# Patient Record
Sex: Female | Born: 1955 | Race: Black or African American | Hispanic: No | Marital: Single | Smoking: Former smoker
Health system: Southern US, Community
[De-identification: ages and names within clinical notes are randomized; demographics above are authoritative.]

## PROBLEM LIST (undated history)

## (undated) DIAGNOSIS — R059 Cough, unspecified: Secondary | ICD-10-CM

## (undated) DIAGNOSIS — R05 Cough: Secondary | ICD-10-CM

## (undated) DIAGNOSIS — S83209A Unspecified tear of unspecified meniscus, current injury, unspecified knee, initial encounter: Secondary | ICD-10-CM

## (undated) DIAGNOSIS — K219 Gastro-esophageal reflux disease without esophagitis: Secondary | ICD-10-CM

## (undated) DIAGNOSIS — I10 Essential (primary) hypertension: Secondary | ICD-10-CM

## (undated) DIAGNOSIS — T8859XA Other complications of anesthesia, initial encounter: Secondary | ICD-10-CM

## (undated) DIAGNOSIS — E041 Nontoxic single thyroid nodule: Secondary | ICD-10-CM

## (undated) DIAGNOSIS — M1990 Unspecified osteoarthritis, unspecified site: Secondary | ICD-10-CM

## (undated) DIAGNOSIS — E785 Hyperlipidemia, unspecified: Secondary | ICD-10-CM

## (undated) DIAGNOSIS — F419 Anxiety disorder, unspecified: Secondary | ICD-10-CM

## (undated) DIAGNOSIS — F329 Major depressive disorder, single episode, unspecified: Secondary | ICD-10-CM

## (undated) DIAGNOSIS — F32A Depression, unspecified: Secondary | ICD-10-CM

## (undated) HISTORY — DX: Unspecified tear of unspecified meniscus, current injury, unspecified knee, initial encounter: S83.209A

## (undated) HISTORY — DX: Unspecified osteoarthritis, unspecified site: M19.90

## (undated) HISTORY — DX: Hyperlipidemia, unspecified: E78.5

## (undated) HISTORY — PX: TONSILLECTOMY: SUR1361

## (undated) HISTORY — DX: Essential (primary) hypertension: I10

## (undated) HISTORY — DX: Depression, unspecified: F32.A

## (undated) HISTORY — DX: Cough: R05

## (undated) HISTORY — DX: Cough, unspecified: R05.9

## (undated) HISTORY — DX: Nontoxic single thyroid nodule: E04.1

## (undated) HISTORY — DX: Major depressive disorder, single episode, unspecified: F32.9

## (undated) HISTORY — DX: Anxiety disorder, unspecified: F41.9

---

## 2012-12-17 HISTORY — PX: BREAST EXCISIONAL BIOPSY: SUR124

## 2015-09-17 HISTORY — PX: BREAST BIOPSY: SHX20

## 2016-04-27 DIAGNOSIS — H16229 Keratoconjunctivitis sicca, not specified as Sjogren's, unspecified eye: Secondary | ICD-10-CM | POA: Insufficient documentation

## 2016-04-27 DIAGNOSIS — H5213 Myopia, bilateral: Secondary | ICD-10-CM | POA: Insufficient documentation

## 2016-04-27 DIAGNOSIS — H43813 Vitreous degeneration, bilateral: Secondary | ICD-10-CM | POA: Insufficient documentation

## 2016-04-27 DIAGNOSIS — H2511 Age-related nuclear cataract, right eye: Secondary | ICD-10-CM | POA: Insufficient documentation

## 2016-10-18 ENCOUNTER — Other Ambulatory Visit: Payer: Self-pay | Admitting: *Deleted

## 2016-10-18 ENCOUNTER — Ambulatory Visit
Admission: RE | Admit: 2016-10-18 | Discharge: 2016-10-18 | Disposition: A | Discharge status: Home / Self Care | Payer: Self-pay | Source: Ambulatory Visit | Attending: *Deleted | Admitting: *Deleted

## 2016-10-18 ENCOUNTER — Other Ambulatory Visit: Payer: Self-pay | Admitting: Internal Medicine

## 2016-10-18 DIAGNOSIS — Z1231 Encounter for screening mammogram for malignant neoplasm of breast: Secondary | ICD-10-CM

## 2016-11-20 DIAGNOSIS — R2689 Other abnormalities of gait and mobility: Secondary | ICD-10-CM | POA: Insufficient documentation

## 2016-11-20 DIAGNOSIS — G500 Trigeminal neuralgia: Secondary | ICD-10-CM | POA: Insufficient documentation

## 2016-11-22 ENCOUNTER — Ambulatory Visit
Admission: RE | Admit: 2016-11-22 | Discharge: 2016-11-22 | Disposition: A | Discharge status: Home / Self Care | Payer: BLUE CROSS/BLUE SHIELD | Source: Ambulatory Visit | Attending: Internal Medicine | Admitting: Internal Medicine

## 2016-11-22 DIAGNOSIS — Z1231 Encounter for screening mammogram for malignant neoplasm of breast: Secondary | ICD-10-CM | POA: Insufficient documentation

## 2016-12-12 ENCOUNTER — Other Ambulatory Visit: Payer: Self-pay | Admitting: Internal Medicine

## 2016-12-12 DIAGNOSIS — R1310 Dysphagia, unspecified: Secondary | ICD-10-CM

## 2016-12-20 ENCOUNTER — Ambulatory Visit: Payer: BLUE CROSS/BLUE SHIELD

## 2016-12-24 ENCOUNTER — Ambulatory Visit
Admission: RE | Admit: 2016-12-24 | Discharge: 2016-12-24 | Disposition: A | Discharge status: Home / Self Care | Payer: BLUE CROSS/BLUE SHIELD | Source: Ambulatory Visit | Attending: Internal Medicine | Admitting: Internal Medicine

## 2016-12-24 DIAGNOSIS — R1310 Dysphagia, unspecified: Secondary | ICD-10-CM | POA: Insufficient documentation

## 2016-12-24 DIAGNOSIS — K449 Diaphragmatic hernia without obstruction or gangrene: Secondary | ICD-10-CM | POA: Insufficient documentation

## 2017-01-22 ENCOUNTER — Other Ambulatory Visit: Payer: Self-pay | Admitting: Obstetrics and Gynecology

## 2017-01-22 DIAGNOSIS — N83201 Unspecified ovarian cyst, right side: Secondary | ICD-10-CM

## 2017-01-22 DIAGNOSIS — D259 Leiomyoma of uterus, unspecified: Secondary | ICD-10-CM

## 2017-01-22 DIAGNOSIS — R9389 Abnormal findings on diagnostic imaging of other specified body structures: Secondary | ICD-10-CM

## 2017-01-25 ENCOUNTER — Ambulatory Visit
Admission: RE | Admit: 2017-01-25 | Discharge: 2017-01-25 | Disposition: A | Discharge status: Home / Self Care | Payer: BLUE CROSS/BLUE SHIELD | Source: Ambulatory Visit | Attending: Obstetrics and Gynecology | Admitting: Obstetrics and Gynecology

## 2017-01-25 DIAGNOSIS — N83201 Unspecified ovarian cyst, right side: Secondary | ICD-10-CM | POA: Diagnosis not present

## 2017-01-25 DIAGNOSIS — R938 Abnormal findings on diagnostic imaging of other specified body structures: Secondary | ICD-10-CM | POA: Diagnosis present

## 2017-01-25 DIAGNOSIS — N83202 Unspecified ovarian cyst, left side: Secondary | ICD-10-CM | POA: Insufficient documentation

## 2017-01-25 DIAGNOSIS — D259 Leiomyoma of uterus, unspecified: Secondary | ICD-10-CM | POA: Diagnosis present

## 2017-01-25 DIAGNOSIS — R9389 Abnormal findings on diagnostic imaging of other specified body structures: Secondary | ICD-10-CM

## 2017-02-20 ENCOUNTER — Telehealth: Payer: Self-pay | Admitting: Obstetrics and Gynecology

## 2017-02-20 ENCOUNTER — Ambulatory Visit (INDEPENDENT_AMBULATORY_CARE_PROVIDER_SITE_OTHER): Payer: BLUE CROSS/BLUE SHIELD | Admitting: Obstetrics and Gynecology

## 2017-02-20 ENCOUNTER — Encounter: Payer: Self-pay | Admitting: Obstetrics and Gynecology

## 2017-02-20 VITALS — BP 128/74 | Ht 60.0 in | Wt 151.0 lb

## 2017-02-20 DIAGNOSIS — D259 Leiomyoma of uterus, unspecified: Secondary | ICD-10-CM | POA: Insufficient documentation

## 2017-02-20 DIAGNOSIS — D219 Benign neoplasm of connective and other soft tissue, unspecified: Secondary | ICD-10-CM | POA: Diagnosis not present

## 2017-02-20 DIAGNOSIS — N83201 Unspecified ovarian cyst, right side: Secondary | ICD-10-CM | POA: Diagnosis not present

## 2017-02-20 DIAGNOSIS — N83202 Unspecified ovarian cyst, left side: Secondary | ICD-10-CM

## 2017-02-20 NOTE — Telephone Encounter (Signed)
Patient is scheduled at [facility] on Tuesday, 05/14/17 @ 2:30pm, arrive at [facility] @ 2:00pm, begin drinking 32 oz of water at 1:30pm and arrive w/ a full bladder. Lmtrc.

## 2017-02-20 NOTE — Progress Notes (Signed)
Gynecology Ultrasound Follow Up  Chief Complaint:  Chief Complaint  Patient presents with  . Follow-up    U/S Follow Up for Ovarian Cyst   History of Present Illness: Patient is a 61 y.o. female who presents today for ultrasound evaluation of her ovarian cyst and uterine fibroids.  Ultrasound demonstrates the following findgins Adnexa: cyst seen  Right ovary: 8.4 x 8.7 x 8.4 cm simple appearing cyst  Left ovary: 4.3 x 2.9 x 3.8 cm simple appearing cyst Uterus: several fibroids, largest is 8.0 x 7.1 x 6.3 cm.  Several other 2-3cm fibroids mentioned in report.  Historical (from records reviewed): 07/17/13: Ultrasound report  largest fibroid 8 x 5.6 x 7.1 cm  Simple cyst noted (laterality not specified): 7.3 cm 11/10/13: ultrasound report  Fibroids: 4.6 x 5.4 cm, 1.8 x 2.0 xcm, 5.4 x 6.8 cm  No cysts mentioned 11/26/2016: CT report  Fibroid: 7.7 x 6.7 x 7 cm  Cyst (possibly from right ovary): 7.5 x 7.5 x 7.7 cm  No new symptoms  Review of Systems: Review of Systems  Constitutional: Negative.   HENT: Negative.   Eyes: Negative.   Respiratory: Negative.   Cardiovascular: Negative.   Gastrointestinal: Negative.   Genitourinary: Negative.   Musculoskeletal: Negative.   Skin: Negative.   Neurological: Negative.   Psychiatric/Behavioral: Negative.    Past Medical History:  Diagnosis Date  . Anxiety   . Hyperlipidemia   . Hypertension    Past Surgical History:  Procedure Laterality Date  . BREAST BIOPSY Left 09/2015   [place] core bx  NEG   . BREAST BIOPSY Right    both core and excisional done, multiples  . BREAST BIOPSY Left 2014  . CESAREAN SECTION    . TONSILLECTOMY     No LMP recorded. Patient is postmenopausal. Contraception: n/a  Family History  Problem Relation Age of Onset  . Diabetes Maternal Aunt   . Hypertension Maternal Aunt   . Breast cancer Neg Hx    Social History   Social History  . Marital status: Single    Spouse name: N/A  . Number of  children: N/A  . Years of education: N/A   Occupational History  . Not on file.   Social History Main Topics  . Smoking status: Never Smoker  . Smokeless tobacco: Never Used  . Alcohol use No  . Drug use: No  . Sexual activity: Not on file   Other Topics Concern  . Not on file   Social History Narrative  . No narrative on file   Allergies  Allergen Reactions  . Penicillins     Medications:   Medication Sig Start Date End Date Taking? Authorizing Provider  meclizine (ANTIVERT) 25 MG tablet TK 1 T PO BID TO TID PRF VERTIGO 04/19/16  Yes Historical Provider, MD  meloxicam (MOBIC) 15 MG tablet Meloxicam 15MG  Oral Tablet QTY: 30 tablet Days: 30 Refills: 3  Written: 09/21/16 Patient Instructions: once a day 09/21/16  Yes Historical Provider, MD  valACYclovir (VALTREX) 500 MG tablet Take by mouth. 01/19/16  Yes Historical Provider, MD  chlorthalidone (HYGROTON) 25 MG tablet TK 1 T PO  ONCE A DAY 01/14/17   Historical Provider, MD  enalapril (VASOTEC) 10 MG tablet Take by mouth.    Historical Provider, MD  gabapentin (NEURONTIN) 100 MG capsule TK 1 C PO BID 02/07/17   Historical Provider, MD  polyethylene glycol powder (GLYCOLAX/MIRALAX) powder USE UTD FOR COLONOSCOPY 01/21/17   Historical Provider, MD  pravastatin (PRAVACHOL) 40 MG tablet Take by mouth.    Historical Provider, MD  RESTASIS MULTIDOSE 0.05 % ophthalmic emulsion PLACE ONE GTT INTO BOTH EYES BID 12/13/16   Historical Provider, MD  sertraline (ZOLOFT) 100 MG tablet  01/15/17   Historical Provider, MD    Physical Exam BP 128/74   Ht 5' (1.524 m)   Wt 151 lb (68.5 kg)   BMI 29.49 kg/m   General: NAD HEENT: normocephalic, anicteric Pulmonary: No increased work of breathing Extremities: no edema, erythema, or tenderness Neurologic: Grossly intact, normal gait Psychiatric: mood appropriate, affect full  Assessment: 62 y.o. G1P0101 with large, fibroid uterus with multiple fibroids, ovarian cyst which appears  simple. Plan:  Discussed that overall, compared to studies from 2014, her uterine fibroids are essentially unchanged. We discussed that monitoring them at this point makes the most sense, especially given that she very much does not want to have surgery. Her cyst is slightly different. She has a history of ovarian cysts with sizes similar to recent studies. I am not able to tell whether the cyst she has currently are persistent or are new cyst. After discussing the risks and benefits of monitoring versus removing the cysts, mutual decision is made to repeat the ultrasound in 3 months with the understanding that I would recommend removal if they are persistent. She voiced understanding and agreement with this.  25 minutes spent in face to face discussion with > 50% spent in counseling and management of her uterine fibroids and ovarian cysts.   Prentice Docker, MD 02/20/2017 1:21 PM    CC: Lamonte Sakai, MD [place]

## 2017-02-20 NOTE — Telephone Encounter (Signed)
Patient returned the call and is aware of appointment details and instructions.

## 2017-02-27 ENCOUNTER — Telehealth: Payer: Self-pay | Admitting: Obstetrics and Gynecology

## 2017-02-27 NOTE — Telephone Encounter (Signed)
I'm confused.  If we're doing a six-month follow up from November, then having another u/s in May makes sense. When I talked with her in the clinic recently we discussed doing the ultrasound either 6 months from November or  6 months from February.  I would prefer that the ultrasound be 6 months from November because that is when we had the first imaging that showed the cyst.  I think it would be fine either way, though. I just want to be sure I am following her closely.   Please give her a call back and see if she prefers to move it to August.  Like I said, my preference is May.   Thanks,  Prentice Docker, MD

## 2017-02-27 NOTE — Telephone Encounter (Signed)
My records show two u/s ordered for May (abdominal and pelvic).  Both should be ordered.  One is a pelvis-complete (transabdominal) and transvaginal.  These are the same ultrasounds ordered for 2/9 and the results were what I needed. So, they should be the right ones.

## 2017-02-27 NOTE — Telephone Encounter (Signed)
May is correct since 6 mos from November. Her question is coming from having an abdominal u/s in November and we have her scheduled for a pelvic u/s in May.

## 2017-02-27 NOTE — Telephone Encounter (Signed)
Patient called to question whether the order for 05/14/17 pelvic u/s is correct. She said she had abdominal imaging last November and a pelvic u/s in February. She thought you mentioned a 6 mos f/u but that would be from the November imaging. Please contact the patient at [phone]. Please let me know if the order is changed so I can reschedule the patient.

## 2017-02-28 NOTE — Telephone Encounter (Signed)
Lmtrc

## 2017-02-28 NOTE — Telephone Encounter (Signed)
Patient returned the call and is aware of the tests ordered. Also, patient said she will be travelling and needs to reschedule the appointment for later that same week.

## 2017-03-01 NOTE — Telephone Encounter (Signed)
Lmtrc

## 2017-03-01 NOTE — Telephone Encounter (Signed)
Patient has been rescheduled for Friday, 05/17/17 @ 2:30pm with a 2:15pm arrival at the [facility]. Lmtrc.

## 2017-03-01 NOTE — Telephone Encounter (Signed)
Patient returned call and is aware of rescheduled appointment on Friday, 05/17/17 @ 2:30pm with a 2:15pm arrival at the [place], and to begin drinking 32 oz water 1 hr before the test and arrive with a full bladder.

## 2017-04-30 ENCOUNTER — Other Ambulatory Visit: Payer: Self-pay | Admitting: Neurology

## 2017-04-30 DIAGNOSIS — R2689 Other abnormalities of gait and mobility: Secondary | ICD-10-CM

## 2017-04-30 DIAGNOSIS — G500 Trigeminal neuralgia: Secondary | ICD-10-CM

## 2017-05-14 ENCOUNTER — Ambulatory Visit: Payer: BLUE CROSS/BLUE SHIELD

## 2017-05-17 ENCOUNTER — Ambulatory Visit: Payer: BLUE CROSS/BLUE SHIELD

## 2017-05-23 ENCOUNTER — Ambulatory Visit: Payer: BLUE CROSS/BLUE SHIELD

## 2017-05-24 ENCOUNTER — Ambulatory Visit: Payer: BLUE CROSS/BLUE SHIELD | Admitting: Obstetrics and Gynecology

## 2017-05-27 ENCOUNTER — Ambulatory Visit
Admission: RE | Admit: 2017-05-27 | Discharge: 2017-05-27 | Disposition: A | Discharge status: Home / Self Care | Payer: BLUE CROSS/BLUE SHIELD | Source: Ambulatory Visit | Attending: Obstetrics and Gynecology | Admitting: Obstetrics and Gynecology

## 2017-05-27 DIAGNOSIS — D219 Benign neoplasm of connective and other soft tissue, unspecified: Secondary | ICD-10-CM | POA: Diagnosis present

## 2017-05-27 DIAGNOSIS — N838 Other noninflammatory disorders of ovary, fallopian tube and broad ligament: Secondary | ICD-10-CM | POA: Insufficient documentation

## 2017-05-27 DIAGNOSIS — N83201 Unspecified ovarian cyst, right side: Secondary | ICD-10-CM | POA: Diagnosis present

## 2017-05-27 DIAGNOSIS — N83202 Unspecified ovarian cyst, left side: Secondary | ICD-10-CM | POA: Diagnosis not present

## 2017-05-28 ENCOUNTER — Ambulatory Visit: Payer: BLUE CROSS/BLUE SHIELD

## 2017-05-30 ENCOUNTER — Encounter: Payer: Self-pay | Admitting: Obstetrics and Gynecology

## 2017-05-30 ENCOUNTER — Ambulatory Visit (INDEPENDENT_AMBULATORY_CARE_PROVIDER_SITE_OTHER): Payer: BLUE CROSS/BLUE SHIELD | Admitting: Obstetrics and Gynecology

## 2017-05-30 VITALS — BP 128/84 | Ht 60.0 in | Wt 160.0 lb

## 2017-05-30 DIAGNOSIS — N83201 Unspecified ovarian cyst, right side: Secondary | ICD-10-CM | POA: Diagnosis not present

## 2017-05-30 DIAGNOSIS — D219 Benign neoplasm of connective and other soft tissue, unspecified: Secondary | ICD-10-CM | POA: Diagnosis not present

## 2017-05-30 NOTE — Progress Notes (Signed)
Gynecology Ultrasound Follow Up   Chief Complaint  Patient presents with  . Follow-up  Uterine fibroids and ovarian cyst   History of Present Illness: Patient is a 61 y.o. female who presents today for ultrasound evaluation of follow up ultrasound of uterine fibroid and ovarian cyst.  ===Pelvic Ultrasound REPORT from 05/27/17=== Measurements: 12.5 x 7.9 x 9.2 cm. Multiple uterine fibroids, including a dominant 7.4 x 8.2 x 5.7 cm intramural fibroid along the right posterior uterine body, previously 8.0 cm, grossly unchanged. Additional smaller uterine fibroids measuring up to 4.7 cm in the left anterior uterine body.  Endometrium  Not discretely visualized/distorted by the dominant fibroid.  Right ovary  Not definitely visualized. 7.7 x 7.6 x 7.5 cm simple cystic lesion of the right of midline superior to the uterus, previously 8.7 cm, possibly reflecting a right ovarian cyst. However, no definite rim of ovarian tissue is demonstrated.  Left ovary  Measurements: 5.5 x 4.4 x 4.6 cm. 4.4 x 4.2 x 3.9 cm simple cyst/follicle, previously 4.3 cm, grossly unchanged.  Other findings  No abnormal free fluid.  IMPRESSION: Multiple uterine fibroids, including a dominant 8.2 cm intramural fibroid in the right posterior uterine body, grossly unchanged.  7.7 cm simple cystic lesion in the right adnexa, previously 8.7 cm. No definite surrounding right ovarian tissue.  4.4 cm simple left ovarian cyst/follicle, grossly unchanged. ==end report== Compared to previous imaging, there is not significant change. See my clinic note dated 02/20/17 for details from prior imagine.   She denies any symptoms of abdominal pain, vaginal bleeding, weight loss, bloating, constipation.   Review of Systems: Review of Systems  Constitutional: Negative.   HENT: Negative.   Eyes: Negative.   Respiratory: Negative.   Cardiovascular: Negative.   Gastrointestinal: Negative.     Genitourinary: Negative.   Musculoskeletal: Negative.   Skin: Negative.   Neurological: Negative.   Psychiatric/Behavioral: Negative.     Past Medical History:  Diagnosis Date  . Anxiety   . Hyperlipidemia   . Hypertension     Past Surgical History:  Procedure Laterality Date  . BREAST BIOPSY Left 09/2015   [place] core bx  NEG   . BREAST BIOPSY Right    both core and excisional done, multiples  . BREAST BIOPSY Left 2014  . CESAREAN SECTION    . TONSILLECTOMY      Gynecologic History: postmenopausal  Family History  Problem Relation Age of Onset  . Diabetes Maternal Aunt   . Hypertension Maternal Aunt   . Breast cancer Neg Hx     Social History   Social History  . Marital status: Single    Spouse name: N/A  . Number of children: N/A  . Years of education: N/A   Occupational History  . Not on file.   Social History Main Topics  . Smoking status: Never Smoker  . Smokeless tobacco: Never Used  . Alcohol use No  . Drug use: No  . Sexual activity: Not on file   Other Topics Concern  . Not on file   Social History Narrative  . No narrative on file    Allergies  Allergen Reactions  . Penicillin G     Other reaction(s): Unknown    Medications:   Medication Sig Start Date End Date Taking? Authorizing Provider  enalapril (VASOTEC) 10 MG tablet Take by mouth.   Yes [provider]  gabapentin (NEURONTIN) 100 MG capsule TK 1 C PO BID 02/07/17  Yes [provider]  meclizine (ANTIVERT) 25 MG tablet TK 1 T PO BID TO TID PRF VERTIGO 04/19/16  Yes [provider]  meloxicam (MOBIC) 15 MG tablet Meloxicam 15MG  Oral Tablet QTY: 30 tablet Days: 30 Refills: 3  Written: 09/21/16 Patient Instructions: once a day 09/21/16  Yes [provider]  polyethylene glycol powder (GLYCOLAX/MIRALAX) powder USE UTD FOR COLONOSCOPY 01/21/17  Yes [provider]  pravastatin (PRAVACHOL) 40 MG tablet Take by mouth.   Yes [provider]   pregabalin (LYRICA) 150 MG capsule Take 150 mg by mouth 2 (two) times daily.   Yes [provider]  RESTASIS MULTIDOSE 0.05 % ophthalmic emulsion PLACE ONE GTT INTO BOTH EYES BID 12/13/16  Yes [provider]  sertraline (ZOLOFT) 100 MG tablet  01/15/17  Yes [provider]  valACYclovir (VALTREX) 500 MG tablet Take by mouth. 01/19/16  Yes [provider]  chlorthalidone (HYGROTON) 25 MG tablet TK 1 T PO  ONCE A DAY 01/14/17   [provider]    Physical Exam Vitals: Blood pressure 128/84, height 5' (1.524 m), weight 160 lb (72.6 kg).  General: NAD HEENT: normocephalic, anicteric Pulmonary: No increased work of breathing Extremities: no edema, erythema, or tenderness Neurologic: Grossly intact, normal gait Psychiatric: mood appropriate, affect full   Assessment: 61 y.o. G1P0101 with a persistent fibroid and right ovarian cyst.  Plan: Problem List Items Addressed This Visit    Leiomyoma   Cyst of right ovary - Primary     I spent a great deal of time in face-to-face discussion with this patient regarding her ovarian cyst.  It has been stable over what appears to be several years.  Her fibroid is also stable in size.  She has no symptoms and she has no desire to go to the operating room. I discussed that while it was reassuring that neither her cyst nor her fibroid had changed, I could not definitively tell her what was going on without removing the cyst.  She had a normal CA 125 on 01/18/17. 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  Her desire is to continue to monitor.   For now, will continue to monitor at 39-month intervals, possibly extending for longer given how long the cyst has been present.  She understands my recommendations.     20 minutes spent in face to face discussion with > 50% spent in counseling and management of her fibroid and ovarian cyst.   Prentice Docker, MD 05/31/2017 7:12 PM

## 2017-06-04 ENCOUNTER — Ambulatory Visit
Admission: RE | Admit: 2017-06-04 | Discharge: 2017-06-04 | Disposition: A | Discharge status: Home / Self Care | Payer: BLUE CROSS/BLUE SHIELD | Source: Ambulatory Visit | Attending: Neurology | Admitting: Neurology

## 2017-06-04 DIAGNOSIS — I6782 Cerebral ischemia: Secondary | ICD-10-CM | POA: Insufficient documentation

## 2017-06-04 DIAGNOSIS — G500 Trigeminal neuralgia: Secondary | ICD-10-CM | POA: Diagnosis present

## 2017-06-04 DIAGNOSIS — R2689 Other abnormalities of gait and mobility: Secondary | ICD-10-CM | POA: Insufficient documentation

## 2017-06-04 MED ORDER — GADOBENATE DIMEGLUMINE 529 MG/ML IV SOLN
10.0000 mL | Freq: Once | INTRAVENOUS | Status: AC | PRN
  Administered 2017-06-04: 7 mL via INTRAVENOUS

## 2017-07-24 DIAGNOSIS — R251 Tremor, unspecified: Secondary | ICD-10-CM | POA: Insufficient documentation

## 2017-07-24 DIAGNOSIS — G5712 Meralgia paresthetica, left lower limb: Secondary | ICD-10-CM | POA: Insufficient documentation

## 2017-07-24 DIAGNOSIS — G629 Polyneuropathy, unspecified: Secondary | ICD-10-CM | POA: Insufficient documentation

## 2017-09-04 ENCOUNTER — Other Ambulatory Visit: Payer: Self-pay | Admitting: Internal Medicine

## 2017-09-04 DIAGNOSIS — M25561 Pain in right knee: Secondary | ICD-10-CM

## 2017-09-10 ENCOUNTER — Ambulatory Visit
Admission: RE | Admit: 2017-09-10 | Discharge: 2017-09-10 | Disposition: A | Discharge status: Home / Self Care | Payer: BLUE CROSS/BLUE SHIELD | Source: Ambulatory Visit | Attending: Internal Medicine | Admitting: Internal Medicine

## 2017-09-10 ENCOUNTER — Encounter: Payer: Self-pay | Admitting: Internal Medicine

## 2017-09-10 DIAGNOSIS — M2410 Other articular cartilage disorders, unspecified site: Secondary | ICD-10-CM | POA: Insufficient documentation

## 2017-09-10 DIAGNOSIS — M7651 Patellar tendinitis, right knee: Secondary | ICD-10-CM | POA: Insufficient documentation

## 2017-09-10 DIAGNOSIS — M25561 Pain in right knee: Secondary | ICD-10-CM | POA: Diagnosis present

## 2017-09-10 DIAGNOSIS — M23221 Derangement of posterior horn of medial meniscus due to old tear or injury, right knee: Secondary | ICD-10-CM | POA: Insufficient documentation

## 2017-09-10 DIAGNOSIS — M25461 Effusion, right knee: Secondary | ICD-10-CM | POA: Insufficient documentation

## 2017-11-20 DIAGNOSIS — R5382 Chronic fatigue, unspecified: Secondary | ICD-10-CM | POA: Insufficient documentation

## 2017-11-20 DIAGNOSIS — Z0189 Encounter for other specified special examinations: Secondary | ICD-10-CM | POA: Insufficient documentation

## 2017-12-09 ENCOUNTER — Other Ambulatory Visit: Payer: Self-pay

## 2017-12-09 ENCOUNTER — Encounter: Payer: Self-pay | Admitting: Emergency Medicine

## 2017-12-09 ENCOUNTER — Emergency Department
Admission: EM | Admit: 2017-12-09 | Discharge: 2017-12-09 | Disposition: A | Discharge status: Home / Self Care | Payer: BLUE CROSS/BLUE SHIELD | Attending: Emergency Medicine | Admitting: Emergency Medicine

## 2017-12-09 DIAGNOSIS — M545 Low back pain, unspecified: Secondary | ICD-10-CM

## 2017-12-09 DIAGNOSIS — I10 Essential (primary) hypertension: Secondary | ICD-10-CM | POA: Insufficient documentation

## 2017-12-09 DIAGNOSIS — Z79899 Other long term (current) drug therapy: Secondary | ICD-10-CM | POA: Insufficient documentation

## 2017-12-09 DIAGNOSIS — N390 Urinary tract infection, site not specified: Secondary | ICD-10-CM | POA: Diagnosis not present

## 2017-12-09 DIAGNOSIS — Z791 Long term (current) use of non-steroidal anti-inflammatories (NSAID): Secondary | ICD-10-CM | POA: Insufficient documentation

## 2017-12-09 LAB — URINALYSIS, COMPLETE (UACMP) WITH MICROSCOPIC
BILIRUBIN URINE: NEGATIVE
Glucose, UA: NEGATIVE mg/dL
HGB URINE DIPSTICK: NEGATIVE
Ketones, ur: NEGATIVE mg/dL
NITRITE: NEGATIVE
PROTEIN: NEGATIVE mg/dL
Specific Gravity, Urine: 1.011 (ref 1.005–1.030)
pH: 5 (ref 5.0–8.0)

## 2017-12-09 MED ORDER — SULFAMETHOXAZOLE-TRIMETHOPRIM 800-160 MG PO TABS: 1.0000 | ORAL_TABLET | Freq: Two times a day (BID) | ORAL | 0 refills | Status: DC

## 2017-12-09 MED ORDER — HYDROCODONE-ACETAMINOPHEN 5-325 MG PO TABS: 1.0000 | ORAL_TABLET | Freq: Four times a day (QID) | ORAL | 0 refills | Status: DC | PRN

## 2017-12-09 MED ORDER — TRAMADOL HCL 50 MG PO TABS: 50.0000 mg | ORAL_TABLET | Freq: Four times a day (QID) | ORAL | 0 refills | Status: DC | PRN

## 2017-12-09 NOTE — ED Provider Notes (Signed)
[facility] Emergency Department Provider Note  ___________________________________________   First MD Initiated Contact with Patient 12/09/17 [phone]     (approximate)  I have reviewed the triage vital signs and the nursing notes.   HISTORY  Chief Complaint Back Pain   HPI Amanda Hardin is a 61 y.o. female is here complaining of low back pain for one week. Patient states that she thought that this was from having physical therapy on her knee. She states that she took ibuprofen prior to to arrival at approximately 8 AM this morning. She denies any urinary symptoms or paresthesias. She continues to ambulate without assistance.she rates her pain as an 8 out of 10.   Past Medical History:  Diagnosis Date  . Anxiety   . Hyperlipidemia   . Hypertension     Patient Active Problem List   Diagnosis Date Noted  . Leiomyoma 02/20/2017  . Cyst of right ovary 02/20/2017    Past Surgical History:  Procedure Laterality Date  . BREAST BIOPSY Left 09/2015   [place] core bx  NEG   . BREAST BIOPSY Right    both core and excisional done, multiples  . BREAST BIOPSY Left 2014  . CESAREAN SECTION    . TONSILLECTOMY      Prior to Admission medications   Medication Sig Start Date End Date Taking? Authorizing Provider  chlorthalidone (HYGROTON) 25 MG tablet TK 1 T PO  ONCE A DAY 01/14/17   [provider]  enalapril (VASOTEC) 10 MG tablet Take by mouth.    [provider]  gabapentin (NEURONTIN) 100 MG capsule TK 1 C PO BID 02/07/17   [provider]  meclizine (ANTIVERT) 25 MG tablet TK 1 T PO BID TO TID PRF VERTIGO 04/19/16   [provider]  meloxicam (MOBIC) 15 MG tablet Meloxicam 15MG  Oral Tablet QTY: 30 tablet Days: 30 Refills: 3  Written: 09/21/16 Patient Instructions: once a day 09/21/16   [provider]  polyethylene glycol powder (GLYCOLAX/MIRALAX) powder USE UTD FOR COLONOSCOPY 01/21/17   [provider]    pravastatin (PRAVACHOL) 40 MG tablet Take by mouth.    [provider]  pregabalin (LYRICA) 150 MG capsule Take 150 mg by mouth 2 (two) times daily.    [provider]  RESTASIS MULTIDOSE 0.05 % ophthalmic emulsion PLACE ONE GTT INTO BOTH EYES BID 12/13/16   [provider]  sertraline (ZOLOFT) 100 MG tablet  01/15/17   [provider]  sulfamethoxazole-trimethoprim (BACTRIM DS,SEPTRA DS) 800-160 MG tablet Take 1 tablet by mouth 2 (two) times daily. 12/09/17   Johnn Hai, PA-C  traMADol (ULTRAM) 50 MG tablet Take 1 tablet (50 mg total) by mouth every 6 (six) hours as needed. 12/09/17 12/09/18  Johnn Hai, PA-C  valACYclovir (VALTREX) 500 MG tablet Take by mouth. 01/19/16   [provider]    Allergies Norco [hydrocodone-acetaminophen] and Penicillin g  Family History  Problem Relation Age of Onset  . Diabetes Maternal Aunt   . Hypertension Maternal Aunt   . Breast cancer Neg Hx     Social History Social History   Tobacco Use  . Smoking status: Never Smoker  . Smokeless tobacco: Never Used  Substance Use Topics  . Alcohol use: No  . Drug use: No    Review of Systems Constitutional: No fever/chills Cardiovascular: Denies chest pain. Respiratory: Denies shortness of breath. Gastrointestinal: No abdominal pain.  No nausea, no vomiting.  No diarrhea.  No constipation. Genitourinary:  Negative for dysuria. Musculoskeletal: positive for low back pain. Skin: Negative for rash. Neurological: Negative for headaches, focal weakness or numbness. ____________________________________________   PHYSICAL EXAM:  VITAL SIGNS: ED Triage Vitals [12/09/17 0909]  Enc Vitals Group     BP (!) 146/74     Pulse Rate 74     Resp 20     Temp 97.7 F (36.5 C)     Temp Source Oral     SpO2 99 %     Weight 160 lb (72.6 kg)     Height      Head Circumference      Peak Flow      Pain Score 8     Pain Loc      Pain Edu?      Excl. in  [place]?    Constitutional: Alert and oriented. Well appearing and in no acute distress. Eyes: Conjunctivae are normal.  Head: Atraumatic. Neck: No stridor.   Cardiovascular: Normal rate, regular rhythm. Grossly normal heart sounds.  Good peripheral circulation. Respiratory: Normal respiratory effort.  No retractions. Lungs CTAB. Gastrointestinal: Soft and nontender. No distention.  No CVA tenderness. Musculoskeletal: on examination of the back there is some point tenderness at approximately L5-S1 area and paravertebral muscles bilaterally. There is some minimal SI joint tenderness bilaterally. Patient is able to get from seated position to standing without assistance. Neurologic:  Normal speech and language. No gross focal neurologic deficits are appreciated. No gait instability. Skin:  Skin is warm, dry and intact. No rash noted. Psychiatric: Mood and affect are normal. Speech and behavior are normal.  ____________________________________________   LABS (all labs ordered are listed, but only abnormal results are displayed)  Labs Reviewed  URINALYSIS, COMPLETE (UACMP) WITH MICROSCOPIC - Abnormal; Notable for the following components:      Result Value   Color, Urine YELLOW (*)    APPearance HAZY (*)    Leukocytes, UA TRACE (*)    Bacteria, UA MANY (*)    Squamous Epithelial / LPF 6-30 (*)    All other components within normal limits    PROCEDURES  Procedure(s) performed: None  Procedures  Critical Care performed: No  ____________________________________________   INITIAL IMPRESSION / ASSESSMENT AND PLAN / ED COURSE Patient was told she also has a urinary tract infection. She was given a prescription for Bactrim DS twice a day for 10 days and tramadol 1 every 6 hours as needed for back pain. She will follow-up with her PCP if any continued problems. She is encouraged to drink lots of fluids.  ____________________________________________   FINAL CLINICAL IMPRESSION(S) / ED  DIAGNOSES  Final diagnoses:  Acute urinary tract infection  Acute bilateral low back pain without sciatica     ED Discharge Orders        Ordered    HYDROcodone-acetaminophen (NORCO/VICODIN) 5-325 MG tablet  Every 6 hours PRN,   Status:  Discontinued     12/09/17 1042    sulfamethoxazole-trimethoprim (BACTRIM DS,SEPTRA DS) 800-160 MG tablet  2 times daily,   Status:  Discontinued     12/09/17 1042    traMADol (ULTRAM) 50 MG tablet  Every 6 hours PRN,   Status:  Discontinued     12/09/17 1053    sulfamethoxazole-trimethoprim (BACTRIM DS,SEPTRA DS) 800-160 MG tablet  2 times daily     12/09/17 1055    traMADol (ULTRAM) 50 MG tablet  Every 6 hours PRN     12/09/17 1055       Note:  This document was prepared using Dragon voice recognition software and may include unintentional dictation errors.    Johnn Hai, PA-C 12/09/17 [place], Randall An, MD 12/09/17 [phone]

## 2017-12-09 NOTE — Discharge Instructions (Addendum)
Begin taking antibiotics as directed twice a day for the next 10 days. Continue taking ibuprofen as needed for your back. You may also take tramadol one every 6 hours if needed for severe pain. Use ice or heat to your back as needed for discomfort. Follow-up with [facility] acute-care if any continued problems over the weekend.

## 2017-12-09 NOTE — ED Triage Notes (Signed)
Pt with low back pain for one week

## 2017-12-09 NOTE — ED Notes (Signed)
See triage note  States she is having lower back pain which is moving into both hips  Pain started during PT last Thursday  Ambulates well to treatment

## 2018-01-06 ENCOUNTER — Ambulatory Visit: Payer: Self-pay | Admitting: Obstetrics and Gynecology

## 2018-01-08 ENCOUNTER — Ambulatory Visit (INDEPENDENT_AMBULATORY_CARE_PROVIDER_SITE_OTHER): Payer: BLUE CROSS/BLUE SHIELD | Admitting: Obstetrics and Gynecology

## 2018-01-08 ENCOUNTER — Encounter: Payer: Self-pay | Admitting: Obstetrics and Gynecology

## 2018-01-08 VITALS — BP 124/74 | Ht 61.0 in | Wt 170.0 lb

## 2018-01-08 DIAGNOSIS — N83201 Unspecified ovarian cyst, right side: Secondary | ICD-10-CM

## 2018-01-08 DIAGNOSIS — Z1331 Encounter for screening for depression: Secondary | ICD-10-CM | POA: Diagnosis not present

## 2018-01-08 DIAGNOSIS — Z124 Encounter for screening for malignant neoplasm of cervix: Secondary | ICD-10-CM

## 2018-01-08 DIAGNOSIS — Z87448 Personal history of other diseases of urinary system: Secondary | ICD-10-CM

## 2018-01-08 DIAGNOSIS — Z01419 Encounter for gynecological examination (general) (routine) without abnormal findings: Secondary | ICD-10-CM | POA: Diagnosis not present

## 2018-01-08 DIAGNOSIS — R1032 Left lower quadrant pain: Secondary | ICD-10-CM | POA: Diagnosis not present

## 2018-01-08 DIAGNOSIS — D219 Benign neoplasm of connective and other soft tissue, unspecified: Secondary | ICD-10-CM

## 2018-01-08 DIAGNOSIS — Z1339 Encounter for screening examination for other mental health and behavioral disorders: Secondary | ICD-10-CM | POA: Diagnosis not present

## 2018-01-08 NOTE — Progress Notes (Signed)
Routine Annual Gynecology Examination   PCP: Patient, No Pcp Per  Chief Complaint  Patient presents with  . Annual Exam   History of Present Illness: Patient is a 62 y.o. G1P0101 presents for annual exam. The patient has no complaints today.   Menopausal bleeding: denies  Menopausal symptoms: denies  Breast symptoms: denies  Last pap smear: Unsure how long ago. Thinks maybe one year. Result Normal  Last mammogram: 1 years ago.  Result Normal   History of ovarian cysts and uterine fibroids.  Last ultrasound in June 2018 with noted stability of cyst (see prior notes for details).   She has had left lower quadrant abdominal pain for the past month. She was diagnosed with a UTI around this time, but the pain was after.  The pain was constant for about 2-3 weeks.  The pain was aching and annoying. The pain did not radiate.  She rates the pain 7/10 (lower today). Pressure makes it worse. Nothing makes it better.  She had no associated symptoms.    She also notes some difficultly with urinating.  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  She states that it has been a while since she has had a dilation procedure.  She does have some trouble with voiding in that she has trouble starting and will have post-void "trickling" and dribbling.  She would like a referral to a urologist.    Past Medical History:  Diagnosis Date  . Anxiety   . Hyperlipidemia   . Hypertension     Past Surgical History:  Procedure Laterality Date  . BREAST BIOPSY Left 09/2015   [place] core bx  NEG   . BREAST BIOPSY Right    both core and excisional done, multiples  . BREAST BIOPSY Left 2014  . CESAREAN SECTION    . TONSILLECTOMY      Medications   Medication Sig Start Date End Date Taking? Authorizing Provider  chlorthalidone (HYGROTON) 25 MG tablet TK 1 T PO  ONCE A DAY 01/14/17  Yes [provider]    enalapril (VASOTEC) 10 MG tablet Take by mouth.   Yes [provider]  gabapentin (NEURONTIN) 100 MG capsule TK 1 C PO BID 02/07/17  Yes [provider]  meclizine (ANTIVERT) 25 MG tablet TK 1 T PO BID TO TID PRF VERTIGO 04/19/16  Yes [provider]  meloxicam (MOBIC) 15 MG tablet Meloxicam 15MG  Oral Tablet QTY: 30 tablet Days: 30 Refills: 3  Written: 09/21/16 Patient Instructions: once a day 09/21/16  Yes [provider]  polyethylene glycol powder (GLYCOLAX/MIRALAX) powder USE UTD FOR COLONOSCOPY 01/21/17  Yes [provider]  pravastatin (PRAVACHOL) 40 MG tablet Take by mouth.   Yes [provider]  pregabalin (LYRICA) 150 MG capsule Take 150 mg by mouth 2 (two) times daily.   Yes [provider]  RESTASIS MULTIDOSE 0.05 % ophthalmic emulsion PLACE ONE GTT INTO BOTH EYES BID 12/13/16  Yes [provider]  sertraline (ZOLOFT) 100 MG tablet  01/15/17  Yes [provider]  sulfamethoxazole-trimethoprim (BACTRIM DS,SEPTRA DS) 800-160 MG tablet Take 1 tablet by mouth 2 (two) times daily. 12/09/17  Yes Johnn Hai, PA-C  traMADol (ULTRAM) 50 MG tablet Take 1 tablet (50 mg total) by mouth every 6 (six) hours as needed. 12/09/17 12/09/18 Yes Summers, Rhonda L, PA-C  valACYclovir (VALTREX) 500 MG tablet Take by  mouth. 01/19/16  Yes [provider]    Allergies  Allergen Reactions  . Norco [Hydrocodone-Acetaminophen] Nausea And Vomiting  . Penicillin G     Other reaction(s): Unknown    Obstetric History: G1P0101  Social History   Socioeconomic History  . Marital status: Single    Spouse name: Not on file  . Number of children: Not on file  . Years of education: Not on file  . Highest education level: Not on file  Social Needs  . Financial resource strain: Not on file  . Food insecurity - worry: Not on file  . Food insecurity - inability: Not on file  . Transportation needs - medical: Not on file  .  Transportation needs - non-medical: Not on file  Occupational History  . Not on file  Tobacco Use  . Smoking status: Never Smoker  . Smokeless tobacco: Never Used  Substance and Sexual Activity  . Alcohol use: No  . Drug use: No  . Sexual activity: Not Currently    Birth control/protection: Post-menopausal  Other Topics Concern  . Not on file  Social History Narrative  . Not on file    Family History  Problem Relation Age of Onset  . Diabetes Maternal Aunt   . Hypertension Maternal Aunt   . Breast cancer Neg Hx     Review of Systems  Constitutional: Negative.   HENT: Negative.   Eyes: Negative.   Respiratory: Negative.   Cardiovascular: Negative.   Gastrointestinal: Positive for abdominal pain (LLQ, see HPI). Negative for blood in stool, constipation, diarrhea, heartburn, melena, nausea and vomiting.  Genitourinary:       See HPI  Musculoskeletal: Negative.   Skin: Negative.   Neurological: Negative.   Psychiatric/Behavioral: Negative.      Physical Exam Vitals: BP 124/74   Ht 5\' 1"  (1.549 m)   Wt 170 lb (77.1 kg)   BMI 32.12 kg/m   Physical Exam  Constitutional: She is oriented to person, place, and time. She appears well-developed and well-nourished. No distress.  Genitourinary: Pelvic exam was performed with patient supine. There is no rash, tenderness, lesion or injury on the right labia. There is no rash, tenderness, lesion or injury on the left labia. No erythema, tenderness or bleeding in the vagina. No signs of injury around the vagina. No vaginal discharge found. Right adnexum does not display mass, does not display tenderness and does not display fullness. Left adnexum does not display mass, does not display tenderness and does not display fullness. Cervix does not exhibit motion tenderness, lesion, discharge or polyp.   Uterus is enlarged. Uterus is not tender or exhibiting a mass.  Genitourinary Comments: Bimanual reveals enlarged uterus/adnexal complex.   This is consistent from prior.  Cells collected for pap smear.   HENT:  Head: Normocephalic and atraumatic.  Eyes: EOM are normal. No scleral icterus.  Neck: Normal range of motion. Neck supple. No thyromegaly present.  Cardiovascular: Normal rate and regular rhythm. Exam reveals no gallop and no friction rub.  No murmur heard. Pulmonary/Chest: Effort normal and breath sounds normal. No respiratory distress. She has no wheezes. She has no rales. Right breast exhibits no inverted nipple, no mass, no nipple discharge, no skin change and no tenderness. Left breast exhibits no inverted nipple, no mass, no nipple discharge, no skin change and no tenderness.  Abdominal: Soft. Bowel sounds are normal. She exhibits mass (enlarged uterus.  to level of umbilicus). She exhibits no distension. There is no tenderness. There  is no rebound and no guarding.  Musculoskeletal: Normal range of motion. She exhibits no edema or tenderness.  Lymphadenopathy:    She has no cervical adenopathy.       Right: No inguinal adenopathy present.       Left: No inguinal adenopathy present.  Neurological: She is alert and oriented to person, place, and time. No cranial nerve deficit.  Skin: Skin is warm and dry. No rash noted. No erythema.  Psychiatric: She has a normal mood and affect. Her behavior is normal. Judgment normal.    Female chaperone present for pelvic and breast  portions of the physical exam  Results: AUDIT Questionnaire (screen for alcoholism): 0 PHQ-9: 5   Assessment and Plan:  62 y.o. G38P0101 female here for routine annual gynecologic examination  Plan: Problem List Items Addressed This Visit      Genitourinary   Cyst of right ovary   Relevant Orders   US PELVIS (TRANSABDOMINAL ONLY)   US PELVIS TRANSVANGINAL NON-OB (TV ONLY)     Other   Leiomyoma   Relevant Orders   US PELVIS (TRANSABDOMINAL ONLY)   US PELVIS TRANSVANGINAL NON-OB (TV ONLY)    Other Visit Diagnoses    Women's annual  routine gynecological examination    -  Primary   Relevant Orders   IGP, Aptima HPV, rfx 16/18,45   Screening for depression       Screening for alcoholism       Pap smear for cervical cancer screening       Relevant Orders   IGP, Aptima HPV, rfx 16/18,45   H/O urethral stricture       Relevant Orders   Ambulatory referral to Urology   Left lower quadrant abdominal pain of unknown etiology          Screening: -- Blood pressure screen managed by PCP -- Colonoscopy - per PCP -- Mammogram - due. Patient to call [facility] to arrange. She understands that it is her responsibility to arrange this. -- Weight screening: obese: discussed management options, including lifestyle, dietary, and exercise. -- Depression screening negative (PHQ-9) -- Nutrition: normal -- cholesterol screening: per PCP -- osteoporosis screening: not due -- tobacco screening: not using -- alcohol screening: AUDIT questionnaire indicates low-risk usage. -- family history of breast cancer screening: done. not at high risk. -- no evidence of domestic violence or intimate partner violence. -- STD screening: gonorrhea/chlamydia NAAT not collected per patient request. -- pap smear collected per ASCCP guidelines -- HPV vaccination series: not eligilbe   History of ovarian cyst and fibroids with newer onset LLQ abdominal pain: repeat u/s for stabilty History of urethral stricture versus stenosis: referral to Urology   Prentice Docker, MD 01/08/2018 11:37 AM

## 2018-01-14 ENCOUNTER — Ambulatory Visit
Admission: RE | Admit: 2018-01-14 | Discharge: 2018-01-14 | Disposition: A | Discharge status: Home / Self Care | Payer: BLUE CROSS/BLUE SHIELD | Source: Ambulatory Visit | Attending: Obstetrics and Gynecology | Admitting: Obstetrics and Gynecology

## 2018-01-14 DIAGNOSIS — N83201 Unspecified ovarian cyst, right side: Secondary | ICD-10-CM | POA: Diagnosis present

## 2018-01-14 DIAGNOSIS — D219 Benign neoplasm of connective and other soft tissue, unspecified: Secondary | ICD-10-CM

## 2018-01-14 DIAGNOSIS — D259 Leiomyoma of uterus, unspecified: Secondary | ICD-10-CM | POA: Insufficient documentation

## 2018-01-15 LAB — IGP, APTIMA HPV, RFX 16/18,45
HPV Aptima: POSITIVE — AB
HPV GENOTYPE 18,45: NEGATIVE
HPV Genotype 16: NEGATIVE
PAP SMEAR COMMENT: 0

## 2018-02-03 ENCOUNTER — Encounter: Payer: Self-pay | Admitting: Urology

## 2018-02-03 ENCOUNTER — Ambulatory Visit: Payer: BLUE CROSS/BLUE SHIELD | Admitting: Urology

## 2018-02-03 VITALS — BP 164/74 | HR 79 | Ht 61.0 in | Wt 170.0 lb

## 2018-02-03 DIAGNOSIS — Z87448 Personal history of other diseases of urinary system: Secondary | ICD-10-CM

## 2018-02-03 DIAGNOSIS — R39198 Other difficulties with micturition: Secondary | ICD-10-CM

## 2018-02-03 LAB — URINALYSIS, COMPLETE
Bilirubin, UA: NEGATIVE
GLUCOSE, UA: NEGATIVE
NITRITE UA: NEGATIVE
Protein, UA: NEGATIVE
RBC, UA: NEGATIVE
Specific Gravity, UA: 1.02 (ref 1.005–1.030)
UUROB: 1 mg/dL (ref 0.2–1.0)
pH, UA: 7 (ref 5.0–7.5)

## 2018-02-03 LAB — MICROSCOPIC EXAMINATION: RBC MICROSCOPIC, UA: NONE SEEN /HPF (ref 0–?)

## 2018-02-03 LAB — BLADDER SCAN AMB NON-IMAGING: SCAN RESULT: 30

## 2018-02-03 NOTE — Progress Notes (Unsigned)
02/03/2018 3:39 PM   Amanda Hardin 1956-10-15 [id number]  Referring provider: Will Bonnet, MD [address] [place],  [address]  Chief Complaint  Patient presents with  . New Patient (Initial Visit)    Uretheral stricture    HPI: Is consulted to assess the patient's flow symptoms.  She can have urgency and then cannot go.  Sometimes she needs to go to work because in the morning she could not initiate urination.  Sometimes she pushes on her suprapubic area.  She does not necessarily feel empty.  She describes being dilated 3 or 4 times in [place] and thinks it helps.  She does not report ever taking any medication for bladder dysfunction other than some antibiotics at the time of dilation  She can void every 90 minutes to 2 hours and other times every 4 hours.  She gets up 3 times a night.  She is continent but does have some post void dribbling  She denies a history of kidney stones previous GU surgery and urinary tract infections.  She has no neurologic issues  Modifying factors: There are no other modifying factors  Associated signs and symptoms: There are no other associated signs and symptoms Aggravating and relieving factors: There are no other aggravating or relieving factors Severity: Moderate Duration: Persistent   PMH: Past Medical History:  Diagnosis Date  . Anxiety   . Hyperlipidemia   . Hypertension     Surgical History: Past Surgical History:  Procedure Laterality Date  . BREAST BIOPSY Left 09/2015   [place] core bx  NEG   . BREAST BIOPSY Right    both core and excisional done, multiples  . BREAST BIOPSY Left 2014  . CESAREAN SECTION    . TONSILLECTOMY      Home Medications:  Allergies as of 02/03/2018      Reactions   Norco [hydrocodone-acetaminophen] Nausea And Vomiting   Penicillin G    Other reaction(s): Unknown      Medication List        Accurate as of 02/03/18  3:39 PM. Always use your most recent med list.            chlorthalidone 25 MG tablet Commonly known as:  HYGROTON TK 1 T PO  ONCE A DAY   enalapril 10 MG tablet Commonly known as:  VASOTEC Take by mouth.   meclizine 25 MG tablet Commonly known as:  ANTIVERT TK 1 T PO BID TO TID PRF VERTIGO   meloxicam 15 MG tablet Commonly known as:  MOBIC Meloxicam 15MG  Oral Tablet QTY: 30 tablet Days: 30 Refills: 3  Written: 09/21/16 Patient Instructions: once a day   polyethylene glycol powder powder Commonly known as:  GLYCOLAX/MIRALAX USE UTD FOR COLONOSCOPY   pravastatin 40 MG tablet Commonly known as:  PRAVACHOL Take by mouth.   pregabalin 150 MG capsule Commonly known as:  LYRICA Take 150 mg by mouth 2 (two) times daily.   RESTASIS MULTIDOSE 0.05 % ophthalmic emulsion Generic drug:  cycloSPORINE PLACE ONE GTT INTO BOTH EYES BID   sertraline 100 MG tablet Commonly known as:  ZOLOFT   valACYclovir 500 MG tablet Commonly known as:  VALTREX Take by mouth.       Allergies:  Allergies  Allergen Reactions  . Norco [Hydrocodone-Acetaminophen] Nausea And Vomiting  . Penicillin G     Other reaction(s): Unknown    Family History: Family History  Problem Relation Age of Onset  . Diabetes Maternal Aunt   .  Hypertension Maternal Aunt   . Prostate cancer Father   . Breast cancer Neg Hx   . Bladder Cancer Neg Hx   . Kidney cancer Neg Hx     Social History:  reports that  has never smoked. she has never used smokeless tobacco. She reports that she does not drink alcohol or use drugs.  ROS: UROLOGY Frequent Urination?: No Hard to postpone urination?: No Burning/pain with urination?: No Get up at night to urinate?: Yes Leakage of urine?: Yes Urine stream starts and stops?: No Trouble starting stream?: Yes Do you have to strain to urinate?: Yes Blood in urine?: No Urinary tract infection?: Yes Sexually transmitted disease?: No Injury to kidneys or bladder?: No Painful intercourse?: No Weak stream?: No Currently  pregnant?: No Vaginal bleeding?: No Last menstrual period?: n  Gastrointestinal Nausea?: No Vomiting?: No Indigestion/heartburn?: Yes Diarrhea?: No Constipation?: Yes  Constitutional Fever: No Night sweats?: Yes Weight loss?: No Fatigue?: Yes  Skin Skin rash/lesions?: No Itching?: No  Eyes Blurred vision?: No Double vision?: No  Ears/Nose/Throat Sore throat?: No Sinus problems?: No  Hematologic/Lymphatic Swollen glands?: No Easy bruising?: No  Cardiovascular Leg swelling?: Yes Chest pain?: No  Respiratory Cough?: Yes Shortness of breath?: Yes  Endocrine Excessive thirst?: No  Musculoskeletal Back pain?: No Joint pain?: No  Neurological Headaches?: No Dizziness?: Yes  Psychologic Depression?: Yes Anxiety?: Yes  Physical Exam: BP (!) 164/74 (BP Location: Left Arm, Patient Position: Sitting, Cuff Size: Normal)   Pulse 79   Ht 5\' 1"  (1.549 m)   Wt 170 lb (77.1 kg)   BMI 32.12 kg/m   Constitutional:  Alert and oriented, No acute distress. HEENT: [place] AT, moist mucus membranes.  Trachea midline, no masses. Cardiovascular: No clubbing, cyanosis, or edema. Respiratory: Normal respiratory effort, no increased work of breathing. GI: Abdomen is soft, nontender, nondistended, no abdominal masses GU: No CVA tenderness.  No abdominal tenderness Skin: No rashes, bruises or suspicious lesions. Lymph: No cervical or inguinal adenopathy. Neurologic: Grossly intact, no focal deficits, moving all 4 extremities. Psychiatric: Normal mood and affect.  Laboratory Data: No results found for: WBC, HGB, HCT, MCV, PLT  No results found for: CREATININE  No results found for: PSA  No results found for: TESTOSTERONE  No results found for: HGBA1C  Urinalysis    Component Value Date/Time   COLORURINE YELLOW (A) 12/09/2017 0934   APPEARANCEUR HAZY (A) 12/09/2017 0934   LABSPEC 1.011 12/09/2017 0934   PHURINE 5.0 12/09/2017 [place] 12/09/2017  [place] 12/09/2017 [place] 12/09/2017 [place] 12/09/2017 0934   PROTEINUR NEGATIVE 12/09/2017 0934   NITRITE NEGATIVE 12/09/2017 0934   LEUKOCYTESUR TRACE (A) 12/09/2017 0934    Pertinent Imaging: None  Assessment & Plan: The patient has flow symptoms noted.  I did send the urine for culture and will call if it is positive.  My philosophy on dilations was discussed.  I recommended a pelvic examination cystoscopy.  It would be reasonable to do the dilation once and to measure the effect afterwards.  I do not think she is ever tried physical therapy or an alpha-blocker.  At this stage I will not be ordering urodynamics  1. History of urethral stricture 2.  Urinary frequency   No Follow-up on file.  Reece Packer, MD  [facility] [address], [place], [place] [address] [phone]

## 2018-02-06 LAB — CULTURE, URINE COMPREHENSIVE

## 2018-02-17 ENCOUNTER — Telehealth: Payer: Self-pay | Admitting: Obstetrics and Gynecology

## 2018-02-17 DIAGNOSIS — N83201 Unspecified ovarian cyst, right side: Secondary | ICD-10-CM

## 2018-02-17 DIAGNOSIS — D219 Benign neoplasm of connective and other soft tissue, unspecified: Secondary | ICD-10-CM

## 2018-02-17 NOTE — Telephone Encounter (Signed)
Left generic VM 

## 2018-02-17 NOTE — Telephone Encounter (Signed)
Patient is returning missed call from [facility] please advise

## 2018-02-18 NOTE — Telephone Encounter (Signed)
Pt is is following up on missed call from [facility]. Please advise

## 2018-02-19 NOTE — Telephone Encounter (Signed)
Referral submitted. [facility] will contact the patient directly to schedule an appointment.

## 2018-02-19 NOTE — Telephone Encounter (Signed)
Spoke with patient and discussed enlarging fibroid and right ovarian cyst. Also, discussed +HPV on pap smear with normal cytology. She states that she has never had an abnormal pap smear before. She is having a CT scan in a few days due to some abdominal pain on her right side.   I recommend that she have a hysterectomy and removal of her fallopian tubes and ovaries given the increases in size of her cyst and fibroid. Because of her growing fibroid and cyst, will also have gyn oncology backup or assistance. She can also seek a second opinion from them regarding need for this procedure, as she seems hesitant to proceed. She has been hesitant in the past about having a hysterectomy, as she was told by a doctor in [place] that she did not need a hysterectomy at that time (~2014).  Since things have changed I think an assessment of risk for cancer from gyn onc is important and we did discuss my concern that there is at least a small chance for malignancy. She voiced understanding and agreement with my plan.   Prentice Docker, MD, [facility], [facility] 02/19/2018 2:47 PM

## 2018-03-03 ENCOUNTER — Encounter: Payer: Self-pay | Admitting: Urology

## 2018-03-03 ENCOUNTER — Ambulatory Visit: Payer: BLUE CROSS/BLUE SHIELD | Admitting: Urology

## 2018-03-03 VITALS — BP 127/69 | HR 85 | Ht 60.0 in | Wt 164.0 lb

## 2018-03-03 DIAGNOSIS — Z87448 Personal history of other diseases of urinary system: Secondary | ICD-10-CM

## 2018-03-03 NOTE — Progress Notes (Signed)
03/03/2018 3:16 PM   Amanda Hardin [birth year] [id number]  Referring provider: Perrin Maltese, MD [place], [place] [address]  No chief complaint on file.   HPI: I was consulted to assess the patient's flow symptoms.  She can have urgency and then cannot go.  Sometimes she needs to go to work because in the morning she could not initiate urination.  Sometimes she pushes on her suprapubic area.  She does not necessarily feel empty.  She describes being dilated 3 or 4 times in [place] and thinks it helps.  She does not report ever taking any medication for bladder dysfunction other than some antibiotics at the time of dilation  She can void every 90 minutes to 2 hours and other times every 4 hours.  She gets up 3 times a night.  She is continent but does have some post void dribbling  The patient has flow symptoms noted.  I did send the urine for culture and will call if it is positive.  My philosophy on dilations was discussed.  I recommended a pelvic examination cystoscopy.  It would be reasonable to do the dilation once and to measure the effect afterwards.  I do not think she is ever tried physical therapy or an alpha-blocker.  At this stage I will not be ordering urodynamics  Today Urine culture negative Frequency stable.  Slow flow stable  After written and verbal consent patient underwent cystoscopy and dilation Cystoscopy: Bladder mucosa and trigone were normal.  There is no cystitis.  There is no stitch or foreign body or carcinoma.  One could argue there may have been very mild urethral meatus narrowing on pelvic examination.  Cystoscopically the urethra was normal  Carefully with well-lubricated sounds I dilated the patient from 16-28 [place].  She actually tolerated very well.  There was no bleeding or injury.   PMH: Past Medical History:  Diagnosis Date  . Anxiety   . Hyperlipidemia   . Hypertension     Surgical History: Past Surgical History:    Procedure Laterality Date  . BREAST BIOPSY Left 09/2015   [place] core bx  NEG   . BREAST BIOPSY Right    both core and excisional done, multiples  . BREAST BIOPSY Left 2014  . CESAREAN SECTION    . TONSILLECTOMY      Home Medications:  Allergies as of 03/03/2018      Reactions   Norco [hydrocodone-acetaminophen] Nausea And Vomiting   Penicillin G    Other reaction(s): Unknown      Medication List        Accurate as of 03/03/18  3:16 PM. Always use your most recent med list.          chlorthalidone 25 MG tablet Commonly known as:  HYGROTON TK 1 T PO  ONCE A DAY   enalapril 10 MG tablet Commonly known as:  VASOTEC Take by mouth.   meclizine 25 MG tablet Commonly known as:  ANTIVERT TK 1 T PO BID TO TID PRF VERTIGO   meloxicam 15 MG tablet Commonly known as:  MOBIC Meloxicam 15MG  Oral Tablet QTY: 30 tablet Days: 30 Refills: 3  Written: 09/21/16 Patient Instructions: once a day   polyethylene glycol powder powder Commonly known as:  GLYCOLAX/MIRALAX USE UTD FOR COLONOSCOPY   pravastatin 40 MG tablet Commonly known as:  PRAVACHOL Take by mouth.   pregabalin 150 MG capsule Commonly known as:  LYRICA Take 150 mg by mouth 2 (two) times  daily.   RESTASIS MULTIDOSE 0.05 % ophthalmic emulsion Generic drug:  cycloSPORINE PLACE ONE GTT INTO BOTH EYES BID   sertraline 100 MG tablet Commonly known as:  ZOLOFT   valACYclovir 500 MG tablet Commonly known as:  VALTREX Take by mouth.       Allergies:  Allergies  Allergen Reactions  . Norco [Hydrocodone-Acetaminophen] Nausea And Vomiting  . Penicillin G     Other reaction(s): Unknown    Family History: Family History  Problem Relation Age of Onset  . Diabetes Maternal Aunt   . Hypertension Maternal Aunt   . Prostate cancer Father   . Breast cancer Neg Hx   . Bladder Cancer Neg Hx   . Kidney cancer Neg Hx     Social History:  reports that  has never smoked. she has never used smokeless tobacco. She  reports that she does not drink alcohol or use drugs.  ROS:                                        Physical Exam: There were no vitals taken for this visit.    Laboratory Data:  Urinalysis    Component Value Date/Time   COLORURINE YELLOW (A) 12/09/2017 0934   APPEARANCEUR Cloudy (A) 02/03/2018 1545   LABSPEC 1.011 12/09/2017 0934   PHURINE 5.0 12/09/2017 0934   GLUCOSEU Negative 02/03/2018 1545   HGBUR NEGATIVE 12/09/2017 0934   BILIRUBINUR Negative 02/03/2018 1545   KETONESUR NEGATIVE 12/09/2017 0934   PROTEINUR Negative 02/03/2018 1545   PROTEINUR NEGATIVE 12/09/2017 0934   NITRITE Negative 02/03/2018 1545   NITRITE NEGATIVE 12/09/2017 0934   LEUKOCYTESUR 1+ (A) 02/03/2018 1545    Pertinent Imaging:   Assessment & Plan: The patient was dilated and will be clinically reassessed in the next few weeks to see if this helps her symptoms.  Other therapies noted above may be offered.   There are no diagnoses linked to this encounter.  No Follow-up on file.  Reece Packer, MD  [facility] [address] [place], [place] [address] [phone]

## 2018-03-19 ENCOUNTER — Inpatient Hospital Stay: Payer: BLUE CROSS/BLUE SHIELD | Attending: Obstetrics and Gynecology | Admitting: Obstetrics and Gynecology

## 2018-03-19 ENCOUNTER — Inpatient Hospital Stay: Payer: BLUE CROSS/BLUE SHIELD

## 2018-03-19 ENCOUNTER — Encounter: Payer: Self-pay | Admitting: Obstetrics and Gynecology

## 2018-03-19 VITALS — BP 123/75 | HR 66 | Temp 97.7°F | Resp 18 | Ht 60.0 in | Wt 164.2 lb

## 2018-03-19 DIAGNOSIS — N83202 Unspecified ovarian cyst, left side: Secondary | ICD-10-CM | POA: Diagnosis not present

## 2018-03-19 DIAGNOSIS — D251 Intramural leiomyoma of uterus: Secondary | ICD-10-CM

## 2018-03-19 DIAGNOSIS — Z87891 Personal history of nicotine dependence: Secondary | ICD-10-CM | POA: Insufficient documentation

## 2018-03-19 DIAGNOSIS — Z780 Asymptomatic menopausal state: Secondary | ICD-10-CM | POA: Insufficient documentation

## 2018-03-19 DIAGNOSIS — Z6832 Body mass index (BMI) 32.0-32.9, adult: Secondary | ICD-10-CM | POA: Diagnosis not present

## 2018-03-19 DIAGNOSIS — N83201 Unspecified ovarian cyst, right side: Secondary | ICD-10-CM | POA: Insufficient documentation

## 2018-03-19 DIAGNOSIS — R1032 Left lower quadrant pain: Secondary | ICD-10-CM | POA: Diagnosis not present

## 2018-03-19 LAB — LACTATE DEHYDROGENASE: LDH: 130 U/L (ref 98–192)

## 2018-03-19 NOTE — Progress Notes (Signed)
Gynecologic Oncology Consult Visit   Referring Provider: Prentice Docker, MD  Chief Concern: Increasing leiomyoma and bilateral ovarian cysts  Subjective:  Amanda Hardin is a 62 y.o. female who is seen in consultation from Dr. Glennon Mac for enlarging leiomyoma in postmenopausal seeing, bilateral ovarian cysts, and symptomatic LLQ pain.   History of Present Illness: Patient is a 62 y.o. female who presents today for ultrasound evaluation of her ovarian cyst and uterine fibroids. Based on her last exam 01/08/2018 her uterus was enlarged to the level of the umbilicus.    Historical (from records reviewed): 07/17/13: Ultrasound report             largest fibroid 8 x 5.6 x 7.1 cm             Simple cyst noted (laterality not specified): 7.3 cm 11/10/13: ultrasound report             Fibroids: 4.6 x 5.4 cm, 1.8 x 2.0 xcm, 5.4 x 6.8 cm             No cysts mentioned 11/26/2016: CT report             Fibroid: 7.7 x 6.7 x 7 cm             Cyst (possibly from right ovary): 7.5 x 7.5 x 7.7 cm 01/25/2017: Ultrasound  Uterus: 12.0 x 8.3 x 8.6 cm; several fibroids, largest is 8.0 x 7.1 x 6.3 cm.  Several other 2-3cm fibroids mentioned in report. Adnexa: cyst seen             Right ovary: 8.4 x 8.7 x 8.4 cm simple appearing cyst             Left ovary: 4.3 x 2.9 x 3.8 cm simple appearing cyst 05/27/17: Ultrasound  Uterus: 12.5 x 7.9 x 9.2 cm. Multiple uterine fibroids, including a dominant 7.4 x 8.2 x 5.7 cm intramural fibroid along the right posterior uterine body, previously 8.0 cm, grossly unchanged. Additional smaller uterine fibroids measuring up to 4.7 cm in the left anterior uterine body. Adnexa: cyst seen             Right ovary: ?7.7 x 7.6 x 7.5 cm simple appearing cyst - decreased             Left ovary: 4.4 x 4.2 x 3.9 cm  simple appearing cyst - stable No abnormal free fluid. 01/14/17: Ultrasound  Uterus: 11.9 x 5.4 x 10.1 cm. Two fibroids including a dominant 10.5 cm right fundal  fibroid. Anterior left fundal 2.4 cm fibroid. The dominant fibroid has enlarged a slightly since prior study when this measured up to 8.2 cm. Adnexa: cyst seen             Right ovary: 9.0 x 8.1 x 9.3 cm simple appearing cyst - increased             Left ovary: 4.0 x 3.6 x 3.2 cm  simple appearing cyst - stable No abnormal free fluid.  01/08/18-  Pap- Negative for intraepithelial lesion or malignancy HPV Aptima- positive. Negative for HPV genotype 16, 18, and 45.    Dr. Glennon Mac has recommended surgical evaluation given increased leiomyoma size.   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  Dr. Glennon Mac referred to Urology.    Problem List: Patient Active Problem List   Diagnosis Date Noted  .  Intramural leiomyoma of uterus 03/19/2018  . Leiomyoma 02/20/2017  . Bilateral ovarian cysts 02/20/2017    Past Medical History: Past Medical History:  Diagnosis Date  . Anxiety   . Arthritis   . Cough   . Depression   . Hyperlipidemia   . Hypertension   . Thyroid nodule     Past Surgical History: Past Surgical History:  Procedure Laterality Date  . BREAST BIOPSY Left 09/2015   [place] core bx  NEG   . BREAST BIOPSY Right    both core and excisional done, multiples  . BREAST BIOPSY Left 2014  . CESAREAN SECTION    . TONSILLECTOMY      Past Gynecologic History:  Post-menopausal, G1P1. Last pap 12/2017. Not currently sexually active.    OB History:  OB History  Gravida Para Term Preterm AB Living  1 1 0 1 0 1  SAB TAB Ectopic Multiple Live Births  0 0 0 0 1    # Outcome Date GA Lbr Len/2nd Weight Sex Delivery Anes PTL Lv  1 Preterm  [redacted]w[redacted]d  7 lb (3.175 kg) M CS-LTranv  Y LIV    Family History: Family History  Problem Relation Age of Onset  . Diabetes Maternal Aunt   . Hypertension Maternal Aunt   . Prostate cancer Father   . Heart failure Father   . Pancreatic cancer Paternal  Aunt   . Breast cancer Neg Hx   . Bladder Cancer Neg Hx   . Kidney cancer Neg Hx     Social History: Social History   Socioeconomic History  . Marital status: Single    Spouse name: Not on file  . Number of children: Not on file  . Years of education: Not on file  . Highest education level: Not on file  Occupational History  . Not on file  Social Needs  . Financial resource strain: Not on file  . Food insecurity:    Worry: Not on file    Inability: Not on file  . Transportation needs:    Medical: Not on file    Non-medical: Not on file  Tobacco Use  . Smoking status: Former Smoker    Years: 25.00    Types: Cigarettes    Last attempt to quit: 03/19/2002    Years since quitting: 16.0  . Smokeless tobacco: Never Used  Substance and Sexual Activity  . Alcohol use: No  . Drug use: No  . Sexual activity: Not Currently    Birth control/protection: Post-menopausal  Lifestyle  . Physical activity:    Days per week: Not on file    Minutes per session: Not on file  . Stress: Not on file  Relationships  . Social connections:    Talks on phone: Not on file    Gets together: Not on file    Attends religious service: Not on file    Active member of club or organization: Not on file    Attends meetings of clubs or organizations: Not on file    Relationship status: Not on file  . Intimate partner violence:    Fear of current or ex partner: Not on file    Emotionally abused: Not on file    Physically abused: Not on file    Forced sexual activity: Not on file  Other Topics Concern  . Not on file  Social History Narrative  . Not on file    Allergies: Allergies  Allergen Reactions  . Norco [Hydrocodone-Acetaminophen] Nausea And Vomiting  .  Penicillin G     Other reaction(s): Unknown    Current Medications: Current Outpatient Medications  Medication Sig Dispense Refill  . chlorthalidone (HYGROTON) 25 MG tablet TK 1 T PO  ONCE A DAY  3  . enalapril (VASOTEC) 10 MG  tablet Take by mouth.    . meloxicam (MOBIC) 15 MG tablet Meloxicam 15MG  Oral Tablet QTY: 30 tablet Days: 30 Refills: 3  Written: 09/21/16 Patient Instructions: once a day    . pravastatin (PRAVACHOL) 40 MG tablet Take by mouth.    . pregabalin (LYRICA) 150 MG capsule Take 150 mg by mouth 2 (two) times daily.    . RESTASIS MULTIDOSE 0.05 % ophthalmic emulsion PLACE ONE GTT INTO BOTH EYES BID  0  . sertraline (ZOLOFT) 100 MG tablet   3  . valACYclovir (VALTREX) 500 MG tablet Take by mouth.    . meclizine (ANTIVERT) 25 MG tablet TK 1 T PO BID TO TID PRF VERTIGO    . polyethylene glycol powder (GLYCOLAX/MIRALAX) powder USE UTD FOR COLONOSCOPY  0   No current facility-administered medications for this visit.     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  Musculoskeletal: no complaints  Extremities: no complaints  Skin: no complaints  Neuro: no complaints  Endocrine: no complaints  Psych: no complaints     Objective:  Physical Examination:  BP 123/75 (BP Location: Left Arm, Patient Position: Sitting)   Pulse 66   Temp 97.7 F (36.5 C) (Tympanic)   Resp 18   Ht 5' (1.524 m)   Wt 164 lb 3.2 oz (74.5 kg)   BMI 32.07 kg/m    ECOG Performance Status: 0 - Asymptomatic  General appearance: alert, cooperative and appears stated age HEENT:sclera clear, anicteric, oropharynx clear, no lesions and neck supple with midline trachea Lymph node survey: non-palpable, axillary, inguinal, supraclavicular Cardiovascular: regular rate and rhythm, no murmurs or gallops Respiratory: normal air entry, lungs clear to auscultation Breast exam: breasts appear normal, no suspicious masses, no skin or nipple changes or axillary nodes, not examined. Abdomen: soft, non-tender, without obvious masses or organomegaly, no  hernias and well healed incision.  With deep palpation can barely palpate the uterine fibroid in the mid abdominal quadrant - did not appreciate mass extending to the umbilicus. Back: inspection of back is normal Extremities: extremities normal, atraumatic, no cyanosis or edema Skin exam - normal coloration and turgor, no rashes, no suspicious skin lesions noted. Neurological exam reveals alert, oriented, normal speech, no focal findings or movement disorder noted.  Pelvic: exam chaperoned by nurse;  Vulva: normal appearing vulva with no masses, tenderness or lesions; Vagina: normal vagina; Adnexa: no masses appreciated, vaginal exam limited by narrow caliber; Uterus: enlarged but can not determine size or shape, definitely nontender seems mobile, and no involvement of the parametria or sidewall based on RV exam ; Cervix: no lesions; Rectal: confimatory and normal rectal  Lab Review Labs on site today: CA 125 & LDH (pending)  Radiologic Imaging: [place] reviewed personally with patient    Assessment:  Amanda Hardin is a 62 y.o. 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 Bilateral adnexal simple cysts, most likely benign.    Medical co-morbidities complicating care: HTN and prior abdominal surgery. Body mass index is 32.07 kg/m.   Plan:  Problem List Items Addressed This Visit      Genitourinary   Bilateral ovarian cysts - Primary   Intramural leiomyoma of uterus   Relevant Orders   MR PELVIS W WO CONTRAST   Lactate dehydrogenase   CA 125      We discussed options for management including definitive surgical evaluation versus further testing is she wants to avoid surgery. Recommended MRI, CA125, and LDH, but these tests are not conclusive and even if normal do not rule out leiomyosarcoma. We also discussed endometrial biopsy which can be positive in ~30-60% cases of leiomyosarcoma.   After  results obtained we will discuss further with the patient regarding management options. She really wants to avoid surgery. If needed she would prefer a minimally invasive approach rather than laparotomy. We discussed that her conversion rate from MIS to laparotomy is 50%. We would not proceed with MIS approach if MRI concerning for sarcoma. Moreover the uterus would need to be placed into a Endocatch bag and contained manual morcellation would be performed to avoid spillage of potential tumor. We discussed how this differs from power morcellation which we do not recommend given concern for inadvertent dissemination of occult sarcoma. We have published our series of using this approach in women with leiomyoma demonstrating feasibility and safety. If she opts for surgery we will further discuss the risks of surgery in greater detail.   The patient's diagnosis, an outline of the further diagnostic and laboratory studies which will be required, the recommendation, and alternatives were discussed.  All questions were answered to the patient's satisfaction.  A total of 80 minutes were spent with the patient/family today; 20% was spent in education, counseling and coordination of care.   I personally reviewed the patient's history, completed key elements of her exam (lymph node survey, abdominal/pelvic exam), and was involved in decision making in conjunction with Ms. Beckey Rutter.     Gillis Ends, MD     CC:  Prentice Docker, MD

## 2018-03-19 NOTE — Progress Notes (Signed)
Patient doesn't report any bleeding, no discharge, no pain or discomfort, occasional lower right side discomfort. Patient also report weight gain, night sweats, palpation, SOB, Constipation, incomplete emptying of the bladder.

## 2018-03-20 LAB — CA 125: Cancer Antigen (CA) 125: 18.5 U/mL (ref 0.0–38.1)

## 2018-03-21 ENCOUNTER — Telehealth: Payer: Self-pay

## 2018-03-21 NOTE — Telephone Encounter (Signed)
Called and notified of lab results.  Ref Range & Units2d ago [facility] - 192 U/L130   Ref Range & Units2d ago Cancer Antigen (CA) 1250.0 - 38.1 U/mL18.5     Oncology Nurse Navigator Documentation  Navigator Location: [facility] (03/21/18 1400)   )Navigator Encounter Type: Telephone (03/21/18 1400) Telephone: Diagnostic Results (03/21/18 1400)                                                  Time Spent with Patient: 15 (03/21/18 1400)

## 2018-03-24 ENCOUNTER — Encounter: Payer: Self-pay | Admitting: Urology

## 2018-03-24 ENCOUNTER — Ambulatory Visit: Payer: BLUE CROSS/BLUE SHIELD | Admitting: Urology

## 2018-03-24 VITALS — BP 148/65 | HR 62 | Ht 60.0 in | Wt 164.3 lb

## 2018-03-24 DIAGNOSIS — R350 Frequency of micturition: Secondary | ICD-10-CM | POA: Diagnosis not present

## 2018-03-24 NOTE — Progress Notes (Signed)
03/24/2018 11:41 AM   Amanda Hardin [birth year] [id number]  Referring provider: Perrin Maltese, MD [address] [place], [place] [address]  Chief Complaint  Patient presents with  . Follow-up    HPI: I was consulted to assess the patient's flow symptoms. She can have urgency and then cannot go. Sometimes she needs to go to work because in the morning she could not initiate urination. Sometimes she pushes on her suprapubic area. She does not necessarily feel empty. She describes being dilated 3 or 4 times in [place] and thinks it helps. She does not report ever taking any medication for bladder dysfunction other than some antibiotics at the time of dilation  She can void every 90 minutes to 2 hours and other times every 4 hours. She gets up 3 times a night. She is continent but does have some post void dribbling  The patient has flow symptoms noted. I did send the urine for culture and will call if it is positive. My philosophy on dilations was discussed. I recommended a pelvic examination cystoscopy. It would be reasonable to do the dilation once and to measure the effect afterwards. I do not think she is ever tried physical therapy or an alpha-blocker. At this stage I will not be ordering urodynamics  Urine culture negative  Cysto: normal   Carefully with well-lubricated sounds I dilated the patient from 16-28 [place].  She actually tolerated very well.  There was no bleeding or injury.  Today The patient thinks her flow was a bit better.  She still has to hesitate for up to a minute at times.  She did not want to try another medication such as an alpha-blocker today.  She is on many medications and might be planning a hysterectomy which is new for her.  Apparently she might have fibroids.  I did not offer physical therapy today       PMH: Past Medical History:  Diagnosis Date  . Anxiety   . Arthritis   . Cough   . Depression   . Hyperlipidemia   .  Hypertension   . Thyroid nodule     Surgical History: Past Surgical History:  Procedure Laterality Date  . BREAST BIOPSY Left 09/2015   [place] core bx  NEG   . BREAST BIOPSY Right    both core and excisional done, multiples  . BREAST BIOPSY Left 2014  . CESAREAN SECTION    . TONSILLECTOMY      Home Medications:  Allergies as of 03/24/2018      Reactions   Norco [hydrocodone-acetaminophen] Nausea And Vomiting   Penicillin G    Other reaction(s): Unknown      Medication List        Accurate as of 03/24/18 11:41 AM. Always use your most recent med list.          chlorthalidone 25 MG tablet Commonly known as:  HYGROTON TK 1 T PO  ONCE A DAY   enalapril 10 MG tablet Commonly known as:  VASOTEC Take by mouth.   meclizine 25 MG tablet Commonly known as:  ANTIVERT TK 1 T PO BID TO TID PRF VERTIGO   meloxicam 15 MG tablet Commonly known as:  MOBIC Meloxicam 15MG  Oral Tablet QTY: 30 tablet Days: 30 Refills: 3  Written: 09/21/16 Patient Instructions: once a day   pantoprazole 40 MG tablet Commonly known as:  PROTONIX Take 40 mg by mouth daily.   pravastatin 40 MG tablet Commonly known as:  PRAVACHOL Take by mouth.   pregabalin 150 MG capsule Commonly known as:  LYRICA Take 150 mg by mouth 2 (two) times daily.   sertraline 100 MG tablet Commonly known as:  ZOLOFT   valACYclovir 500 MG tablet Commonly known as:  VALTREX Take by mouth.       Allergies:  Allergies  Allergen Reactions  . Norco [Hydrocodone-Acetaminophen] Nausea And Vomiting  . Penicillin G     Other reaction(s): Unknown    Family History: Family History  Problem Relation Age of Onset  . Diabetes Maternal Aunt   . Hypertension Maternal Aunt   . Prostate cancer Father   . Heart failure Father   . Pancreatic cancer Paternal Aunt   . Breast cancer Neg Hx   . Bladder Cancer Neg Hx   . Kidney cancer Neg Hx     Social History:  reports that she quit smoking about 16 years ago. Her smoking  use included cigarettes. She quit after 25.00 years of use. She has never used smokeless tobacco. She reports that she does not drink alcohol or use drugs.  ROS:                                        Physical Exam: BP (!) 148/65 (BP Location: Right Arm, Patient Position: Sitting, Cuff Size: Normal)   Pulse 62   Ht 5' (1.524 m)   Wt 164 lb 4.8 oz (74.5 kg)   BMI 32.09 kg/m   Constitutional:  Alert and oriented, No acute distress.  Laboratory Data: No results found for: WBC, HGB, HCT, MCV, PLT  No results found for: CREATININE  No results found for: PSA  No results found for: TESTOSTERONE  No results found for: HGBA1C  Urinalysis    Component Value Date/Time   COLORURINE YELLOW (A) 12/09/2017 0934   APPEARANCEUR Cloudy (A) 02/03/2018 1545   LABSPEC 1.011 12/09/2017 0934   PHURINE 5.0 12/09/2017 0934   GLUCOSEU Negative 02/03/2018 1545   HGBUR NEGATIVE 12/09/2017 0934   BILIRUBINUR Negative 02/03/2018 [place] 12/09/2017 0934   PROTEINUR Negative 02/03/2018 1545   PROTEINUR NEGATIVE 12/09/2017 0934   NITRITE Negative 02/03/2018 1545   NITRITE NEGATIVE 12/09/2017 0934   LEUKOCYTESUR 1+ (A) 02/03/2018 1545    Pertinent Imaging:   Assessment & Plan: The patient would like to regroup in 6 months and we will set up treatment goals.  Overall she is improved.  Her response to the dilation was mild to moderate at best  There are no diagnoses linked to this encounter.  No follow-ups on file.  Reece Packer, MD  [facility] [address] [place], [place] [address] [phone]

## 2018-03-27 ENCOUNTER — Ambulatory Visit: Payer: BLUE CROSS/BLUE SHIELD

## 2018-03-28 ENCOUNTER — Ambulatory Visit: Payer: BLUE CROSS/BLUE SHIELD

## 2018-03-31 ENCOUNTER — Telehealth: Payer: Self-pay | Admitting: Nurse Practitioner

## 2018-03-31 NOTE — Telephone Encounter (Signed)
Received call that patient did not show for MRI this morning. Called to follow-up. No answer. Left message to have patient return call.

## 2018-04-04 ENCOUNTER — Ambulatory Visit: Admission: RE | Admit: 2018-04-04 | Payer: BLUE CROSS/BLUE SHIELD | Source: Ambulatory Visit

## 2018-04-17 ENCOUNTER — Ambulatory Visit
Admission: RE | Admit: 2018-04-17 | Discharge: 2018-04-17 | Disposition: A | Discharge status: Home / Self Care | Payer: BLUE CROSS/BLUE SHIELD | Source: Ambulatory Visit | Attending: Nurse Practitioner | Admitting: Nurse Practitioner

## 2018-04-17 DIAGNOSIS — N83202 Unspecified ovarian cyst, left side: Secondary | ICD-10-CM | POA: Diagnosis not present

## 2018-04-17 DIAGNOSIS — N83201 Unspecified ovarian cyst, right side: Secondary | ICD-10-CM | POA: Insufficient documentation

## 2018-04-17 DIAGNOSIS — D251 Intramural leiomyoma of uterus: Secondary | ICD-10-CM | POA: Diagnosis present

## 2018-04-17 DIAGNOSIS — D259 Leiomyoma of uterus, unspecified: Secondary | ICD-10-CM | POA: Insufficient documentation

## 2018-04-17 LAB — POCT I-STAT CREATININE: CREATININE: 1.2 mg/dL — AB (ref 0.44–1.00)

## 2018-04-17 MED ORDER — GADOBENATE DIMEGLUMINE 529 MG/ML IV SOLN
15.0000 mL | Freq: Once | INTRAVENOUS | Status: AC | PRN
  Administered 2018-04-17: 15 mL via INTRAVENOUS

## 2018-04-25 ENCOUNTER — Telehealth: Payer: Self-pay | Admitting: Nurse Practitioner

## 2018-04-25 NOTE — Telephone Encounter (Signed)
Attempted to call patient to discuss results of recent MRI. Voicemail is full and unable to leave message. Have spoken to Dr. Theora Gianotti who recommends having patient return to clinic on 5/29 or 6/5 to further discuss results and management. Message sent to scheduling to have appointment made.

## 2018-04-30 ENCOUNTER — Telehealth: Payer: Self-pay | Admitting: Nurse Practitioner

## 2018-04-30 NOTE — Telephone Encounter (Signed)
Spoke to patient. Shared results from recent MRI. All questions answered. Confirmed upcoming appointment for follow-up with Dr. Theora Gianotti.

## 2018-05-09 ENCOUNTER — Telehealth: Payer: Self-pay | Admitting: Nurse Practitioner

## 2018-05-09 NOTE — Telephone Encounter (Signed)
Had message to confirm f/u with patient. Scheduled for 6/5 to discuss results of MRI and treatment planning. Patient thanked for call and is aware of appt.

## 2018-05-21 ENCOUNTER — Inpatient Hospital Stay: Payer: BLUE CROSS/BLUE SHIELD | Attending: Obstetrics and Gynecology | Admitting: Obstetrics and Gynecology

## 2018-05-21 VITALS — BP 130/80 | HR 62 | Temp 96.9°F | Resp 20 | Wt 161.0 lb

## 2018-05-21 DIAGNOSIS — D259 Leiomyoma of uterus, unspecified: Secondary | ICD-10-CM | POA: Insufficient documentation

## 2018-05-21 DIAGNOSIS — D251 Intramural leiomyoma of uterus: Secondary | ICD-10-CM

## 2018-05-21 DIAGNOSIS — R1032 Left lower quadrant pain: Secondary | ICD-10-CM | POA: Diagnosis not present

## 2018-05-21 DIAGNOSIS — N83201 Unspecified ovarian cyst, right side: Secondary | ICD-10-CM

## 2018-05-21 DIAGNOSIS — N83202 Unspecified ovarian cyst, left side: Secondary | ICD-10-CM | POA: Diagnosis not present

## 2018-05-21 DIAGNOSIS — Z87891 Personal history of nicotine dependence: Secondary | ICD-10-CM | POA: Diagnosis not present

## 2018-05-21 NOTE — Progress Notes (Signed)
Patient here today for follow up regarding ovarian cysts. Patient denies concerns today.

## 2018-05-21 NOTE — Progress Notes (Addendum)
Gynecologic Oncology Interval Visit   Referring Provider: Prentice Docker, MD  Chief Concern: Increasing leiomyoma and bilateral ovarian cysts  Subjective:  Amanda Hardin is a 62 y.o. female who is seen in consultation from Dr. Glennon Mac for enlarging leiomyoma in postmenopausal seeing, bilateral ovarian cysts, and symptomatic LLQ pain.   She was seen in clinic on 03/19/18 for initial evaluation. She wishes to avoid surgery if possible. We did CA 125, LDH, and MRI.   CA 125- 18.5  LDH- 130  04/17/2018- MR PELVIS W WO CONTRAST 1. Dominant markedly heterogeneous fat-containing 10.0 cm transmural right uterine body mass, which has increased in size since 2018 pelvic sonogram. 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 Surgical consultation advised. 2. Large simple 8.2 cm right ovarian cyst. Mildly complex septated 4.8 cm left ovarian cyst. 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 Continued sonographic surveillance advised annually if not resected. 3. Numerous smaller typical uterine fibroids.  Today, she returns to clinic for discussion of imaging and further discussion of management options.    History of Present Illness: Patient is a 63 y.o. female who presents today for ultrasound evaluation of her ovarian cyst and uterine fibroids. Based on her last exam 01/08/2018 her uterus was enlarged to the level of the umbilicus.   Historical (from records reviewed): 07/17/13: Ultrasound report             largest fibroid 8 x 5.6 x 7.1 cm             Simple cyst noted (laterality not specified): 7.3 cm 11/10/13: ultrasound report             Fibroids: 4.6 x 5.4 cm, 1.8 x 2.0 xcm, 5.4 x 6.8 cm             No cysts mentioned 11/26/2016: CT report             Fibroid: 7.7 x 6.7 x 7 cm             Cyst (possibly from right ovary):  7.5 x 7.5 x 7.7 cm 01/25/2017: Ultrasound  Uterus: 12.0 x 8.3 x 8.6 cm; several fibroids, largest is 8.0 x 7.1 x 6.3 cm.  Several other 2-3cm fibroids mentioned in report. Adnexa: cyst seen             Right ovary: 8.4 x 8.7 x 8.4 cm simple appearing cyst             Left ovary: 4.3 x 2.9 x 3.8 cm simple appearing cyst 05/27/17: Ultrasound  Uterus: 12.5 x 7.9 x 9.2 cm. Multiple uterine fibroids, including a dominant 7.4 x 8.2 x 5.7 cm intramural fibroid along the right posterior uterine body, previously 8.0 cm, grossly unchanged. Additional smaller uterine fibroids measuring up to 4.7 cm in the left anterior uterine body. Adnexa: cyst seen             Right ovary: ?7.7 x 7.6 x 7.5 cm simple appearing cyst - decreased             Left ovary: 4.4 x 4.2 x 3.9 cm  simple appearing cyst - stable No abnormal free fluid. 01/14/17: Ultrasound  Uterus: 11.9 x 5.4 x 10.1 cm. Two fibroids including a dominant 10.5 cm right fundal fibroid. Anterior left fundal 2.4 cm fibroid. The dominant fibroid has enlarged a slightly since prior study when this measured up to  8.2 cm. Adnexa: cyst seen             Right ovary: 9.0 x 8.1 x 9.3 cm simple appearing cyst - increased             Left ovary: 4.0 x 3.6 x 3.2 cm  simple appearing cyst - stable No abnormal free fluid.  01/08/18-  Pap- Negative for intraepithelial lesion or malignancy HPV Aptima- positive. Negative for HPV genotype 16, 18, and 45.   Dr. Glennon Mac has recommended surgical evaluation given increased leiomyoma size.   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  Dr. Glennon Mac referred to Urology.    Problem List: Patient Active Problem List   Diagnosis Date Noted  . Intramural leiomyoma of uterus 03/19/2018  . Leiomyoma 02/20/2017  . Bilateral ovarian cysts 02/20/2017    Past Medical History: Past Medical History:  Diagnosis Date  . Anxiety    . Arthritis   . Cough   . Depression   . Hyperlipidemia   . Hypertension   . Thyroid nodule     Past Surgical History: Past Surgical History:  Procedure Laterality Date  . BREAST BIOPSY Left 09/2015   [place] core bx  NEG   . BREAST BIOPSY Right    both core and excisional done, multiples  . BREAST BIOPSY Left 2014  . CESAREAN SECTION    . TONSILLECTOMY      Past Gynecologic History:  Post-menopausal, G1P1. Last pap 12/2017. Not currently sexually active.    OB History:  OB History  Gravida Para Term Preterm AB Living  1 1 0 1 0 1  SAB TAB Ectopic Multiple Live Births  0 0 0 0 1    # Outcome Date GA Lbr Len/2nd Weight Sex Delivery Anes PTL Lv  1 Preterm  [redacted]w[redacted]d  7 lb (3.175 kg) M CS-LTranv  Y LIV    Family History: Family History  Problem Relation Age of Onset  . Diabetes Maternal Aunt   . Hypertension Maternal Aunt   . Prostate cancer Father   . Heart failure Father   . Pancreatic cancer Paternal Aunt   . Breast cancer Neg Hx   . Bladder Cancer Neg Hx   . Kidney cancer Neg Hx     Social History: Social History   Socioeconomic History  . Marital status: Single    Spouse name: Not on file  . Number of children: Not on file  . Years of education: Not on file  . Highest education level: Not on file  Occupational History  . Not on file  Social Needs  . Financial resource strain: Not on file  . Food insecurity:    Worry: Not on file    Inability: Not on file  . Transportation needs:    Medical: Not on file    Non-medical: Not on file  Tobacco Use  . Smoking status: Former Smoker    Years: 25.00    Types: Cigarettes    Last attempt to quit: 03/19/2002    Years since quitting: 16.1  . Smokeless tobacco: Never Used  Substance and Sexual Activity  . Alcohol use: No  . Drug use: No  . Sexual activity: Not Currently    Birth control/protection: Post-menopausal  Lifestyle  . Physical activity:    Days per week: Not on file    Minutes per session: Not on  file  .  Stress: Not on file  Relationships  . Social connections:    Talks on phone: Not on file    Gets together: Not on file    Attends religious service: Not on file    Active member of club or organization: Not on file    Attends meetings of clubs or organizations: Not on file    Relationship status: Not on file  . Intimate partner violence:    Fear of current or ex partner: Not on file    Emotionally abused: Not on file    Physically abused: Not on file    Forced sexual activity: Not on file  Other Topics Concern  . Not on file  Social History Narrative  . Not on file    Allergies: Allergies  Allergen Reactions  . Norco [Hydrocodone-Acetaminophen] Nausea And Vomiting  . Penicillin G     Other reaction(s): Unknown    Current Medications: Current Outpatient Medications  Medication Sig Dispense Refill  . chlorthalidone (HYGROTON) 25 MG tablet TK 1 T PO  ONCE A DAY  3  . enalapril (VASOTEC) 10 MG tablet Take by mouth.    . meclizine (ANTIVERT) 25 MG tablet TK 1 T PO BID TO TID PRF VERTIGO    . meloxicam (MOBIC) 15 MG tablet Meloxicam 15MG  Oral Tablet QTY: 30 tablet Days: 30 Refills: 3  Written: 09/21/16 Patient Instructions: once a day    . pantoprazole (PROTONIX) 40 MG tablet Take 40 mg by mouth daily.    . pravastatin (PRAVACHOL) 40 MG tablet Take by mouth.    . pregabalin (LYRICA) 150 MG capsule Take 150 mg by mouth 2 (two) times daily.    . sertraline (ZOLOFT) 100 MG tablet   3  . valACYclovir (VALTREX) 500 MG tablet Take by mouth.     No current facility-administered medications for this visit.     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 recent urethral dilation  Musculoskeletal: back pain  Extremities: leg swelling  Skin: no complaints  Neuro: no complaints  Endocrine: no complaints  Psych: depression      Objective:  Physical Examination:  BP 130/80   Pulse 62   Temp (!) 96.9 F (36.1 C) (Tympanic)   Resp 20   Wt 161 lb (73 kg)   BMI 31.44 kg/m    ECOG Performance Status: 0 - Asymptomatic  General appearance: alert, cooperative and appears stated age HEENT:sclera clear, anicteric, oropharynx clear, no lesions and neck supple with midline trachea Abdomen: soft, non-tender, without obvious masses or organomegaly, no hernias and well healed vertical incision.  With deep palpation can barely palpate the uterine fibroid in the mid abdominal quadrant - did not appreciate mass extending to the umbilicus.  Extremities: extremities normal, atraumatic, no cyanosis or edema Neurological exam reveals alert, oriented, normal speech, no focal findings or movement disorder noted.  Pelvic: exam chaperoned by nurse;  Vulva: normal appearing vulva with no masses, tenderness or lesions; Vagina: normal vagina; narrow caliber. Adnexa: no masses appreciated, vaginal exam limited by narrow caliber; Uterus: enlarged can palpate at ~ 16 weeks, definitely nontender seems mobile, mobility is limited, and no involvement of the parametria based on RV exam but does extend to bilateral  sidewalls; Cervix: no lesions; Rectal: confimatory and normal rectal  Lab Review As per HPI   Radiologic Imaging: MRI personally reviewed and results  reviewed with patient    Assessment:  Amanda Hardin is a 62 y.o. 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 Bilateral simple right cyst and mildly complex left cyst and  most likely benign.    Medical co-morbidities complicating care: HTN and prior abdominal surgery. Body mass index is 31.44 kg/m.   Plan:   Problem List Items Addressed This Visit      Genitourinary   Bilateral ovarian cysts   Intramural leiomyoma of uterus - Primary     We discussed management and I have recommended  surgery for definitive management. She strongly desires to have MIS approach. Plan for robotic surgery with TLHBSO and contained manual morcellation within a bag. I explained that vaginal removal may not be possible due to narrow vaginal caliber. We may need to convert to small abdominal incision for removal. There is also a possibility and need to convert to laparotomy if uterine size limits ability to perform via MIS. We discussed concerns of power morcellation for hysterectomy for leiomyomas. I explained that I do not perform those procedures and that is due to concern with malignant spread if she has LMS. We perform only manual morcellation contained within a laparoscopic bag.   She will see Dr. Glennon Mac for her preop visit and consent. If she does not have malignancy she can follow up with Dr. Marisue Brooklyn team.   This note was scribed by Beckey Rutter NP. However, I personally saw the patient alone, interviewed, examined, and counseled Amanda Hardin.   Santiago Glad, MD  CC:  Prentice Docker, MD

## 2018-05-29 ENCOUNTER — Telehealth: Payer: Self-pay | Admitting: Obstetrics and Gynecology

## 2018-05-29 NOTE — Telephone Encounter (Signed)
Patient is aware of H&P at [facility] on 06/10/18 @ 11:30am, Pre-admit Testing afterwards, and OR on 06/18/18. Patient is aware to expect calls from the [facility] and [facility]. Patient confirmed BCBS and insurance info discussed. Patient is aware if she has a change of insurance she must call me asap. Patient would like a call from Dr. Glennon Mac to discuss surgery and down time. Ext given.

## 2018-05-30 ENCOUNTER — Telehealth: Payer: Self-pay | Admitting: Obstetrics and Gynecology

## 2018-05-30 NOTE — Telephone Encounter (Signed)
She wants to know about recovery, which we discussed. She just bought a house and is worried about insurance and worried deductibles.  Discussed down time. She seems unsure of proceeding on 7/3.  I asked her to speak with Izora Gala in my office to figure out what date would work best for her. I asked her to bear in mind any conversation she had with Dr. Theora Gianotti in terms of concern for malignancy and timing of surgery. She voiced understanding and agreement with my recommendations.

## 2018-06-10 ENCOUNTER — Inpatient Hospital Stay: Admission: RE | Admit: 2018-06-10 | Payer: BLUE CROSS/BLUE SHIELD | Source: Ambulatory Visit

## 2018-06-10 ENCOUNTER — Encounter: Payer: Self-pay | Admitting: Obstetrics and Gynecology

## 2018-06-10 ENCOUNTER — Ambulatory Visit (INDEPENDENT_AMBULATORY_CARE_PROVIDER_SITE_OTHER): Payer: BLUE CROSS/BLUE SHIELD | Admitting: Obstetrics and Gynecology

## 2018-06-10 VITALS — BP 124/78 | HR 59 | Ht 60.0 in | Wt 164.0 lb

## 2018-06-10 DIAGNOSIS — D251 Intramural leiomyoma of uterus: Secondary | ICD-10-CM

## 2018-06-10 DIAGNOSIS — N83201 Unspecified ovarian cyst, right side: Secondary | ICD-10-CM

## 2018-06-10 DIAGNOSIS — N83202 Unspecified ovarian cyst, left side: Secondary | ICD-10-CM

## 2018-06-10 DIAGNOSIS — D219 Benign neoplasm of connective and other soft tissue, unspecified: Secondary | ICD-10-CM

## 2018-06-10 NOTE — Progress Notes (Signed)
Patient states she would like to reschedule surgery. This visit is a no charge visit. Will reschedule the visit proximate to the surgery date.   Prentice Docker, MD, [facility], [facility] 06/10/2018 2:13 PM

## 2018-06-11 ENCOUNTER — Telehealth: Payer: Self-pay | Admitting: Obstetrics and Gynecology

## 2018-06-11 NOTE — Telephone Encounter (Signed)
Patient is aware of H&P at [facility] on 06/30/18 @ 10:50am w/ Dr. Glennon Mac, Pre-admit Testing afterwards, and OR rescheduled on 07/09/18. Patient is aware she may receive calls from the [facility]. Patient would like Dr. Glennon Mac to prescribe post-op medications so she can get them from the pharmacy ahead of surgery.

## 2018-06-30 ENCOUNTER — Encounter
Admission: RE | Admit: 2018-06-30 | Discharge: 2018-06-30 | Disposition: A | Discharge status: Home / Self Care | Payer: BLUE CROSS/BLUE SHIELD | Source: Ambulatory Visit | Attending: Obstetrics and Gynecology | Admitting: Obstetrics and Gynecology

## 2018-06-30 ENCOUNTER — Other Ambulatory Visit: Payer: Self-pay

## 2018-06-30 ENCOUNTER — Encounter: Payer: Self-pay | Admitting: Obstetrics and Gynecology

## 2018-06-30 ENCOUNTER — Encounter: Payer: BLUE CROSS/BLUE SHIELD | Admitting: Obstetrics and Gynecology

## 2018-06-30 ENCOUNTER — Ambulatory Visit (INDEPENDENT_AMBULATORY_CARE_PROVIDER_SITE_OTHER): Payer: BLUE CROSS/BLUE SHIELD | Admitting: Obstetrics and Gynecology

## 2018-06-30 VITALS — BP 114/68 | HR 67 | Ht 60.0 in | Wt 159.0 lb

## 2018-06-30 DIAGNOSIS — N83201 Unspecified ovarian cyst, right side: Secondary | ICD-10-CM | POA: Diagnosis not present

## 2018-06-30 DIAGNOSIS — Z01810 Encounter for preprocedural cardiovascular examination: Secondary | ICD-10-CM | POA: Insufficient documentation

## 2018-06-30 DIAGNOSIS — D251 Intramural leiomyoma of uterus: Secondary | ICD-10-CM

## 2018-06-30 DIAGNOSIS — N83202 Unspecified ovarian cyst, left side: Secondary | ICD-10-CM

## 2018-06-30 DIAGNOSIS — R9431 Abnormal electrocardiogram [ECG] [EKG]: Secondary | ICD-10-CM | POA: Insufficient documentation

## 2018-06-30 DIAGNOSIS — I10 Essential (primary) hypertension: Secondary | ICD-10-CM | POA: Insufficient documentation

## 2018-06-30 DIAGNOSIS — Z01812 Encounter for preprocedural laboratory examination: Secondary | ICD-10-CM | POA: Insufficient documentation

## 2018-06-30 HISTORY — DX: Gastro-esophageal reflux disease without esophagitis: K21.9

## 2018-06-30 LAB — COMPREHENSIVE METABOLIC PANEL
ALBUMIN: 4.2 g/dL (ref 3.5–5.0)
ALT: 8 U/L (ref 0–44)
AST: 22 U/L (ref 15–41)
Alkaline Phosphatase: 56 U/L (ref 38–126)
Anion gap: 10 (ref 5–15)
BILIRUBIN TOTAL: 0.8 mg/dL (ref 0.3–1.2)
BUN: 27 mg/dL — AB (ref 8–23)
CALCIUM: 9.1 mg/dL (ref 8.9–10.3)
CO2: 27 mmol/L (ref 22–32)
CREATININE: 1.17 mg/dL — AB (ref 0.44–1.00)
Chloride: 105 mmol/L (ref 98–111)
GFR calc Af Amer: 56 mL/min — ABNORMAL LOW (ref >60)
GFR calc non Af Amer: 48 mL/min — ABNORMAL LOW (ref >60)
GLUCOSE: 92 mg/dL (ref 70–99)
Potassium: 3.9 mmol/L (ref 3.5–5.1)
SODIUM: 142 mmol/L (ref 135–145)
Total Protein: 7.8 g/dL (ref 6.5–8.1)

## 2018-06-30 LAB — TYPE AND SCREEN
ABO/RH(D): O POS
Antibody Screen: NEGATIVE

## 2018-06-30 LAB — CBC
HEMATOCRIT: 37.1 % (ref 35.0–47.0)
HEMOGLOBIN: 12.1 g/dL (ref 12.0–16.0)
MCH: 27.6 pg (ref 26.0–34.0)
MCHC: 32.6 g/dL (ref 32.0–36.0)
MCV: 84.5 fL (ref 80.0–100.0)
Platelets: 270 10*3/uL (ref 150–440)
RBC: 4.39 MIL/uL (ref 3.80–5.20)
RDW: 15.2 % — ABNORMAL HIGH (ref 11.5–14.5)
WBC: 6.5 10*3/uL (ref 3.6–11.0)

## 2018-06-30 NOTE — Pre-Procedure Instructions (Signed)
Abnormal EKG brought to the attention of Dr. Andree Elk (anesthesia); requested medical clearance prior to surgery. Request form and EKG faxed to Dr. Lamonte Sakai.

## 2018-06-30 NOTE — Patient Instructions (Addendum)
Your procedure is scheduled on: Wednesday, July 09, 2018 Report to Day Surgery on the 2nd floor of the [facility]. To find out your arrival time, please call [phone] between 1PM - 3PM on: Tuesday, July 08, 2018  REMEMBER: Instructions that are not followed completely may result in serious medical risk, up to and including death; or upon the discretion of your surgeon and anesthesiologist your surgery may need to be rescheduled.  Do not eat food after midnight the night before your procedure.  No gum chewing, lozengers or hard candies.  You may however, drink CLEAR liquids up to 2 hours before you are scheduled to arrive for your surgery. Do not drink anything within 2 hours of the start of your surgery.  Clear liquids include: - water  - apple juice without pulp - clear gatorade - black coffee or tea (Do NOT add anything to the coffee or tea) Do NOT drink anything that is not on this list.  No Alcohol for 24 hours before or after surgery.  No Smoking including e-cigarettes for 24 hours prior to surgery.  No chewable tobacco products for at least 6 hours prior to surgery.  No nicotine patches on the day of surgery.  On the morning of surgery brush your teeth with toothpaste and water, you may rinse your mouth with mouthwash if you wish. Do not swallow any toothpaste or mouthwash.  Notify your doctor if there is any change in your medical condition (cold, fever, infection).  Do not wear jewelry, make-up, hairpins, clips or nail polish.  Do not wear lotions, powders, or perfumes. You may wear deodorant.  Do not shave 48 hours prior to surgery.   Contacts and dentures may not be worn into surgery.  Do not bring valuables to the hospital, including drivers license, insurance or credit cards.  [facility] is not responsible for any belongings or valuables.   TAKE THESE MEDICATIONS THE MORNING OF SURGERY:  1.  PANTOPRAZOLE (take one the night before surgery and one the  morning of surgery - helps to prevent nausea after surgery) 2.  LYRICA 3.  ZOLOFT  Use CHG Soap as directed on instruction sheet.  ON July 17 - Stop Anti-inflammatories (NSAIDS) such as Advil, Aleve, Ibuprofen, Motrin, Naproxen, Naprosyn and Aspirin based products such as Excedrin, Goodys Powder, BC Powder. (May take Tylenol or Acetaminophen if needed.)  ON July 17 - Stop ANY OVER THE COUNTER supplements until after surgery.  Wear comfortable clothing (specific to your surgery type) to the hospital.  Plan for stool softeners for home use.  If you are being admitted to the hospital overnight, leave your suitcase in the car. After surgery it may be brought to your room.  If you are being discharged the day of surgery, you will not be allowed to drive home. You will need a responsible adult to drive you home and stay with you that night.   If you are taking public transportation, you will need to have a responsible adult with you. Please confirm with your physician that it is acceptable to use public transportation.   Please call [phone] if you have any questions about these instructions.

## 2018-07-01 ENCOUNTER — Encounter: Payer: Self-pay | Admitting: Obstetrics and Gynecology

## 2018-07-01 NOTE — Progress Notes (Signed)
Preoperative History and Physical  Amanda Hardin is a 62 y.o. G1P0101 here for surgical management of uterine fibroid, increasing in size and bilateral ovarian cysts.   No significant preoperative concerns.  History of Present Illness: She has been followed for several years (initially in [place]) and now for the past year or so here for the above. She is symptomatic of left lower quadrant pain.  She had a normal CA-125 (18.5) and LDH (130).    04/17/2018- MR PELVIS W WO CONTRAST 1. Dominant markedly heterogeneous fat-containing 10.0 cm transmural right uterine body mass, which has increased in size since 2018 pelvic sonogram. 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 Surgical consultation advised. 2. Large simple 8.2 cm right ovarian cyst. Mildly complex septated 4.8 cm left ovarian cyst. 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 Continued sonographic surveillance advised annually if not resected. 3. Numerous smaller typical uterine fibroids.  Historical (from records reviewed): 07/17/13: Ultrasound report largest fibroid 8 x 5.6 x 7.1 cm Simple cyst noted (laterality not specified): 7.3 cm 11/10/13: ultrasound report Fibroids: 4.6 x 5.4 cm, 1.8 x 2.0 xcm, 5.4 x 6.8 cm No cysts mentioned 11/26/2016: CT report Fibroid: 7.7 x 6.7 x 7 cm Cyst (possibly from right ovary): 7.5 x 7.5 x 7.7 cm 01/25/2017: Ultrasound  Uterus:12.0 x 8.3 x 8.6 cm; several fibroids, largest is 8.0 x 7.1 x 6.3 cm. Several other 2-3cm fibroids mentioned in report. Adnexa:cyst seen Right ovary: 8.4 x 8.7 x 8.4 cm simple appearing cyst Left ovary: 4.3 x 2.9 x 3.8 cm simple appearing cyst 05/27/17: Ultrasound  Uterus:12.5 x 7.9 x 9.2 cm.  Multiple uterine fibroids, including a dominant 7.4 x 8.2 x 5.7 cm intramural fibroid along the right posterior uterine body, previously 8.0 cm, grossly unchanged. Additional smaller uterine fibroids measuring up to 4.7 cm in the left anterior uterine body. Adnexa:cyst seen Right ovary: ?7.7 x 7.6 x 7.5 cm simple appearing cyst - decreased Left ovary: 4.4 x 4.2 x 3.9 cm  simple appearing cyst - stable No abnormal free fluid. 01/14/17: Ultrasound  Uterus:11.9 x 5.4 x 10.1 cm. Two fibroids including a dominant 10.5 cm right fundal fibroid. Anterior left fundal 2.4 cm fibroid. The dominant fibroid has enlarged a slightly since prior study when this measured up to 8.2 cm. Adnexa:cyst seen Right ovary: 9.0 x 8.1 x 9.3 cm simple appearing cyst - increased Left ovary: 4.0 x 3.6 x 3.2 cm  simple appearing cyst - stable No abnormal free fluid.  01/08/18-  Pap- Negative for intraepithelial lesion or malignancy HPV Aptima- positive. Negative for HPV genotype 16, 18, and 45.   Proposed surgery: Robot assisted Total Laparoscopic Hysterectomy, bilateral salpingo-oophorectomy, possible need for extension of one of the incisions to remove uterus (given small vaginal opening), possible need for laparotomy, possible need for cystoscopy.  Past Medical History:  Diagnosis Date  . Anxiety   . Arthritis   . Cough   . Depression   . GERD (gastroesophageal reflux disease)   . Hyperlipidemia   . Hypertension   . Thyroid nodule   . Torn meniscus    R    Past Surgical History:  Procedure Laterality Date  . BREAST BIOPSY Left 09/2015   [place] core bx  NEG   . BREAST BIOPSY Right    both core and excisional done, multiples  . BREAST BIOPSY Left 2014  .  CESAREAN SECTION    . TONSILLECTOMY     OB History  Gravida Para Term Preterm AB Living  1 1 0 1 0 1  SAB TAB Ectopic Multiple Live Births  0 0 0 0 1    # Outcome Date GA Lbr Len/2nd Weight Sex  Delivery Anes PTL Lv  1 Preterm  [redacted]w[redacted]d  7 lb (3.175 kg) M CS-LTranv  Y LIV  Patient denies any other pertinent gynecologic issues.   Current Outpatient Medications on File Prior to Visit  Medication Sig Dispense Refill  . chlorthalidone (HYGROTON) 25 MG tablet Take 25 mg by mouth once daily  3  . enalapril (VASOTEC) 10 MG tablet Take 10 mg by mouth daily.     . meclizine (ANTIVERT) 25 MG tablet Take 25 mg by mouth twice daily as needed for dizziness    . meloxicam (MOBIC) 15 MG tablet Take 15 mg by mouth once daily prn    . pantoprazole (PROTONIX) 40 MG tablet Take 40 mg by mouth daily.    . pravastatin (PRAVACHOL) 40 MG tablet Take 40 mg by mouth daily.     . pregabalin (LYRICA) 150 MG capsule Take 150 mg by mouth 3 (three) times daily.     . sertraline (ZOLOFT) 100 MG tablet Take 100 mg by mouth daily.   3  . valACYclovir (VALTREX) 500 MG tablet Take 500 mg by mouth daily.     Vladimir Faster Glycol-Propyl Glycol (SYSTANE OP) Place 1 drop into both eyes daily as needed (dry eyes).     No current facility-administered medications on file prior to visit.    Allergies  Allergen Reactions  . Norco [Hydrocodone-Acetaminophen] Nausea And Vomiting  . Penicillin G     Childhood allergy Has patient had a PCN reaction causing immediate rash, facial/tongue/throat swelling, SOB or lightheadedness with hypotension: Unknown Has patient had a PCN reaction causing severe rash involving mucus membranes or skin necrosis: Unknown Has patient had a PCN reaction that required hospitalization: Unknown Has patient had a PCN reaction occurring within the last 10 years: No If all of the above answers are "NO", then may proceed with Cephalosporin use.     Social History:   reports that she quit smoking about 16 years ago. Her smoking use included cigarettes. She quit after 25.00 years of use. She has never used smokeless tobacco. She reports that she does not drink alcohol or use drugs.  Family History    Problem Relation Age of Onset  . Diabetes Maternal Aunt   . Hypertension Maternal Aunt   . Prostate cancer Father   . Heart failure Father   . Pancreatic cancer Paternal Aunt   . Breast cancer Neg Hx   . Bladder Cancer Neg Hx   . Kidney cancer Neg Hx     Review of Systems: Noncontributory  PHYSICAL EXAM: Blood pressure 114/68, pulse 67, height 5' (1.524 m), weight 159 lb (72.1 kg), SpO2 97 %. CONSTITUTIONAL: Well-developed, well-nourished female in no acute distress.  HENT:  Normocephalic, atraumatic, External right and left ear normal. Oropharynx is clear and moist EYES: Conjunctivae and EOM are normal. Pupils are equal, round, and reactive to light. No scleral icterus.  NECK: Normal range of motion, supple, no masses SKIN: Skin is warm and dry. No rash noted. Not diaphoretic. No erythema. No pallor. [place]: Alert and oriented to person, place, and time. Normal reflexes, muscle tone coordination. No cranial nerve deficit noted. PSYCHIATRIC: Normal mood and affect. Normal behavior. Normal judgment and  thought content. CARDIOVASCULAR: Normal heart rate noted, regular rhythm RESPIRATORY: Effort and breath sounds normal, no problems with respiration noted ABDOMEN: Soft, nontender, nondistended. PELVIC: Deferred MUSCULOSKELETAL: Normal range of motion. No edema and no tenderness. 2+ distal pulses.  Labs: Results for orders placed or performed during the hospital encounter of 06/30/18 (from the past 336 hour(s))  CBC   Collection Time: 06/30/18  1:11 PM  Result Value Ref Range   WBC 6.5 3.6 - 11.0 K/uL   RBC 4.39 3.80 - 5.20 MIL/uL   Hemoglobin 12.1 12.0 - 16.0 g/dL   HCT 37.1 35.0 - 47.0 %   MCV 84.5 80.0 - 100.0 fL   MCH 27.6 26.0 - 34.0 pg   MCHC 32.6 32.0 - 36.0 g/dL   RDW 15.2 (H) 11.5 - 14.5 %   Platelets 270 150 - 440 K/uL  Comprehensive metabolic panel   Collection Time: 06/30/18  1:11 PM  Result Value Ref Range   Sodium 142 135 - 145 mmol/L   Potassium 3.9 3.5 -  5.1 mmol/L   Chloride 105 98 - 111 mmol/L   CO2 27 22 - 32 mmol/L   Glucose, Bld 92 70 - 99 mg/dL   BUN 27 (H) 8 - 23 mg/dL   Creatinine, Ser 1.17 (H) 0.44 - 1.00 mg/dL   Calcium 9.1 8.9 - 10.3 mg/dL   Total Protein 7.8 6.5 - 8.1 g/dL   Albumin 4.2 3.5 - 5.0 g/dL   AST 22 15 - 41 U/L   ALT 8 0 - 44 U/L   Alkaline Phosphatase 56 38 - 126 U/L   Total Bilirubin 0.8 0.3 - 1.2 mg/dL   GFR calc non Af Amer 48 (L) >60 mL/min   GFR calc Af Amer 56 (L) >60 mL/min   Anion gap 10 5 - 15  Type and screen [place]   Collection Time: 06/30/18  1:11 PM  Result Value Ref Range   ABO/RH(D) O POS    Antibody Screen NEG    Sample Expiration 07/14/2018    Extend sample reason      NO TRANSFUSIONS OR PREGNANCY IN THE PAST 3 MONTHS Performed at [facility], [address], [place], [place] [address]    Assessment: Patient Active Problem List   Diagnosis Date Noted  . Intramural leiomyoma of uterus 03/19/2018  . Leiomyoma 02/20/2017  . Bilateral ovarian cysts 02/20/2017    Plan: Patient will undergo surgical management with the above surgery.   The risks of surgery were discussed in detail with the patient including but not limited to: bleeding which may require transfusion or reoperation; infection which may require antibiotics; injury to surrounding organs which may involve bowel, bladder, ureters ; need for additional procedures including laparoscopy or laparotomy; thromboembolic phenomenon, surgical site problems and other postoperative/anesthesia complications. Likelihood of success in alleviating the patient's condition was discussed. Routine postoperative instructions will be reviewed with the patient and her family in detail after surgery.  The patient concurred with the proposed plan, giving informed written consent for the surgery.  Preoperative prophylactic antibiotics, as indicated, and SCDs ordered on call to the OR.  The surgery will be performed with Dr.  Theora Gianotti.   Prentice Docker, MD 07/01/2018 1:15 PM

## 2018-07-01 NOTE — Pre-Procedure Instructions (Signed)
Met C results sent to Dr. Glennon Mac and Anesthesia for review.

## 2018-07-01 NOTE — H&P (View-Only) (Signed)
Preoperative History and Physical  Amanda Hardin is a 62 y.o. G1P0101 here for surgical management of uterine fibroid, increasing in size and bilateral ovarian cysts.   No significant preoperative concerns.  History of Present Illness: She has been followed for several years (initially in [place]) and now for the past year or so here for the above. She is symptomatic of left lower quadrant pain.  She had a normal CA-125 (18.5) and LDH (130).    04/17/2018- MR PELVIS W WO CONTRAST 1. Dominant markedly heterogeneous fat-containing 10.0 cm transmural right uterine body mass, which has increased in size since 2018 pelvic sonogram. 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 Surgical consultation advised. 2. Large simple 8.2 cm right ovarian cyst. Mildly complex septated 4.8 cm left ovarian cyst. 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 Continued sonographic surveillance advised annually if not resected. 3. Numerous smaller typical uterine fibroids.  Historical (from records reviewed): 07/17/13: Ultrasound report largest fibroid 8 x 5.6 x 7.1 cm Simple cyst noted (laterality not specified): 7.3 cm 11/10/13: ultrasound report Fibroids: 4.6 x 5.4 cm, 1.8 x 2.0 xcm, 5.4 x 6.8 cm No cysts mentioned 11/26/2016: CT report Fibroid: 7.7 x 6.7 x 7 cm Cyst (possibly from right ovary): 7.5 x 7.5 x 7.7 cm 01/25/2017: Ultrasound  Uterus:12.0 x 8.3 x 8.6 cm; several fibroids, largest is 8.0 x 7.1 x 6.3 cm. Several other 2-3cm fibroids mentioned in report. Adnexa:cyst seen Right ovary: 8.4 x 8.7 x 8.4 cm simple appearing cyst Left ovary: 4.3 x 2.9 x 3.8 cm simple appearing cyst 05/27/17: Ultrasound  Uterus:12.5 x 7.9 x 9.2 cm.  Multiple uterine fibroids, including a dominant 7.4 x 8.2 x 5.7 cm intramural fibroid along the right posterior uterine body, previously 8.0 cm, grossly unchanged. Additional smaller uterine fibroids measuring up to 4.7 cm in the left anterior uterine body. Adnexa:cyst seen Right ovary: ?7.7 x 7.6 x 7.5 cm simple appearing cyst - decreased Left ovary: 4.4 x 4.2 x 3.9 cm  simple appearing cyst - stable No abnormal free fluid. 01/14/17: Ultrasound  Uterus:11.9 x 5.4 x 10.1 cm. Two fibroids including a dominant 10.5 cm right fundal fibroid. Anterior left fundal 2.4 cm fibroid. The dominant fibroid has enlarged a slightly since prior study when this measured up to 8.2 cm. Adnexa:cyst seen Right ovary: 9.0 x 8.1 x 9.3 cm simple appearing cyst - increased Left ovary: 4.0 x 3.6 x 3.2 cm  simple appearing cyst - stable No abnormal free fluid.  01/08/18-  Pap- Negative for intraepithelial lesion or malignancy HPV Aptima- positive. Negative for HPV genotype 16, 18, and 45.   Proposed surgery: Robot assisted Total Laparoscopic Hysterectomy, bilateral salpingo-oophorectomy, possible need for extension of one of the incisions to remove uterus (given small vaginal opening), possible need for laparotomy, possible need for cystoscopy.  Past Medical History:  Diagnosis Date  . Anxiety   . Arthritis   . Cough   . Depression   . GERD (gastroesophageal reflux disease)   . Hyperlipidemia   . Hypertension   . Thyroid nodule   . Torn meniscus    R    Past Surgical History:  Procedure Laterality Date  . BREAST BIOPSY Left 09/2015   [place] core bx  NEG   . BREAST BIOPSY Right    both core and excisional done, multiples  . BREAST BIOPSY Left 2014  .  CESAREAN SECTION    . TONSILLECTOMY     OB History  Gravida Para Term Preterm AB Living  1 1 0 1 0 1  SAB TAB Ectopic Multiple Live Births  0 0 0 0 1    # Outcome Date GA Lbr Len/2nd Weight Sex  Delivery Anes PTL Lv  1 Preterm  [redacted]w[redacted]d  7 lb (3.175 kg) M CS-LTranv  Y LIV  Patient denies any other pertinent gynecologic issues.   Current Outpatient Medications on File Prior to Visit  Medication Sig Dispense Refill  . chlorthalidone (HYGROTON) 25 MG tablet Take 25 mg by mouth once daily  3  . enalapril (VASOTEC) 10 MG tablet Take 10 mg by mouth daily.     . meclizine (ANTIVERT) 25 MG tablet Take 25 mg by mouth twice daily as needed for dizziness    . meloxicam (MOBIC) 15 MG tablet Take 15 mg by mouth once daily prn    . pantoprazole (PROTONIX) 40 MG tablet Take 40 mg by mouth daily.    . pravastatin (PRAVACHOL) 40 MG tablet Take 40 mg by mouth daily.     . pregabalin (LYRICA) 150 MG capsule Take 150 mg by mouth 3 (three) times daily.     . sertraline (ZOLOFT) 100 MG tablet Take 100 mg by mouth daily.   3  . valACYclovir (VALTREX) 500 MG tablet Take 500 mg by mouth daily.     Vladimir Faster Glycol-Propyl Glycol (SYSTANE OP) Place 1 drop into both eyes daily as needed (dry eyes).     No current facility-administered medications on file prior to visit.    Allergies  Allergen Reactions  . Norco [Hydrocodone-Acetaminophen] Nausea And Vomiting  . Penicillin G     Childhood allergy Has patient had a PCN reaction causing immediate rash, facial/tongue/throat swelling, SOB or lightheadedness with hypotension: Unknown Has patient had a PCN reaction causing severe rash involving mucus membranes or skin necrosis: Unknown Has patient had a PCN reaction that required hospitalization: Unknown Has patient had a PCN reaction occurring within the last 10 years: No If all of the above answers are "NO", then may proceed with Cephalosporin use.     Social History:   reports that she quit smoking about 16 years ago. Her smoking use included cigarettes. She quit after 25.00 years of use. She has never used smokeless tobacco. She reports that she does not drink alcohol or use drugs.  Family History    Problem Relation Age of Onset  . Diabetes Maternal Aunt   . Hypertension Maternal Aunt   . Prostate cancer Father   . Heart failure Father   . Pancreatic cancer Paternal Aunt   . Breast cancer Neg Hx   . Bladder Cancer Neg Hx   . Kidney cancer Neg Hx     Review of Systems: Noncontributory  PHYSICAL EXAM: Blood pressure 114/68, pulse 67, height 5' (1.524 m), weight 159 lb (72.1 kg), SpO2 97 %. CONSTITUTIONAL: Well-developed, well-nourished female in no acute distress.  HENT:  Normocephalic, atraumatic, External right and left ear normal. Oropharynx is clear and moist EYES: Conjunctivae and EOM are normal. Pupils are equal, round, and reactive to light. No scleral icterus.  NECK: Normal range of motion, supple, no masses SKIN: Skin is warm and dry. No rash noted. Not diaphoretic. No erythema. No pallor. [place]: Alert and oriented to person, place, and time. Normal reflexes, muscle tone coordination. No cranial nerve deficit noted. PSYCHIATRIC: Normal mood and affect. Normal behavior. Normal judgment and  thought content. CARDIOVASCULAR: Normal heart rate noted, regular rhythm RESPIRATORY: Effort and breath sounds normal, no problems with respiration noted ABDOMEN: Soft, nontender, nondistended. PELVIC: Deferred MUSCULOSKELETAL: Normal range of motion. No edema and no tenderness. 2+ distal pulses.  Labs: Results for orders placed or performed during the hospital encounter of 06/30/18 (from the past 336 hour(s))  CBC   Collection Time: 06/30/18  1:11 PM  Result Value Ref Range   WBC 6.5 3.6 - 11.0 K/uL   RBC 4.39 3.80 - 5.20 MIL/uL   Hemoglobin 12.1 12.0 - 16.0 g/dL   HCT 37.1 35.0 - 47.0 %   MCV 84.5 80.0 - 100.0 fL   MCH 27.6 26.0 - 34.0 pg   MCHC 32.6 32.0 - 36.0 g/dL   RDW 15.2 (H) 11.5 - 14.5 %   Platelets 270 150 - 440 K/uL  Comprehensive metabolic panel   Collection Time: 06/30/18  1:11 PM  Result Value Ref Range   Sodium 142 135 - 145 mmol/L   Potassium 3.9 3.5 -  5.1 mmol/L   Chloride 105 98 - 111 mmol/L   CO2 27 22 - 32 mmol/L   Glucose, Bld 92 70 - 99 mg/dL   BUN 27 (H) 8 - 23 mg/dL   Creatinine, Ser 1.17 (H) 0.44 - 1.00 mg/dL   Calcium 9.1 8.9 - 10.3 mg/dL   Total Protein 7.8 6.5 - 8.1 g/dL   Albumin 4.2 3.5 - 5.0 g/dL   AST 22 15 - 41 U/L   ALT 8 0 - 44 U/L   Alkaline Phosphatase 56 38 - 126 U/L   Total Bilirubin 0.8 0.3 - 1.2 mg/dL   GFR calc non Af Amer 48 (L) >60 mL/min   GFR calc Af Amer 56 (L) >60 mL/min   Anion gap 10 5 - 15  Type and screen [place]   Collection Time: 06/30/18  1:11 PM  Result Value Ref Range   ABO/RH(D) O POS    Antibody Screen NEG    Sample Expiration 07/14/2018    Extend sample reason      NO TRANSFUSIONS OR PREGNANCY IN THE PAST 3 MONTHS Performed at [facility], [address], [place], [place] [address]    Assessment: Patient Active Problem List   Diagnosis Date Noted  . Intramural leiomyoma of uterus 03/19/2018  . Leiomyoma 02/20/2017  . Bilateral ovarian cysts 02/20/2017    Plan: Patient will undergo surgical management with the above surgery.   The risks of surgery were discussed in detail with the patient including but not limited to: bleeding which may require transfusion or reoperation; infection which may require antibiotics; injury to surrounding organs which may involve bowel, bladder, ureters ; need for additional procedures including laparoscopy or laparotomy; thromboembolic phenomenon, surgical site problems and other postoperative/anesthesia complications. Likelihood of success in alleviating the patient's condition was discussed. Routine postoperative instructions will be reviewed with the patient and her family in detail after surgery.  The patient concurred with the proposed plan, giving informed written consent for the surgery.  Preoperative prophylactic antibiotics, as indicated, and SCDs ordered on call to the OR.  The surgery will be performed with Dr.  Theora Gianotti.   Prentice Docker, MD 07/01/2018 1:15 PM

## 2018-07-02 ENCOUNTER — Encounter: Payer: Self-pay | Admitting: Obstetrics and Gynecology

## 2018-07-07 ENCOUNTER — Other Ambulatory Visit: Payer: Self-pay | Admitting: Obstetrics and Gynecology

## 2018-07-07 ENCOUNTER — Telehealth: Payer: Self-pay

## 2018-07-07 DIAGNOSIS — D219 Benign neoplasm of connective and other soft tissue, unspecified: Secondary | ICD-10-CM

## 2018-07-07 DIAGNOSIS — N83202 Unspecified ovarian cyst, left side: Secondary | ICD-10-CM

## 2018-07-07 DIAGNOSIS — N83201 Unspecified ovarian cyst, right side: Secondary | ICD-10-CM

## 2018-07-07 MED ORDER — TRAMADOL HCL 50 MG PO TABS
50.0000 mg | ORAL_TABLET | Freq: Four times a day (QID) | ORAL | 0 refills | Status: DC | PRN
Start: 2018-07-07 — End: 2020-11-21

## 2018-07-07 MED ORDER — IBUPROFEN 600 MG PO TABS: 600.0000 mg | ORAL_TABLET | Freq: Four times a day (QID) | ORAL | 0 refills | Status: DC | PRN

## 2018-07-07 MED ORDER — ONDANSETRON 8 MG PO TBDP: 8.0000 mg | ORAL_TABLET | Freq: Three times a day (TID) | ORAL | 0 refills | Status: DC | PRN

## 2018-07-07 MED ORDER — OXYCODONE-ACETAMINOPHEN 5-325 MG PO TABS: 1.0000 | ORAL_TABLET | Freq: Four times a day (QID) | ORAL | 0 refills | Status: DC | PRN

## 2018-07-07 NOTE — Telephone Encounter (Signed)
Pt states she is scheduled for a procedure but had to have some testing done (EKG, Stress test & Echo). Pt inquiring if [facility] received the [phone]

## 2018-07-07 NOTE — Pre-Procedure Instructions (Signed)
CARDIAC CLEARANCE ON CHART 

## 2018-07-09 ENCOUNTER — Encounter
Admission: RE | Disposition: A | Discharge status: Home / Self Care | Payer: Self-pay | Source: Ambulatory Visit | Attending: Obstetrics and Gynecology

## 2018-07-09 ENCOUNTER — Ambulatory Visit: Payer: BLUE CROSS/BLUE SHIELD | Admitting: Certified Registered"

## 2018-07-09 ENCOUNTER — Other Ambulatory Visit: Payer: Self-pay

## 2018-07-09 ENCOUNTER — Encounter: Payer: Self-pay | Admitting: *Deleted

## 2018-07-09 ENCOUNTER — Observation Stay
Admission: RE | Admit: 2018-07-09 | Discharge: 2018-07-10 | Disposition: A | Discharge status: Home / Self Care | Payer: BLUE CROSS/BLUE SHIELD | Source: Ambulatory Visit | Attending: Obstetrics and Gynecology | Admitting: Obstetrics and Gynecology

## 2018-07-09 DIAGNOSIS — F329 Major depressive disorder, single episode, unspecified: Secondary | ICD-10-CM | POA: Insufficient documentation

## 2018-07-09 DIAGNOSIS — Z8249 Family history of ischemic heart disease and other diseases of the circulatory system: Secondary | ICD-10-CM | POA: Insufficient documentation

## 2018-07-09 DIAGNOSIS — D259 Leiomyoma of uterus, unspecified: Secondary | ICD-10-CM

## 2018-07-09 DIAGNOSIS — D252 Subserosal leiomyoma of uterus: Secondary | ICD-10-CM | POA: Insufficient documentation

## 2018-07-09 DIAGNOSIS — Z90710 Acquired absence of both cervix and uterus: Secondary | ICD-10-CM | POA: Diagnosis present

## 2018-07-09 DIAGNOSIS — D270 Benign neoplasm of right ovary: Secondary | ICD-10-CM | POA: Insufficient documentation

## 2018-07-09 DIAGNOSIS — I10 Essential (primary) hypertension: Secondary | ICD-10-CM | POA: Insufficient documentation

## 2018-07-09 DIAGNOSIS — Z87891 Personal history of nicotine dependence: Secondary | ICD-10-CM | POA: Insufficient documentation

## 2018-07-09 DIAGNOSIS — N83202 Unspecified ovarian cyst, left side: Secondary | ICD-10-CM | POA: Diagnosis not present

## 2018-07-09 DIAGNOSIS — Z79899 Other long term (current) drug therapy: Secondary | ICD-10-CM | POA: Insufficient documentation

## 2018-07-09 DIAGNOSIS — E785 Hyperlipidemia, unspecified: Secondary | ICD-10-CM | POA: Insufficient documentation

## 2018-07-09 DIAGNOSIS — Z880 Allergy status to penicillin: Secondary | ICD-10-CM | POA: Insufficient documentation

## 2018-07-09 DIAGNOSIS — D251 Intramural leiomyoma of uterus: Principal | ICD-10-CM | POA: Insufficient documentation

## 2018-07-09 DIAGNOSIS — K219 Gastro-esophageal reflux disease without esophagitis: Secondary | ICD-10-CM | POA: Insufficient documentation

## 2018-07-09 DIAGNOSIS — Z885 Allergy status to narcotic agent status: Secondary | ICD-10-CM | POA: Insufficient documentation

## 2018-07-09 DIAGNOSIS — N800 Endometriosis of uterus: Secondary | ICD-10-CM | POA: Insufficient documentation

## 2018-07-09 DIAGNOSIS — Z791 Long term (current) use of non-steroidal anti-inflammatories (NSAID): Secondary | ICD-10-CM | POA: Insufficient documentation

## 2018-07-09 DIAGNOSIS — F419 Anxiety disorder, unspecified: Secondary | ICD-10-CM | POA: Insufficient documentation

## 2018-07-09 DIAGNOSIS — N83201 Unspecified ovarian cyst, right side: Secondary | ICD-10-CM | POA: Diagnosis not present

## 2018-07-09 DIAGNOSIS — D271 Benign neoplasm of left ovary: Secondary | ICD-10-CM | POA: Insufficient documentation

## 2018-07-09 HISTORY — PX: ROBOTIC ASSISTED LAPAROSCOPIC OVARIAN CYSTECTOMY: SHX6081

## 2018-07-09 HISTORY — PX: ROBOTIC ASSISTED TOTAL HYSTERECTOMY WITH BILATERAL SALPINGO OOPHERECTOMY: SHX6086

## 2018-07-09 HISTORY — PX: CYSTOSCOPY: SHX5120

## 2018-07-09 LAB — COMPREHENSIVE METABOLIC PANEL
ALT: 12 U/L (ref 0–44)
ANION GAP: 8 (ref 5–15)
AST: 32 U/L (ref 15–41)
Albumin: 3.3 g/dL — ABNORMAL LOW (ref 3.5–5.0)
Alkaline Phosphatase: 51 U/L (ref 38–126)
BUN: 13 mg/dL (ref 8–23)
CHLORIDE: 104 mmol/L (ref 98–111)
CO2: 28 mmol/L (ref 22–32)
Calcium: 8.6 mg/dL — ABNORMAL LOW (ref 8.9–10.3)
Creatinine, Ser: 1.12 mg/dL — ABNORMAL HIGH (ref 0.44–1.00)
GFR calc non Af Amer: 51 mL/min — ABNORMAL LOW (ref >60)
GFR, EST AFRICAN AMERICAN: 59 mL/min — AB (ref >60)
Glucose, Bld: 158 mg/dL — ABNORMAL HIGH (ref 70–99)
POTASSIUM: 3.5 mmol/L (ref 3.5–5.1)
SODIUM: 140 mmol/L (ref 135–145)
Total Bilirubin: 0.4 mg/dL (ref 0.3–1.2)
Total Protein: 6.9 g/dL (ref 6.5–8.1)

## 2018-07-09 LAB — CBC
HCT: 38 % (ref 35.0–47.0)
Hemoglobin: 12.3 g/dL (ref 12.0–16.0)
MCH: 27.7 pg (ref 26.0–34.0)
MCHC: 32.3 g/dL (ref 32.0–36.0)
MCV: 85.9 fL (ref 80.0–100.0)
PLATELETS: 242 10*3/uL (ref 150–440)
RBC: 4.43 MIL/uL (ref 3.80–5.20)
RDW: 15.3 % — AB (ref 11.5–14.5)
WBC: 10.4 10*3/uL (ref 3.6–11.0)

## 2018-07-09 LAB — BASIC METABOLIC PANEL
Anion gap: 8 (ref 5–15)
BUN: 14 mg/dL (ref 8–23)
CALCIUM: 8.5 mg/dL — AB (ref 8.9–10.3)
CO2: 28 mmol/L (ref 22–32)
CREATININE: 1.1 mg/dL — AB (ref 0.44–1.00)
Chloride: 103 mmol/L (ref 98–111)
GFR calc Af Amer: >60 mL/min (ref >60)
GFR, EST NON AFRICAN AMERICAN: 52 mL/min — AB (ref >60)
Glucose, Bld: 161 mg/dL — ABNORMAL HIGH (ref 70–99)
POTASSIUM: 3.4 mmol/L — AB (ref 3.5–5.1)
SODIUM: 139 mmol/L (ref 135–145)

## 2018-07-09 LAB — ABO/RH: ABO/RH(D): O POS

## 2018-07-09 SURGERY — ROBOTIC ASSISTED TOTAL HYSTERECTOMY WITH BILATERAL SALPINGO OOPHORECTOMY
Anesthesia: General | Wound class: Clean Contaminated

## 2018-07-09 MED ORDER — FLUMAZENIL 0.5 MG/5ML IV SOLN
0.2000 mg | Freq: Once | INTRAVENOUS | Status: AC
  Administered 2018-07-09: 0.2 mg via INTRAVENOUS

## 2018-07-09 MED ORDER — PHENYLEPHRINE HCL 10 MG/ML IJ SOLN
INTRAMUSCULAR | Status: AC
  Filled 2018-07-09: qty 1

## 2018-07-09 MED ORDER — ROCURONIUM BROMIDE 50 MG/5ML IV SOLN
INTRAVENOUS | Status: AC
  Filled 2018-07-09: qty 2

## 2018-07-09 MED ORDER — FENTANYL CITRATE (PF) 250 MCG/5ML IJ SOLN
INTRAMUSCULAR | Status: AC
  Filled 2018-07-09: qty 5

## 2018-07-09 MED ORDER — FLUORESCEIN SODIUM 10 % IV SOLN
INTRAVENOUS | Status: AC
  Filled 2018-07-09: qty 5

## 2018-07-09 MED ORDER — KETOROLAC TROMETHAMINE 30 MG/ML IJ SOLN
INTRAMUSCULAR | Status: AC
  Filled 2018-07-09: qty 1

## 2018-07-09 MED ORDER — ALBUTEROL SULFATE HFA 108 (90 BASE) MCG/ACT IN AERS
INHALATION_SPRAY | RESPIRATORY_TRACT | Status: AC
  Filled 2018-07-09: qty 6.7

## 2018-07-09 MED ORDER — PRAVASTATIN SODIUM 40 MG PO TABS
40.0000 mg | ORAL_TABLET | Freq: Every day | ORAL | Status: DC
  Filled 2018-07-09 (×2): qty 1

## 2018-07-09 MED ORDER — GLYCOPYRROLATE 0.2 MG/ML IJ SOLN
INTRAMUSCULAR | Status: AC
  Filled 2018-07-09: qty 1

## 2018-07-09 MED ORDER — FENTANYL CITRATE (PF) 100 MCG/2ML IJ SOLN: 25.0000 ug | INTRAMUSCULAR | Status: DC | PRN

## 2018-07-09 MED ORDER — MIDAZOLAM HCL 2 MG/2ML IJ SOLN
INTRAMUSCULAR | Status: AC
  Filled 2018-07-09: qty 4

## 2018-07-09 MED ORDER — DEXAMETHASONE SODIUM PHOSPHATE 10 MG/ML IJ SOLN
INTRAMUSCULAR | Status: DC | PRN
  Administered 2018-07-09: 10 mg via INTRAVENOUS

## 2018-07-09 MED ORDER — SUGAMMADEX SODIUM 200 MG/2ML IV SOLN
INTRAVENOUS | Status: AC
  Filled 2018-07-09: qty 2

## 2018-07-09 MED ORDER — ALBUTEROL SULFATE HFA 108 (90 BASE) MCG/ACT IN AERS
INHALATION_SPRAY | RESPIRATORY_TRACT | Status: DC | PRN
  Administered 2018-07-09: 5 via RESPIRATORY_TRACT

## 2018-07-09 MED ORDER — ONDANSETRON HCL 4 MG PO TABS: 4.0000 mg | ORAL_TABLET | Freq: Four times a day (QID) | ORAL | Status: DC | PRN

## 2018-07-09 MED ORDER — ONDANSETRON HCL 4 MG/2ML IJ SOLN
INTRAMUSCULAR | Status: DC | PRN
  Administered 2018-07-09 (×2): 4 mg via INTRAVENOUS

## 2018-07-09 MED ORDER — KETOROLAC TROMETHAMINE 30 MG/ML IJ SOLN
INTRAMUSCULAR | Status: DC | PRN
  Administered 2018-07-09: 15 mg via INTRAVENOUS

## 2018-07-09 MED ORDER — CEFAZOLIN SODIUM-DEXTROSE 2-4 GM/100ML-% IV SOLN
INTRAVENOUS | Status: AC
  Filled 2018-07-09: qty 100

## 2018-07-09 MED ORDER — KETAMINE HCL 50 MG/ML IJ SOLN
INTRAMUSCULAR | Status: AC
  Filled 2018-07-09: qty 10

## 2018-07-09 MED ORDER — MIDAZOLAM HCL 2 MG/2ML IJ SOLN
INTRAMUSCULAR | Status: DC | PRN
  Administered 2018-07-09: 4 mg via INTRAVENOUS

## 2018-07-09 MED ORDER — ROCURONIUM BROMIDE 100 MG/10ML IV SOLN
INTRAVENOUS | Status: DC | PRN
  Administered 2018-07-09: 50 mg via INTRAVENOUS
  Administered 2018-07-09 (×2): 10 mg via INTRAVENOUS
  Administered 2018-07-09: 20 mg via INTRAVENOUS

## 2018-07-09 MED ORDER — CEFAZOLIN SODIUM-DEXTROSE 2-4 GM/100ML-% IV SOLN
2.0000 g | INTRAVENOUS | Status: AC
  Administered 2018-07-09: 2 g via INTRAVENOUS

## 2018-07-09 MED ORDER — GLYCOPYRROLATE 0.2 MG/ML IJ SOLN
INTRAMUSCULAR | Status: DC | PRN
  Administered 2018-07-09: 0.1 mg via INTRAVENOUS

## 2018-07-09 MED ORDER — ACETAMINOPHEN 10 MG/ML IV SOLN
INTRAVENOUS | Status: DC | PRN
  Administered 2018-07-09: 1000 mg via INTRAVENOUS

## 2018-07-09 MED ORDER — OXYCODONE-ACETAMINOPHEN 5-325 MG PO TABS: 1.0000 | ORAL_TABLET | ORAL | Status: DC | PRN

## 2018-07-09 MED ORDER — FLUORESCEIN SODIUM 10 % IV SOLN
INTRAVENOUS | Status: DC | PRN
  Administered 2018-07-09: .5 mL via INTRAVENOUS

## 2018-07-09 MED ORDER — ACETAMINOPHEN 10 MG/ML IV SOLN
INTRAVENOUS | Status: AC
  Filled 2018-07-09: qty 100

## 2018-07-09 MED ORDER — ONDANSETRON HCL 4 MG/2ML IJ SOLN
INTRAMUSCULAR | Status: AC
  Filled 2018-07-09: qty 4

## 2018-07-09 MED ORDER — PANTOPRAZOLE SODIUM 40 MG PO TBEC
40.0000 mg | DELAYED_RELEASE_TABLET | Freq: Every day | ORAL | Status: DC
  Administered 2018-07-10: 40 mg via ORAL
  Filled 2018-07-09: qty 1

## 2018-07-09 MED ORDER — ONDANSETRON HCL 4 MG/2ML IJ SOLN
4.0000 mg | Freq: Four times a day (QID) | INTRAMUSCULAR | Status: DC | PRN
  Administered 2018-07-09: 4 mg via INTRAVENOUS
  Filled 2018-07-09: qty 2

## 2018-07-09 MED ORDER — LABETALOL HCL 5 MG/ML IV SOLN
INTRAVENOUS | Status: DC | PRN
  Administered 2018-07-09: 5 mg via INTRAVENOUS

## 2018-07-09 MED ORDER — LIDOCAINE HCL URETHRAL/MUCOSAL 2 % EX GEL
CUTANEOUS | Status: AC
  Filled 2018-07-09: qty 5

## 2018-07-09 MED ORDER — IBUPROFEN 600 MG PO TABS
600.0000 mg | ORAL_TABLET | Freq: Four times a day (QID) | ORAL | Status: DC | PRN
  Administered 2018-07-09 – 2018-07-10 (×2): 600 mg via ORAL
  Filled 2018-07-09 (×2): qty 1

## 2018-07-09 MED ORDER — BUPIVACAINE HCL (PF) 0.5 % IJ SOLN
INTRAMUSCULAR | Status: AC
  Filled 2018-07-09: qty 30

## 2018-07-09 MED ORDER — LACTATED RINGERS IV SOLN
INTRAVENOUS | Status: DC
  Administered 2018-07-09 (×3): via INTRAVENOUS

## 2018-07-09 MED ORDER — PROPOFOL 500 MG/50ML IV EMUL
INTRAVENOUS | Status: AC
  Filled 2018-07-09: qty 50

## 2018-07-09 MED ORDER — HYDROMORPHONE HCL 1 MG/ML IJ SOLN
INTRAMUSCULAR | Status: DC | PRN
  Administered 2018-07-09 (×2): 0.5 mg via INTRAVENOUS

## 2018-07-09 MED ORDER — PROPOFOL 10 MG/ML IV BOLUS
INTRAVENOUS | Status: AC
Start: 2018-07-09 — End: ?
  Filled 2018-07-09: qty 20

## 2018-07-09 MED ORDER — DEXAMETHASONE SODIUM PHOSPHATE 10 MG/ML IJ SOLN
INTRAMUSCULAR | Status: AC
  Filled 2018-07-09: qty 1

## 2018-07-09 MED ORDER — FLUMAZENIL 0.5 MG/5ML IV SOLN
INTRAVENOUS | Status: AC
Start: 2018-07-09 — End: 2018-07-10
  Filled 2018-07-09: qty 5

## 2018-07-09 MED ORDER — LIDOCAINE HCL (CARDIAC) PF 100 MG/5ML IV SOSY
PREFILLED_SYRINGE | INTRAVENOUS | Status: DC | PRN
  Administered 2018-07-09: 30 mg via INTRAVENOUS
  Administered 2018-07-09 (×2): 50 mg via INTRAVENOUS

## 2018-07-09 MED ORDER — INDOCYANINE GREEN 25 MG IV SOLR
INTRAVENOUS | Status: AC
  Filled 2018-07-09: qty 25

## 2018-07-09 MED ORDER — FENTANYL CITRATE (PF) 100 MCG/2ML IJ SOLN
INTRAMUSCULAR | Status: DC | PRN
  Administered 2018-07-09: 100 ug via INTRAVENOUS
  Administered 2018-07-09: 150 ug via INTRAVENOUS

## 2018-07-09 MED ORDER — MENTHOL 3 MG MT LOZG
1.0000 | LOZENGE | OROMUCOSAL | Status: DC | PRN
  Filled 2018-07-09: qty 9

## 2018-07-09 MED ORDER — DOCUSATE SODIUM 100 MG PO CAPS
100.0000 mg | ORAL_CAPSULE | Freq: Two times a day (BID) | ORAL | Status: DC
  Administered 2018-07-09 – 2018-07-10 (×2): 100 mg via ORAL
  Filled 2018-07-09 (×2): qty 1

## 2018-07-09 MED ORDER — FAMOTIDINE 20 MG PO TABS
ORAL_TABLET | ORAL | Status: AC
  Filled 2018-07-09: qty 1

## 2018-07-09 MED ORDER — LACTATED RINGERS IV SOLN
INTRAVENOUS | Status: DC
  Administered 2018-07-09 – 2018-07-10 (×2): via INTRAVENOUS

## 2018-07-09 MED ORDER — PHENYLEPHRINE HCL 10 MG/ML IJ SOLN
INTRAMUSCULAR | Status: DC | PRN
  Administered 2018-07-09: 200 ug via INTRAVENOUS
  Administered 2018-07-09 (×7): 100 ug via INTRAVENOUS

## 2018-07-09 MED ORDER — LIDOCAINE HCL (PF) 2 % IJ SOLN
INTRAMUSCULAR | Status: AC
  Filled 2018-07-09: qty 10

## 2018-07-09 MED ORDER — HYDROMORPHONE HCL 1 MG/ML IJ SOLN
INTRAMUSCULAR | Status: AC
  Filled 2018-07-09: qty 1

## 2018-07-09 MED ORDER — PROPOFOL 10 MG/ML IV BOLUS
INTRAVENOUS | Status: DC | PRN
  Administered 2018-07-09: 130 mg via INTRAVENOUS

## 2018-07-09 MED ORDER — KETAMINE HCL 10 MG/ML IJ SOLN
INTRAMUSCULAR | Status: DC | PRN
  Administered 2018-07-09: 20 mg via INTRAVENOUS
  Administered 2018-07-09: 30 mg via INTRAVENOUS

## 2018-07-09 MED ORDER — HYDROMORPHONE HCL 1 MG/ML IJ SOLN: 1.0000 mg | INTRAMUSCULAR | Status: DC | PRN

## 2018-07-09 MED ORDER — SUGAMMADEX SODIUM 200 MG/2ML IV SOLN
INTRAVENOUS | Status: DC | PRN
  Administered 2018-07-09: 200 mg via INTRAVENOUS

## 2018-07-09 MED ORDER — SIMETHICONE 80 MG PO CHEW: 80.0000 mg | CHEWABLE_TABLET | Freq: Four times a day (QID) | ORAL | Status: DC | PRN

## 2018-07-09 MED ORDER — BUPIVACAINE HCL 0.5 % IJ SOLN
INTRAMUSCULAR | Status: DC | PRN
  Administered 2018-07-09: 10 mL

## 2018-07-09 MED ORDER — SERTRALINE HCL 100 MG PO TABS
100.0000 mg | ORAL_TABLET | Freq: Every day | ORAL | Status: DC
  Administered 2018-07-10: 100 mg via ORAL
  Filled 2018-07-09: qty 1

## 2018-07-09 SURGICAL SUPPLY — 104 items
BAG URINE DRAINAGE (UROLOGICAL SUPPLIES) ×3 IMPLANT
BLADE SURG 11 STRL SS SAFETY (MISCELLANEOUS) ×3 IMPLANT
CANISTER SUCT 1200ML W/VALVE (MISCELLANEOUS) ×3 IMPLANT
CANNULA DILATOR 10 W/SLV (CANNULA) ×6 IMPLANT
CATH FOLEY 2WAY  5CC 16FR (CATHETERS) ×1
CATH FOLEY SIL 2WAY 14FR5CC (CATHETERS) ×3 IMPLANT
CATH URTH 16FR FL 2W BLN LF (CATHETERS) ×2 IMPLANT
CHLORAPREP W/TINT 26ML (MISCELLANEOUS) ×3 IMPLANT
CORD BIP STRL DISP 12FT (MISCELLANEOUS) ×3 IMPLANT
CORD MONOPOLAR M/FML 12FT (MISCELLANEOUS) ×3 IMPLANT
COVER TIP SHEARS 8 DVNC (MISCELLANEOUS) ×4 IMPLANT
COVER TIP SHEARS 8MM DA VINCI (MISCELLANEOUS) ×2
DEFOGGER SCOPE WARMER CLEARIFY (MISCELLANEOUS) ×3 IMPLANT
DERMABOND ADVANCED (GAUZE/BANDAGES/DRESSINGS) ×1
DERMABOND ADVANCED .7 DNX12 (GAUZE/BANDAGES/DRESSINGS) ×2 IMPLANT
DRAPE LAPAROTOMY 100X77 ABD (DRAPES) IMPLANT
DRAPE LAPAROTOMY TRNSV 106X77 (MISCELLANEOUS) IMPLANT
DRAPE LEGGINS SURG 28X43 STRL (DRAPES) ×3 IMPLANT
DRAPE SHEET LG 3/4 BI-LAMINATE (DRAPES) ×6 IMPLANT
DRESSING SURGICEL FIBRLLR 1X2 (HEMOSTASIS) ×2 IMPLANT
DRSG OPSITE POSTOP 4X6 (GAUZE/BANDAGES/DRESSINGS) ×3 IMPLANT
DRSG SURGICEL FIBRILLAR 1X2 (HEMOSTASIS) ×3
DRSG TEGADERM 4X4.75 (GAUZE/BANDAGES/DRESSINGS) ×3 IMPLANT
DRSG TELFA 3X8 NADH (GAUZE/BANDAGES/DRESSINGS) IMPLANT
ELECT BLADE 6 FLAT ULTRCLN (ELECTRODE) IMPLANT
ELECT CAUTERY BLADE 6.4 (BLADE) ×3 IMPLANT
ELECT REM PT RETURN 9FT ADLT (ELECTROSURGICAL) ×3
ELECTRODE REM PT RTRN 9FT ADLT (ELECTROSURGICAL) ×2 IMPLANT
FILTER LAP SMOKE EVAC STRL (MISCELLANEOUS) ×3 IMPLANT
GAUZE SPONGE 4X4 12PLY STRL (GAUZE/BANDAGES/DRESSINGS) IMPLANT
GLOVE BIO SURGEON STRL SZ 6.5 (GLOVE) ×30 IMPLANT
GLOVE BIO SURGEON STRL SZ8 (GLOVE) ×3 IMPLANT
GLOVE INDICATOR 7.0 STRL GRN (GLOVE) ×30 IMPLANT
GOWN STRL REUS W/ TWL LRG LVL3 (GOWN DISPOSABLE) ×8 IMPLANT
GOWN STRL REUS W/ TWL XL LVL3 (GOWN DISPOSABLE) ×2 IMPLANT
GOWN STRL REUS W/TWL LRG LVL3 (GOWN DISPOSABLE) ×4
GOWN STRL REUS W/TWL XL LVL3 (GOWN DISPOSABLE) ×1
GRASPER SUT TROCAR 14GX15 (MISCELLANEOUS) ×3 IMPLANT
HANDLE YANKAUER SUCT BULB TIP (MISCELLANEOUS) ×3 IMPLANT
HOLDER FOLEY CATH W/STRAP (MISCELLANEOUS) IMPLANT
IRRIGATION STRYKERFLOW (MISCELLANEOUS) ×2 IMPLANT
IRRIGATOR STRYKERFLOW (MISCELLANEOUS) ×3
IV NS 100ML SINGLE PACK (IV SOLUTION) ×3 IMPLANT
KIT ACCESSORY DA VINCI DISP (KITS) ×1
KIT ACCESSORY DVNC DISP (KITS) ×2 IMPLANT
KIT PINK PAD W/HEAD ARE REST (MISCELLANEOUS) ×3
KIT PINK PAD W/HEAD ARM REST (MISCELLANEOUS) ×2 IMPLANT
KIT TURNOVER CYSTO (KITS) ×3 IMPLANT
LABEL OR SOLS (LABEL) ×3 IMPLANT
MANIPULATOR VCARE LG CRV RETR (MISCELLANEOUS) IMPLANT
MANIPULATOR VCARE STD CRV RETR (MISCELLANEOUS) IMPLANT
NDL INSUFF 14G 150MM VS150000 (NEEDLE) IMPLANT
NDL INSUFF ACCESS 14 VERSASTEP (NEEDLE) ×3 IMPLANT
NEEDLE HYPO 22GX1.5 SAFETY (NEEDLE) ×6 IMPLANT
NEEDLE MAYO 6 CRC TAPER PT (NEEDLE) IMPLANT
NS IRRIG 1000ML POUR BTL (IV SOLUTION) ×3 IMPLANT
NS IRRIG 500ML POUR BTL (IV SOLUTION) ×3 IMPLANT
OCCLUDER COLPOPNEUMO (BALLOONS) ×3 IMPLANT
PACK BASIN MAJOR ARMC (MISCELLANEOUS) IMPLANT
PACK GYN LAPAROSCOPIC (MISCELLANEOUS) ×3 IMPLANT
PAD OB MATERNITY 4.3X12.25 (PERSONAL CARE ITEMS) ×3 IMPLANT
PAD PREP 24X41 OB/GYN DISP (PERSONAL CARE ITEMS) ×3 IMPLANT
PENCIL ELECTRO HAND CTR (MISCELLANEOUS) ×15 IMPLANT
POUCH ENDO CATCH II 15MM (MISCELLANEOUS) ×6 IMPLANT
RETRACTOR GRASP SM DA VINCI (INSTRUMENTS) ×1
RETRACTOR GRASP SM DVNC (INSTRUMENTS) ×2 IMPLANT
SCISSORS METZENBAUM CVD 33 (INSTRUMENTS) IMPLANT
SET TRI-LUMEN FLTR TB AIRSEAL (TUBING) ×3 IMPLANT
SLEEVE VERSASTEP EXPAND ONEST (MISCELLANEOUS) ×15 IMPLANT
SOLUTION ELECTROLUBE (MISCELLANEOUS) ×3 IMPLANT
SPONGE LAP 18X18 RF (DISPOSABLE) IMPLANT
STAPLER SKIN PROX 35W (STAPLE) IMPLANT
STRIP CLOSURE SKIN 1/2X4 (GAUZE/BANDAGES/DRESSINGS) IMPLANT
SUT DVC VLOC 180 0 12IN GS21 (SUTURE) ×6
SUT ETHIBOND CT1 BRD #0 30IN (SUTURE) IMPLANT
SUT MNCRL 3 0 RB1 (SUTURE) ×4 IMPLANT
SUT MNCRL 4-0 (SUTURE) ×2
SUT MNCRL 4-0 27XMFL (SUTURE) ×4
SUT MONOCRYL 3 0 RB1 (SUTURE) ×2
SUT VIC AB 0 CT1 27 (SUTURE)
SUT VIC AB 0 CT1 27XCR 8 STRN (SUTURE) IMPLANT
SUT VIC AB 1 CT1 36 (SUTURE) IMPLANT
SUT VIC AB 2-0 CT1 27 (SUTURE)
SUT VIC AB 2-0 CT1 TAPERPNT 27 (SUTURE) IMPLANT
SUT VIC AB 3-0 SH 27 (SUTURE) ×1
SUT VIC AB 3-0 SH 27X BRD (SUTURE) ×2 IMPLANT
SUT VIC AB 4-0 PS2 18 (SUTURE) ×3 IMPLANT
SUT VICRYL 0 AB UR-6 (SUTURE) ×3 IMPLANT
SUTURE DVC VLC 180 0 12IN GS21 (SUTURE) ×4 IMPLANT
SUTURE MNCRL 4-0 27XMF (SUTURE) ×4 IMPLANT
SYR 10ML LL (SYRINGE) ×6 IMPLANT
SYR 30ML LL (SYRINGE) IMPLANT
SYR 3ML LL SCALE MARK (SYRINGE) ×3 IMPLANT
SYR 50ML LL SCALE MARK (SYRINGE) ×3 IMPLANT
SYR CONTROL 10ML (SYRINGE) ×3 IMPLANT
TRAY FOLEY MTR SLVR 16FR STAT (SET/KITS/TRAYS/PACK) IMPLANT
TRAY PREP VAG/GEN (MISCELLANEOUS) IMPLANT
TROCAR BALLN GELPORT 12X130M (ENDOMECHANICALS) ×3 IMPLANT
TROCAR DISP BLADELESS 8 DVNC (TROCAR) ×2 IMPLANT
TROCAR DISP BLADELESS 8MM (TROCAR) ×1
TROCAR PORT AIRSEAL 5X120 (TROCAR) ×3 IMPLANT
TROCAR VERSASTEP 12M LG PL (TROCAR) ×3 IMPLANT
TROCAR VERSASTEP PLUS 12MM (TROCAR) ×3 IMPLANT
TUBING INSUF HEATED (TUBING) ×3 IMPLANT

## 2018-07-09 NOTE — Progress Notes (Signed)
Pt AOx4 resting in bed.  NAD.  Speech clear.  Family at bedside

## 2018-07-09 NOTE — Anesthesia Postprocedure Evaluation (Addendum)
Anesthesia Post Note  Patient: Ausha Sieh  Procedure(s) Performed: ROBOTIC ASSISTED TOTAL HYSTERECTOMY WITH BILATERAL SALPINGO OOPHORECTOMY (Bilateral ) CYSTOSCOPY (N/A ) ROBOTIC ASSISTED LAPAROSCOPIC OVARIAN CYSTECTOMY (Bilateral )  Patient location during evaluation: PACU Anesthesia Type: General Level of consciousness: awake and alert Pain management: pain level controlled Vital Signs Assessment: post-procedure vital signs reviewed and stable Respiratory status: spontaneous breathing, nonlabored ventilation, respiratory function stable and patient connected to nasal cannula oxygen Cardiovascular status: blood pressure returned to baseline and stable Postop Assessment: no apparent nausea or vomiting Comments: Patient neurologically intact but slow to emerge.  Plan to keep patient for overnight observation.       Last Vitals:  Vitals:   07/09/18 1355 07/09/18 1416  BP:    Pulse: 73 88  Resp: (!) 8 18  Temp:    SpO2: 97% 94%    Last Pain:  Vitals:   07/09/18 1416  TempSrc:   PainSc: 0-No pain                 Precious Haws Rene Sizelove

## 2018-07-09 NOTE — Anesthesia Post-op Follow-up Note (Signed)
Anesthesia QCDR form completed.        

## 2018-07-09 NOTE — Anesthesia Procedure Notes (Signed)
Procedure Name: Intubation Date/Time: 07/09/2018 8:09 AM Performed by: Nile Riggs, CRNA Pre-anesthesia Checklist: Patient identified, Emergency Drugs available, Suction available, Patient being monitored and Timeout performed Patient Re-evaluated:Patient Re-evaluated prior to induction Oxygen Delivery Method: Circle system utilized Preoxygenation: Pre-oxygenation with 100% oxygen Induction Type: IV induction Ventilation: Mask ventilation without difficulty and Oral airway inserted - appropriate to patient size Laryngoscope Size: Sabra Heck and 2 Grade View: Grade I Tube type: Oral Tube size: 7.5 mm Number of attempts: 1 Airway Equipment and Method: Stylet Placement Confirmation: ETT inserted through vocal cords under direct vision,  positive ETCO2,  CO2 detector and breath sounds checked- equal and bilateral Secured at: 20 cm Dental Injury: Teeth and Oropharynx as per pre-operative assessment

## 2018-07-09 NOTE — Anesthesia Preprocedure Evaluation (Signed)
Anesthesia Evaluation  Patient identified by MRN, date of birth, ID band Patient awake    Reviewed: Allergy & Precautions, H&P , NPO status , Patient's Chart, lab work & pertinent test results  History of Anesthesia Complications Negative for: history of anesthetic complications  Airway Mallampati: III  TM Distance: >3 FB Neck ROM: full    Dental  (+) Chipped, Poor Dentition, Missing, Partial Upper   Pulmonary neg shortness of breath, former smoker,           Cardiovascular Exercise Tolerance: Good hypertension, (-) angina(-) Past MI and (-) DOE      Neuro/Psych PSYCHIATRIC DISORDERS Anxiety Depression negative neurological ROS     GI/Hepatic Neg liver ROS, GERD  Medicated and Controlled,  Endo/Other  negative endocrine ROS  Renal/GU CRF     Musculoskeletal  (+) Arthritis ,   Abdominal   Peds  Hematology negative hematology ROS (+)   Anesthesia Other Findings Past Medical History: No date: Anxiety No date: Arthritis No date: Cough No date: Depression No date: GERD (gastroesophageal reflux disease) No date: Hyperlipidemia No date: Hypertension No date: Thyroid nodule No date: Torn meniscus     Comment:  R   Past Surgical History: 09/2015: BREAST BIOPSY; Left     Comment:  [place] core bx  NEG  No date: BREAST BIOPSY; Right     Comment:  both core and excisional done, multiples 2014: BREAST BIOPSY; Left No date: CESAREAN SECTION No date: TONSILLECTOMY  BMI    Body Mass Index:  30.66 kg/m      Reproductive/Obstetrics negative OB ROS                             Anesthesia Physical Anesthesia Plan  ASA: III  Anesthesia Plan: General ETT   Post-op Pain Management:    Induction: Intravenous  PONV Risk Score and Plan: Ondansetron, Dexamethasone and Midazolam  Airway Management Planned: Oral ETT  Additional Equipment:   Intra-op Plan:   Post-operative Plan: Extubation  in OR  Informed Consent: I have reviewed the patients History and Physical, chart, labs and discussed the procedure including the risks, benefits and alternatives for the proposed anesthesia with the patient or authorized representative who has indicated his/her understanding and acceptance.   Dental Advisory Given  Plan Discussed with: Anesthesiologist, CRNA and Surgeon  Anesthesia Plan Comments: (Patient consented for risks of anesthesia including but not limited to:  - adverse reactions to medications - damage to teeth, lips or other oral mucosa - sore throat or hoarseness - Damage to heart, brain, lungs or loss of life  Patient voiced understanding.)        Anesthesia Quick Evaluation

## 2018-07-09 NOTE — Progress Notes (Signed)
Pt AOx4, slow to answer questions.  Speech is slurry, able to follow directions.  Will continue to monitor

## 2018-07-09 NOTE — Interval H&P Note (Signed)
History and Physical Interval Note:  07/09/2018 7:49 AM  Amanda Hardin  has presented today for surgery, with the diagnosis of UTERINE FIBROIDS  The various methods of treatment have been discussed with the patient and family. After consideration of risks, benefits and other options for treatment, the patient has consented to  Procedure(s): ROBOTIC ASSISTED TOTAL HYSTERECTOMY WITH BILATERAL SALPINGO OOPHORECTOMY, POSS. LAPAROTOMY (N/A) POSSIBLE LAPAROTOMY (N/A) as a surgical intervention .  The patient's history has been reviewed, patient examined, no change in status, stable for surgery.  I have reviewed the patient's chart and labs.  Questions were answered to the patient's satisfaction.    Prentice Docker, MD, [facility], [facility] 07/09/2018 7:49 AM

## 2018-07-09 NOTE — Progress Notes (Signed)
Notified by anesthesia that patient still groggy and slow to wake from anesthesia.  Responds appropriately when prompted and does not meet criteria for stroke. She also has a small O2 requirement. Will admit the patient for overnight observation.    Prentice Docker, MD, [facility], [facility] 07/09/2018 4:05 PM

## 2018-07-09 NOTE — Discharge Instructions (Addendum)
Please call your doctor or return to the ER if you experience any chest pains, shortness of breath, dizziness, visual changes, fever greater than 101, any heavy bleeding (saturating more than 1 pad per hour), large clots, or foul smelling discharge, and/or any worsening abdominal pain and cramping that is not controlled by pain medication. No tampons, enemas, douches, or sexual intercourse for 6 weeks. Also avoid tub baths, hot tubs, or swimming for 6 weeks.         AMBULATORY SURGERY  DISCHARGE INSTRUCTIONS   1) The drugs that you were given will stay in your system until tomorrow so for the next 24 hours you should not:  A) Drive an automobile B) Make any legal decisions C) Drink any alcoholic beverage   2) You may resume regular meals tomorrow.  Today it is better to start with liquids and gradually work up to solid foods.  You may eat anything you prefer, but it is better to start with liquids, then soup and crackers, and gradually work up to solid foods.   3) Please notify your doctor immediately if you have any unusual bleeding, trouble breathing, redness and pain at the surgery site, drainage, fever, or pain not relieved by medication.    4) Additional Instructions:               - Hold blood pressure medication for 1 week.   Please contact your physician with any problems or Same Day Surgery at [phone], Monday through Friday 6 am to 4 pm, or [place] at [facility] number at [phone].

## 2018-07-09 NOTE — Op Note (Signed)
Operative Note   Date 07/08/2018 Time 12:08 PM  PRE-OP DIAGNOSIS: Symptomatic leiomyoma and bilateral ovarian cysts.   POST-OP DIAGNOSIS: Symptomatic leiomyoma (with contained manual morcellation within a laparoscopic bag) and bilateral ovarian cysts.   SURGEON: Surgeon(s) and Role: Panel 1: Prentice Docker, MD  ASSISTANT:  Angeles Gaetana Michaelis, MD.  No other capable assistant available, in surgery requiring high level assistant. Also, Dr. Theora Gianotti present due to concern for possible cancer and need for staging, which would have been done by her as she is a gynecologic oncologist.  ANESTHESIA: General ETT   PROCEDURE: Procedure(s):Robotic TLH, BSO with contained manual morcellation of leiomyoma within a laparoscopic bag and contained drainage of right ovarian cyst within laparoscopic bag   ESTIMATED BLOOD LOSS: 100 mL  DRAINS: None  TOTAL IV FLUIDS: 2,000 mL crystalloiid  SPECIMENS:  Uterus with cervix, bilateral ovaries(containing cysts)/tubes, and washings  COMPLICATIONS: None  DISPOSITION: Stable to PACU  CONDITION: Stable   INDICATIONS: Symptomatic leiomyoma and bilateral ovarian cysts.  FINDINGS: Exam under anesthesia revealed a large mobile anteverted uterus extending to the umbilicus on the right. Unable to palpate adnexal masses due to enlarged uterus. The cervix was negative for gross lesions. The vaginal introitus was very narrow. Intraoperative findings included enlarged uterus ~16 cm. The adnexa were both enlarged on the left to ~5-6 cm and on the right 8-10cm. The upper abdomen was normal including omentum, bowel, liver, stomach, and diaphragmatic surfaces. There was no evidence of grossly malignant pelvic or right para-aortic lymph nodes.   PROCEDURE IN DETAIL: After informed consent was obtained, the patient was taken to the operating room where anesthesia was obtained without difficulty. The patient was positioned in the dorsal lithotomy position in [place] and her arms were carefully tucked at her sides and the usual precautions were taken.  She was prepped and draped in normal sterile fashion.  Time-out was performed and a Foley catheter was placed into the bladder. A standard VCare uterine manipulator was then placed in the uterus without incident.    The abdomen was entered via an umbilical incision which was carried through the various layers until the peritoneal cavity was entered. The LUQ 5 mm port and 8 bilateral robotic ports were placed with care to avoid the abdominal wall vasculature. The patient was placed in Trendelenburg and the bowel was displaced up into the upper abdomen.  Cytologic washings were obtained. The robot was docked and instruments entered under direct visualization.   Round ligaments were divided on each side with the EndoShears and the retroperitoneal space was opened bilaterally.  The ureters were identified and preserved.  At this point the retroperitoneal spaces were developed and the infundibulopelvic ligaments were skeletonized, sealed and divided.  A bladder flap was created and the bladder was dissected down off the lower uterine segment and cervix using endoshears and electrocautery. The right ovary and tube were detached to allow for visualization and placed in the upper abdomen. The uterine arteries were skeletonized bilaterally, sealed and divided. On the right the vessels were divided at the level where the uterine crosses over the ureter. The enlarged uterus distorted normal anatomy. The ureter was deviated laterally and away from the where the vessels were sealed and divided. The remaining attachments were taken down and the bladder flap further developed.  A colpotomy was performed circumferentially along the V-Care ring with electrocautery and the cervix was incised from the vagina. Endocatch bags were placed in the vagina; one for the right ovary/tube and  the other for the remaining specimens. The left ovary  had to be amputated from the uterus and added the right ovarian bag. The specimens were removed through a small transverse incision, ~5cm in length. The small size of the vagina and large size of the uterus precluded a vaginal removal. A pneumo balloon was placed in the vagina.   At this point all robotic instruments were removed and the robot was undocked. The small, ~ 5 cm, transverse incision was made and carried down the abdominal wall layers until the peritoneal cavity was entered. The first bag with the uterus was brought to the level of the incision.  Cold morcellation of the uterus was performed in the bag until the remaining contents could be removed through the incision in the bag.  The second bag was then brought to the incision and the ovarian cyst was drained and the bag was removed through the incision. After ensuring hemostasis of the rectus abdominus muscles, the fascia was re-approximated using a running 0-Vicryl stitch. The subcutaneous tissue was re-approximated so that no greater than 2 cm of dead space remained. The skin was closed using 4-0 monocryl.   The robot was redocked for the vaginal cuff closure. The vaginal cuff was then closed in a running continuous fashion using the EndoStitch technique with 0 V-Lock suture with careful attention to include the vaginal cuff angles and the vaginal mucosa within the closure.  Hemostasis was observed. The intraperitoneal pressure was dropped to 5 mmHg, and all planes of dissection, vascular pedicles and the vaginal cuff were found to be hemostatic.  To ensure continued hemostasis, Fibrillar was placed on on the vaginal cuff and at the angles.    The robotic instruments were removed and the robot undocked. The trocars were removed with direct visualization and the CO2 gas was released. The umbilical incision fascia was closed with 0 Vicryl suture using a running technique. The skin incision at the umbilicus was closed with subcuticular stitch.   The remaining skin incisions were closed with 4-0 monocryl and surgical skin glue.   Given the proximity of the ureter to the vascular and tissue planes dissected, a cystoscopy was performed. The foley catheter was removed from the bladder and the 30 degree cystoscopy was gently introduced into the bladder without incident. The patient was given IV Fluorescein dye.  Efflux was noted bilaterally from the ureteral orifices.  The bladder was inspected and no defects were noted.  The bladder was drained of fluid and the cystoscope was removed.    The patient tolerated the procedure well.  Sponge, lap and needle counts were correct x2.  At the end of the case the vagina was inspected The patient was taken to recovery room in excellent condition.  Antibiotics: The patient receved 2 grams of ancef within 1 hour of skin incision.    VTE prophylaxis: SCDs were in place throughout the entire procedure.   I assisted Dr. Glennon Mac with this procedure.  Gillis Ends, MD  I was present throughout the entire case. Dr. Theora Gianotti assisted with the dissection on the right and colpotomy, as well as initially entry and port placement and docking. She also performed assessment of extrauterine disease, given her specialty as a gynecologic oncologist.   Prentice Docker, MD, [place] 07/09/2018 2:29 PM

## 2018-07-09 NOTE — Transfer of Care (Signed)
Immediate Anesthesia Transfer of Care Note  Patient: Amanda Hardin  Procedure(s) Performed: ROBOTIC ASSISTED TOTAL HYSTERECTOMY WITH BILATERAL SALPINGO OOPHORECTOMY (Bilateral ) CYSTOSCOPY (N/A ) ROBOTIC ASSISTED LAPAROSCOPIC OVARIAN CYSTECTOMY (Bilateral )  Patient Location: PACU  Anesthesia Type:General  Level of Consciousness: awake, alert , oriented and patient cooperative  Airway & Oxygen Therapy: Patient Spontanous Breathing and Patient connected to face mask oxygen  Post-op Assessment: Report given to RN, Post -op Vital signs reviewed and stable and Patient moving all extremities  Post vital signs: Reviewed and stable  Last Vitals:  Vitals Value Taken Time  BP 123/67 07/09/2018  1:19 PM  Temp 36.2 C 07/09/2018  1:19 PM  Pulse 78 07/09/2018  1:26 PM  Resp 16 07/09/2018  1:26 PM  SpO2 97 % 07/09/2018  1:26 PM  Vitals shown include unvalidated device data.  Last Pain:  Vitals:   07/09/18 1319  TempSrc:   PainSc: 0-No pain      Patients Stated Pain Goal: 0 (17/35/67 0141)  Complications: No apparent anesthesia complications

## 2018-07-10 ENCOUNTER — Encounter: Payer: Self-pay | Admitting: Obstetrics and Gynecology

## 2018-07-10 LAB — BASIC METABOLIC PANEL
ANION GAP: 8 (ref 5–15)
BUN: 16 mg/dL (ref 8–23)
CHLORIDE: 104 mmol/L (ref 98–111)
CO2: 27 mmol/L (ref 22–32)
Calcium: 8 mg/dL — ABNORMAL LOW (ref 8.9–10.3)
Creatinine, Ser: 1.14 mg/dL — ABNORMAL HIGH (ref 0.44–1.00)
GFR calc Af Amer: 58 mL/min — ABNORMAL LOW (ref >60)
GFR, EST NON AFRICAN AMERICAN: 50 mL/min — AB (ref >60)
Glucose, Bld: 110 mg/dL — ABNORMAL HIGH (ref 70–99)
POTASSIUM: 3.5 mmol/L (ref 3.5–5.1)
SODIUM: 139 mmol/L (ref 135–145)

## 2018-07-10 LAB — CBC
HEMATOCRIT: 30.7 % — AB (ref 35.0–47.0)
HEMOGLOBIN: 10.3 g/dL — AB (ref 12.0–16.0)
MCH: 28 pg (ref 26.0–34.0)
MCHC: 33.4 g/dL (ref 32.0–36.0)
MCV: 83.9 fL (ref 80.0–100.0)
Platelets: 215 10*3/uL (ref 150–440)
RBC: 3.66 MIL/uL — AB (ref 3.80–5.20)
RDW: 15 % — ABNORMAL HIGH (ref 11.5–14.5)
WBC: 9.8 10*3/uL (ref 3.6–11.0)

## 2018-07-10 LAB — CYTOLOGY - NON PAP

## 2018-07-10 LAB — SURGICAL PATHOLOGY

## 2018-07-10 NOTE — Progress Notes (Signed)
Discharge order received from doctor. Reviewed discharge instructions and prescriptions with patient and answered all questions. Incision cleaning kit given. Follow up appointment given. Patient verbalized understanding. Patient discharged home via wheelchair by nursing/auxillary.    Sami Roes Garner, RN  

## 2018-07-10 NOTE — Addendum Note (Signed)
Addendum  created 07/10/18 2591 by Piscitello, Precious Haws, MD   Sign clinical note

## 2018-07-10 NOTE — Discharge Summary (Signed)
DC Summary Discharge Summary   Patient ID: Amanda Hardin [id number] 62 y.o. November 25, 1956  Admit date: 07/09/2018  Discharge date: 07/10/2018  Principal Diagnoses:  1) fibroid uterus 2) bilateral ovarian cysts  Secondary Diagnoses:  none  Procedures performed during the hospitalization:  1) robot assisted total laparoscopic hysterectomy, bilateral salpingo-oophorectomy, removal of ovarian cysts, cystoscopy 2) postoperative care  HPI: She has been followed for several years (initially in [place]) and now for the past year or so here for the above. She is symptomatic of left lower quadrant pain.  She had a normal CA-125 (18.5) and LDH (130).    04/17/2018- MR PELVIS W WO CONTRAST 1. Dominant markedly heterogeneous fat-containing 10.0 cm transmural right uterine body mass, which has increased in size since 2018 pelvic sonogram. 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 Surgical consultation advised. 2. Large simple 8.2 cm right ovarian cyst. Mildly complex septated 4.8 cm left ovarian cyst. 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 Continued sonographic surveillance advised annually if not resected. 3. Numerous smaller typical uterine fibroids.  Historical (from records reviewed): 07/17/13:Ultrasound report largest fibroid 8 x 5.6 x 7.1 cm Simple cyst noted (laterality not specified): 7.3 cm 11/10/13:ultrasound report Fibroids: 4.6 x 5.4 cm, 1.8 x 2.0 xcm, 5.4 x 6.8 cm No cysts mentioned 11/26/2016:CT report Fibroid: 7.7 x 6.7 x 7 cm Cyst (possibly from right ovary): 7.5 x 7.5 x 7.7 cm 01/25/2017: Ultrasound  Uterus:12.0 x 8.3 x 8.6 cm; several fibroids, largest is 8.0 x 7.1 x 6.3 cm. Several other 2-3cm fibroids mentioned in  report. Adnexa:cyst seen Right ovary: 8.4 x 8.7 x 8.4 cm simple appearing cyst Left ovary: 4.3 x 2.9 x 3.8 cm simple appearing cyst 05/27/17: Ultrasound  Uterus:12.5 x 7.9 x 9.2 cm. Multiple uterine fibroids, including a dominant 7.4 x 8.2 x 5.7 cm intramural fibroid along the right posterior uterine body, previously 8.0 cm, grossly unchanged. Additional smaller uterine fibroids measuring up to 4.7 cm in the left anterior uterine body. Adnexa:cyst seen Right ovary: ?7.7 x 7.6 x 7.5 cm simple appearing cyst - decreased Left ovary: 4.4 x 4.2 x 3.9 cm simple appearing cyst - stable No abnormal free fluid. 01/14/17: Ultrasound  Uterus:11.9 x 5.4 x 10.1 cm. Two fibroids including a dominant 10.5 cm right fundal fibroid. Anterior left fundal 2.4 cm fibroid. The dominant fibroid has enlarged a slightly since prior study when this measured up to 8.2 cm. Adnexa:cyst seen Right ovary: 9.0 x 8.1 x 9.3 cm simple appearing cyst - increased Left ovary: 4.0 x 3.6 x 3.2 cm simple appearing cyst - stable No abnormal free fluid.  01/08/18-  Pap- Negative for intraepithelial lesion or malignancy HPV Aptima- positive. Negative for HPV genotype 16, 18, and 45.     Past Medical History:  Diagnosis Date  . Anxiety   . Arthritis   . Cough   . Depression   . GERD (gastroesophageal reflux disease)   . Hyperlipidemia   . Hypertension   . Thyroid nodule   . Torn meniscus    R     Past Surgical History:  Procedure Laterality Date  . BREAST BIOPSY Left 09/2015   [place] core bx  NEG   . BREAST BIOPSY Right    both core and excisional done, multiples  . BREAST BIOPSY Left 2014  . CESAREAN SECTION    . CYSTOSCOPY N/A 07/09/2018  Procedure: CYSTOSCOPY;  Surgeon: Will Bonnet, MD;  Location: [facility];  Service: Gynecology;  Laterality: N/A;  . ROBOTIC ASSISTED LAPAROSCOPIC OVARIAN CYSTECTOMY Bilateral 07/09/2018    Procedure: ROBOTIC ASSISTED LAPAROSCOPIC OVARIAN CYSTECTOMY;  Surgeon: Will Bonnet, MD;  Location: [facility];  Service: Gynecology;  Laterality: Bilateral;  . ROBOTIC ASSISTED TOTAL HYSTERECTOMY WITH BILATERAL SALPINGO OOPHERECTOMY Bilateral 07/09/2018   Procedure: ROBOTIC ASSISTED TOTAL HYSTERECTOMY WITH BILATERAL SALPINGO OOPHORECTOMY;  Surgeon: Will Bonnet, MD;  Location: [facility];  Service: Gynecology;  Laterality: Bilateral;  . TONSILLECTOMY      Allergies  Allergen Reactions  . Norco [Hydrocodone-Acetaminophen] Nausea And Vomiting  . Penicillin G     Childhood allergy Has patient had a PCN reaction causing immediate rash, facial/tongue/throat swelling, SOB or lightheadedness with hypotension: Unknown Has patient had a PCN reaction causing severe rash involving mucus membranes or skin necrosis: Unknown Has patient had a PCN reaction that required hospitalization: Unknown Has patient had a PCN reaction occurring within the last 10 years: No If all of the above answers are "NO", then may proceed with Cephalosporin use.     Social History   Tobacco Use  . Smoking status: Former Smoker    Years: 25.00    Types: Cigarettes    Last attempt to quit: 03/19/2002    Years since quitting: 16.3  . Smokeless tobacco: Never Used  Substance Use Topics  . Alcohol use: No  . Drug use: No    Family History  Problem Relation Age of Onset  . Diabetes Maternal Aunt   . Hypertension Maternal Aunt   . Prostate cancer Father   . Heart failure Father   . Pancreatic cancer Paternal Aunt   . Breast cancer Neg Hx   . Bladder Cancer Neg Hx   . Kidney cancer Neg Hx     Hospital Course:  The patient was admitted on 07/09/18 for the above surgery, which occurred without incident.  She was scheduled to be discharged to home. However, she had an extended period of time of lethargy postoperatively.  The decision was made to monitor the patient overnight.  She was unable to void 8 hours  post-operatively. So, a foley catheter was re-inserted in her bladder on POD#0.  On POD#1, she had an appropriate drop in her lab values and her CMP was normal. She had a formal voiding trial where 180 mL of sterile saline was instilled into her bladder. One hour later she voided 300 mL.  She was ambulating, tolerating PO, and her pain was well controlled on PO pain medication.  She was also voiding. Her blood pressures were initially "soft" for her. However, they were stable and began to increase. She was asymptomatic on ambulating. She was, therefore, deemed to be appropriate for discharge.   Discharge Exam: BP (!) 117/45 (BP Location: Right Arm) Comment: nurse Justus Memory notified  Pulse 75   Temp 98.8 F (37.1 C) (Oral)   Resp 18   Ht 5' (1.524 m)   Wt 157 lb (71.2 kg)   SpO2 93%   BMI 30.66 kg/m  Physical Exam  Constitutional: She is oriented to person, place, and time. She appears well-developed and well-nourished. No distress.  HENT:  Head: Normocephalic and atraumatic.  Eyes: Conjunctivae are normal. No scleral icterus.  Cardiovascular: Normal rate and regular rhythm.  No murmur heard. Pulmonary/Chest: Effort normal and breath sounds normal. No stridor. No respiratory distress. She has no wheezes. She has no rales.  Abdominal: Soft. Bowel  sounds are normal. She exhibits no distension and no mass. There is tenderness (appropriately ttp). There is no guarding.  All incision sites: clean, dry, and intact  Musculoskeletal: Normal range of motion. She exhibits no edema.  Neurological: She is alert and oriented to person, place, and time. No cranial nerve deficit.  Skin: Skin is warm and dry. No erythema.  Psychiatric: She has a normal mood and affect. Her behavior is normal. Judgment normal.     Condition at Discharge: Stable  Complications affecting treatment: None  Discharge Medications:  Allergies as of 07/10/2018      Reactions   Norco [hydrocodone-acetaminophen] Nausea And  Vomiting   Penicillin G    Childhood allergy Has patient had a PCN reaction causing immediate rash, facial/tongue/throat swelling, SOB or lightheadedness with hypotension: Unknown Has patient had a PCN reaction causing severe rash involving mucus membranes or skin necrosis: Unknown Has patient had a PCN reaction that required hospitalization: Unknown Has patient had a PCN reaction occurring within the last 10 years: No If all of the above answers are "NO", then may proceed with Cephalosporin use.      Medication List    STOP taking these medications   meloxicam 15 MG tablet Commonly known as:  MOBIC     TAKE these medications   chlorthalidone 25 MG tablet Commonly known as:  HYGROTON Take 25 mg by mouth once daily   enalapril 10 MG tablet Commonly known as:  VASOTEC Take 10 mg by mouth daily.   ibuprofen 600 MG tablet Commonly known as:  ADVIL,MOTRIN Take 1 tablet (600 mg total) by mouth every 6 (six) hours as needed for mild pain or cramping.   meclizine 25 MG tablet Commonly known as:  ANTIVERT Take 25 mg by mouth twice daily as needed for dizziness   ondansetron 8 MG disintegrating tablet Commonly known as:  ZOFRAN ODT Take 1 tablet (8 mg total) by mouth every 8 (eight) hours as needed for nausea or vomiting.   oxyCODONE-acetaminophen 5-325 MG tablet Commonly known as:  PERCOCET/ROXICET Take 1 tablet by mouth every 6 (six) hours as needed (breakthrough pain).   pantoprazole 40 MG tablet Commonly known as:  PROTONIX Take 40 mg by mouth daily.   pravastatin 40 MG tablet Commonly known as:  PRAVACHOL Take 40 mg by mouth daily.   pregabalin 150 MG capsule Commonly known as:  LYRICA Take 150 mg by mouth 3 (three) times daily.   sertraline 100 MG tablet Commonly known as:  ZOLOFT Take 100 mg by mouth daily.   SYSTANE OP Place 1 drop into both eyes daily as needed (dry eyes).   traMADol 50 MG tablet Commonly known as:  ULTRAM Take 1 tablet (50 mg total) by  mouth every 6 (six) hours as needed.   valACYclovir 500 MG tablet Commonly known as:  VALTREX Take 500 mg by mouth daily.      Follow-up arrangements:  Follow-up Information    Will Bonnet, MD On 07/21/2018.   Specialty:  Obstetrics and Gynecology Why:  Keep previously scheduled appt with Dr. Glennon Mac for post op Contact information: [address] [place] [address] [phone]          Discharge Disposition: Home to self care  Signed: Prentice Docker, MD 07/10/2018 1:10 PM

## 2018-07-21 ENCOUNTER — Encounter: Payer: Self-pay | Admitting: Obstetrics and Gynecology

## 2018-07-21 ENCOUNTER — Ambulatory Visit (INDEPENDENT_AMBULATORY_CARE_PROVIDER_SITE_OTHER): Payer: BLUE CROSS/BLUE SHIELD | Admitting: Obstetrics and Gynecology

## 2018-07-21 VITALS — BP 132/70 | HR 70 | Ht 60.0 in | Wt 152.5 lb

## 2018-07-21 DIAGNOSIS — D250 Submucous leiomyoma of uterus: Secondary | ICD-10-CM

## 2018-07-21 DIAGNOSIS — Z09 Encounter for follow-up examination after completed treatment for conditions other than malignant neoplasm: Secondary | ICD-10-CM

## 2018-07-21 DIAGNOSIS — N83201 Unspecified ovarian cyst, right side: Secondary | ICD-10-CM

## 2018-07-21 DIAGNOSIS — D251 Intramural leiomyoma of uterus: Secondary | ICD-10-CM

## 2018-07-21 DIAGNOSIS — N83202 Unspecified ovarian cyst, left side: Secondary | ICD-10-CM

## 2018-07-21 DIAGNOSIS — D252 Subserosal leiomyoma of uterus: Secondary | ICD-10-CM

## 2018-07-21 NOTE — Progress Notes (Signed)
   Postoperative Follow-up Patient presents post op from robotic total laparoscopic hysterectomy, bilateral salpingo-oophorectomy, with contained manual morcellation of leiomyoma with a laparoscopic bag and contained drainage of right ovarian cyst within laparoscopic bag 12 days ago for adnexal mass and fibroids.  Subjective: Patient reports marked improvement in her preop symptoms. Eating a regular diet without difficulty. Pain is controlled with current analgesics. Medications being used: ibuprofen (OTC).  Activity: increasing slowly. Still with some energy deficits.  Can not sit upright for more than a couple of hours without needing to lie down.  Denies fevers, chills, nausea, emesis. Is voiding, ambulating.  .  Objective: Vitals:   07/21/18 1341  BP: 132/70  Pulse: 70  SpO2: 97%   Vital Signs: BP 132/70 (BP Location: Left Arm, Patient Position: Sitting, Cuff Size: Normal)   Pulse 70   Ht 5' (1.524 m)   Wt 152 lb 8 oz (69.2 kg)   SpO2 97%   BMI 29.78 kg/m  Constitutional: Well nourished, well developed female in no acute distress.  HEENT: normal Skin: Warm and dry.  Extremity: no edema  Abdomen: Soft, non-tender, normal bowel sounds; no bruits, organomegaly or masses. clean, dry and intact   Assessment: 62 y.o. s/p the above surgery progressing well  Plan: Patient has done well after surgery with no apparent complications.  I have discussed the post-operative course to date, and the expected progress moving forward.  The patient understands what complications to be concerned about.  I will see the patient in routine follow up, or sooner if needed.    Activity plan: increase activity slowly.  Pathology benign.  Reviewed with patient. Per Dr. Theora Gianotti, no need for gyn onc follow up.   Prentice Docker, MD 07/21/2018, 1:50 PM

## 2018-08-20 ENCOUNTER — Ambulatory Visit (INDEPENDENT_AMBULATORY_CARE_PROVIDER_SITE_OTHER): Payer: Self-pay | Admitting: Obstetrics and Gynecology

## 2018-08-20 ENCOUNTER — Encounter: Payer: Self-pay | Admitting: Obstetrics and Gynecology

## 2018-08-20 VITALS — BP 122/78 | Ht 60.0 in | Wt 154.0 lb

## 2018-08-20 DIAGNOSIS — N3001 Acute cystitis with hematuria: Secondary | ICD-10-CM

## 2018-08-20 DIAGNOSIS — Z90710 Acquired absence of both cervix and uterus: Secondary | ICD-10-CM

## 2018-08-20 MED ORDER — CEPHALEXIN 500 MG PO CAPS: 500.0000 mg | ORAL_CAPSULE | Freq: Four times a day (QID) | ORAL | 0 refills | Status: DC

## 2018-08-20 NOTE — Progress Notes (Signed)
   Postoperative Follow-up Patient presents post op from robotic total laparoscopic hysterectomy, bilateral salpingo-oophorectomy, with contained morcellation of leiomyoma with laparoscopic bag and contained drainage of right ovarian cyst within laparoscopic bag 6 weeks ago for adnexal mass and fibroids.  Subjective: Patient reports marked improvement in her preop symptoms. Eating a regular diet without difficulty. The patient is not having any pain.  Activity: normal activities of daily living.  She has been having urinary frequency for about the past week. She also notes pain in her suprapubic area after voiding. She occasionally notes some blood on the napkin when she wipes after voiding. She denies fevers and chills. She denies frank red blood from her vagina.   Objective: Vitals:   08/20/18 1346  BP: 122/78   Vital Signs: BP 122/78   Ht 5' (1.524 m)   Wt 154 lb (69.9 kg)   BMI 30.08 kg/m  Constitutional: Well nourished, well developed female in no acute distress.  HEENT: normal Skin: Warm and dry.  Extremity: no edema  Abdomen: Soft, non-tender, normal bowel sounds; no bruits, organomegaly or masses. clean, dry, intact and no erythema, induration, warmth, and tenderness  Pelvic exam: (female chaperone present) is not limited by body habitus EGBUS: within normal limits Vagina: normally rugated, given age.  Vaginal cuff is intact. There is a pink-tinged white discharge in the vagina.   The suture line is intact without evidence of granulation tissue. On bimanual exam there is no tenderness or induration. The vaginal cuff has no palpable defects.    Assessment: 62 y.o. s/p robotic total laparoscopic hysterectomy, bilateral salpingo-oophorectomy, with contained morcellation of leiomyoma with laparoscopic bag and contained drainage of right ovarian cyst within laparoscopic bag progressing well overall from a surgical standpoint. She does appear to have acute cystitis, which i will  treat empirically as she is unable to void.   Plan: Patient has done well after surgery with no apparent complications.  I have discussed the post-operative course to date, and the expected progress moving forward.  The patient understands what complications to be concerned about.  I will see the patient in routine follow up, or sooner if needed.    Activity plan: May return to work in 3 weeks  Acute cystitis: keflex 500mg  po qid x 7 days.   The patient is to follow up if she has continued pink-tinged discharge 1 week after completing the abx or if she develops worsening vaginal bleeding.  Prentice Docker, MD 08/20/2018, 2:25 PM

## 2018-08-21 ENCOUNTER — Telehealth: Payer: Self-pay

## 2018-08-21 NOTE — Telephone Encounter (Signed)
Manuela Schwartz, pharmacist at [facility] in [place], calling.  Pt was rx'd Cephalixin 500mg ; is allergic to PCN; chance of cross reaction.  Calling to see if something else can be called in.  [phone]

## 2018-09-22 ENCOUNTER — Ambulatory Visit: Payer: BLUE CROSS/BLUE SHIELD | Admitting: Urology

## 2018-09-22 ENCOUNTER — Encounter: Payer: Self-pay | Admitting: Urology

## 2019-01-27 ENCOUNTER — Other Ambulatory Visit: Payer: Self-pay | Admitting: Internal Medicine

## 2019-01-27 DIAGNOSIS — Z1231 Encounter for screening mammogram for malignant neoplasm of breast: Secondary | ICD-10-CM

## 2019-02-10 ENCOUNTER — Ambulatory Visit
Admission: RE | Admit: 2019-02-10 | Discharge: 2019-02-10 | Disposition: A | Discharge status: Home / Self Care | Payer: BLUE CROSS/BLUE SHIELD | Source: Ambulatory Visit | Attending: Internal Medicine | Admitting: Internal Medicine

## 2019-02-10 DIAGNOSIS — Z1231 Encounter for screening mammogram for malignant neoplasm of breast: Secondary | ICD-10-CM | POA: Insufficient documentation

## 2019-02-11 ENCOUNTER — Other Ambulatory Visit: Payer: Self-pay | Admitting: Obstetrics and Gynecology

## 2019-02-11 DIAGNOSIS — N6310 Unspecified lump in the right breast, unspecified quadrant: Secondary | ICD-10-CM

## 2019-02-23 ENCOUNTER — Other Ambulatory Visit: Payer: BLUE CROSS/BLUE SHIELD

## 2019-05-29 ENCOUNTER — Other Ambulatory Visit: Payer: Self-pay

## 2019-05-29 ENCOUNTER — Ambulatory Visit
Admission: RE | Admit: 2019-05-29 | Discharge: 2019-05-29 | Disposition: A | Discharge status: Home / Self Care | Payer: Medicare Other | Source: Ambulatory Visit | Attending: Obstetrics and Gynecology | Admitting: Obstetrics and Gynecology

## 2019-05-29 DIAGNOSIS — N6310 Unspecified lump in the right breast, unspecified quadrant: Secondary | ICD-10-CM | POA: Insufficient documentation

## 2019-08-19 ENCOUNTER — Other Ambulatory Visit: Payer: Self-pay | Admitting: Sports Medicine

## 2019-08-19 DIAGNOSIS — M4306 Spondylolysis, lumbar region: Secondary | ICD-10-CM

## 2019-08-19 DIAGNOSIS — G8929 Other chronic pain: Secondary | ICD-10-CM

## 2019-08-30 ENCOUNTER — Ambulatory Visit: Payer: Medicare Other

## 2019-08-31 ENCOUNTER — Ambulatory Visit
Admission: RE | Admit: 2019-08-31 | Discharge: 2019-08-31 | Disposition: A | Discharge status: Home / Self Care | Payer: Medicare Other | Source: Ambulatory Visit | Attending: Sports Medicine | Admitting: Sports Medicine

## 2019-08-31 ENCOUNTER — Other Ambulatory Visit: Payer: Self-pay

## 2019-08-31 DIAGNOSIS — G8929 Other chronic pain: Secondary | ICD-10-CM | POA: Diagnosis present

## 2019-08-31 DIAGNOSIS — M5441 Lumbago with sciatica, right side: Secondary | ICD-10-CM | POA: Diagnosis not present

## 2019-08-31 DIAGNOSIS — M4306 Spondylolysis, lumbar region: Secondary | ICD-10-CM | POA: Diagnosis present

## 2019-11-17 ENCOUNTER — Telehealth: Payer: Self-pay | Admitting: Student in an Organized Health Care Education/Training Program

## 2019-11-17 NOTE — Telephone Encounter (Signed)
thanks

## 2019-11-17 NOTE — Telephone Encounter (Signed)
NP returning call for appt Scheduled 11-18-19

## 2019-11-18 ENCOUNTER — Ambulatory Visit
Payer: Medicare Other | Attending: Student in an Organized Health Care Education/Training Program | Admitting: Student in an Organized Health Care Education/Training Program

## 2019-11-18 ENCOUNTER — Telehealth: Payer: Self-pay | Admitting: *Deleted

## 2019-11-18 ENCOUNTER — Encounter: Payer: Self-pay | Admitting: Student in an Organized Health Care Education/Training Program

## 2019-11-18 ENCOUNTER — Other Ambulatory Visit: Payer: Self-pay

## 2019-11-18 DIAGNOSIS — G8929 Other chronic pain: Secondary | ICD-10-CM

## 2019-11-18 DIAGNOSIS — M5416 Radiculopathy, lumbar region: Secondary | ICD-10-CM | POA: Diagnosis not present

## 2019-11-18 DIAGNOSIS — M5136 Other intervertebral disc degeneration, lumbar region: Secondary | ICD-10-CM

## 2019-11-18 DIAGNOSIS — M47816 Spondylosis without myelopathy or radiculopathy, lumbar region: Secondary | ICD-10-CM

## 2019-11-18 NOTE — Progress Notes (Signed)
Patient's Name: Amanda Hardin  MRN: [id number]  Referring Provider: Diamond Nickel, DO  DOB: [birth year]  PCP: Derinda Late, MD  DOS: 11/18/2019  Note by: Gillis Santa, MD  Service setting: Ambulatory outpatient  Specialty: Interventional Pain Management  Location: [facility] Virtual Visit  Visit type: Initial Patient Evaluation  Patient type: New Patient   Pain Management Virtual Encounter Note - Virtual Visit via [place] (real-time audio visits between healthcare provider and patient).   Patient's Phone No.:  [phone] (home); [phone] (mobile); (Preferred) [phone] [email or web address] [facility] [id number], [place] AT [place] [address] [place] Phone: [phone] Fax: [place], [place] [address] [place] Phone: [phone] Fax: [phone]    Pre-screening note:  Our staff contacted Amanda Hardin and offered her an "in person", "face-to-face" appointment versus a telephone encounter. She indicated preferring the telephone encounter, at this time.  Primary Reason(s) for Visit: Tele-Encounter for initial evaluation of one or more chronic problems (new to examiner) potentially causing chronic pain, and posing a threat to normal musculoskeletal function. (Level of risk: High) CC: Hip Pain (right hip and buttocks), Back Pain (lumbar bilateral ), Leg Pain (left thigh neuropathy ), and Knee Pain (right, torn meniscus)  I contacted Amanda Hardin on 11/18/2019 via video conference.      I clearly identified myself as 25/01/2019, MD. I verified that I was speaking with the correct person using two identifiers (Name: Amanda Hardin, and date of birth: 07-May-1956).  Advanced Informed Consent I sought verbal advanced consent from 05/26/1956 for virtual visit interactions.  I informed Amanda Hardin of possible security and privacy concerns, risks, and limitations associated with providing "not-in-person" medical evaluation and management services. I also informed Amanda Hardin of the availability of "in-person" appointments. Finally, I informed her that there would be a charge for the virtual visit and that she could be  personally, fully or partially, financially responsible for it. Amanda Hardin expressed understanding and agreed to proceed.   HPI  Amanda Hardin is a 63 y.o. year old, female patient, contacted today for an initial evaluation of her chronic pain. She has Uterine leiomyoma; Bilateral ovarian cysts; Intramural leiomyoma of uterus; Status post laparoscopic hysterectomy; Chronic fatigue; Meralgia paraesthetica, left; Neuropathy; Trigeminal neuralgia; Lumbar radiculopathy; Chronic radicular lumbar pain; Lumbar facet arthropathy; Lumbar degenerative disc disease; and Lumbar spondylosis on their problem list.  Pain Assessment: Location: Right Hip(see visit info for additional pain sites.) Radiating: right hip pain which she refers to as bursitis that radiates into her buttocks and down her right leg Onset: More than a month ago Duration: Chronic pain Quality: Constant, Discomfort, Sharp, Tingling Severity: 5 /10 (subjective, self-reported pain score)  Effect on ADL: standing to do anything creates the pain. Timing: Constant Modifying factors: stretching, medications  Onset and Duration: Gradual, Date of onset: greater than 5 years and Present longer than 3 months Cause of pain: Unknown Severity: Getting worse, NAS-11 at its worse: 10/10, NAS-11 at its best: 4/10, NAS-11 now: 4/10 and NAS-11 on the average: 5/10 Timing: During activity or exercise and After activity or exercise Aggravating Factors: Bending and Prolonged standing Alleviating Factors: Stretching and Medications Associated Problems: Spasms, Tingling and Pain that does not allow patient to sleep Quality of  Pain: Annoying, Constant, Getting longer, Sharp, Tingling and Uncomfortable Previous Examinations or  Tests: MRI scan, X-rays and Orthopedic evaluation Previous Treatments: Epidural steroid injections and Physical Therapy  Patient is a pleasant 63 year old female who has been referred by Dr. Thomasene Lot as a fast-track for a lumbar epidural steroid injection.  In brief, patient endorses low back, bilateral hip, right greater than left pain which radiates down into bilateral buttocks and into her right posterior lateral leg.  She has performed physical therapy and is currently engaging in it.  She is on Cymbalta 60 mg daily and Lyrica 150 mg 3 times daily for her pain.  Lumbar MRI results as below.  Denies any bladder or bowel dysfunction.  Meds   Current Outpatient Medications:  .  chlorthalidone (HYGROTON) 25 MG tablet, Take 25 mg by mouth once daily, Disp: , Rfl: 3 .  DULoxetine (CYMBALTA) 60 MG capsule, Take 60 mg by mouth daily., Disp: , Rfl:  .  enalapril (VASOTEC) 10 MG tablet, Take 10 mg by mouth daily. , Disp: , Rfl:  .  meclizine (ANTIVERT) 25 MG tablet, Take 25 mg by mouth twice daily as needed for dizziness, Disp: , Rfl:  .  pantoprazole (PROTONIX) 40 MG tablet, Take 40 mg by mouth daily., Disp: , Rfl:  .  pravastatin (PRAVACHOL) 40 MG tablet, Take 40 mg by mouth daily. , Disp: , Rfl:  .  pregabalin (LYRICA) 150 MG capsule, Take 150 mg by mouth 3 (three) times daily. , Disp: , Rfl:  .  valACYclovir (VALTREX) 500 MG tablet, Take 500 mg by mouth daily. , Disp: , Rfl:  .  Polyethyl Glycol-Propyl Glycol (SYSTANE OP), Place 1 drop into both eyes daily as needed (dry eyes)., Disp: , Rfl:  .  sertraline (ZOLOFT) 100 MG tablet, Take 100 mg by mouth daily. , Disp: , Rfl: 3 .  traMADol (ULTRAM) 50 MG tablet, Take 1 tablet (50 mg total) by mouth every 6 (six) hours as needed. (Patient not taking: Reported on 07/21/2018), Disp: 20 tablet, Rfl: 0  ROS  Cardiovascular: High blood pressure Pulmonary or  Respiratory: No reported pulmonary signs or symptoms such as wheezing and difficulty taking a deep full breath (Asthma), difficulty blowing air out (Emphysema), coughing up mucus (Bronchitis), persistent dry cough, or temporary stoppage of breathing during sleep Neurological: No reported neurological signs or symptoms such as seizures, abnormal skin sensations, urinary and/or fecal incontinence, being born with an abnormal open spine and/or a tethered spinal cord Review of Past Neurological Studies:  Results for orders placed or performed during the hospital encounter of 06/04/17  MR BRAIN W WO CONTRAST   Narrative   CLINICAL DATA:  Gait imbalance and occasional falls since 2015. LEFT retroauricular pain/trigeminal neuralgia. Assess for pituitary tumor.  EXAM: MRI HEAD WITHOUT AND WITH CONTRAST  TECHNIQUE: Multiplanar, multiecho pulse sequences of the brain and surrounding structures were obtained without and with intravenous contrast.  CONTRAST:  64mL MULTIHANCE GADOBENATE DIMEGLUMINE 529 MG/ML IV SOLN  COMPARISON:  None.  FINDINGS: SELLA: No abnormal sellar expansion. Normal posterior pituitary bright spot. 5 mm ovoid focus of early hypoenhancement RIGHT adenohypophysis, with homogeneous enhancement on delayed phase. Pituitary stalk is midline with normal enhancement. Symmetric appearance of the cavernous sinuses and cavernous sinus flow voids. Normal appearance of the overlying optic chiasm.  INTRACRANIAL CONTENTS: No reduced diffusion to suggest acute ischemia or hypercellular tumor. The ventricles and sulci are normal for patient's age. Patchy supratentorial pontine white matter FLAIR T2 hyperintensities. No suspicious parenchymal signal, mass lesions, mass effect. No abnormal intraparenchymal or extra-axial enhancement. No abnormal  extra-axial fluid collections.  VASCULAR: Normal major intracranial vascular flow voids present at skull base.  ORBITS: The ocular globes and  orbital contents are non-suspicious.  SINUSES: Bilateral maxillary mucosal retention cysts. Hypoplastic frontal sinusitis. Mastoid air cells are well aerated.  SKULL/SOFT TISSUES: No suspicious calvarial bone marrow signal. Craniocervical junction maintained.  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 No specific findings to explain LEFT trigeminal neuralgia though, dedicated trigeminal neuralgia MR protocol could be performed if symptoms persist.  Moderate chronic small vessel ischemic disease.   Electronically Signed   By: Elon Alas M.D.   On: 06/04/2017 14:10    Psychological-Psychiatric: Anxiousness and Depressed Gastrointestinal: Reflux or heatburn Genitourinary: No reported renal or genitourinary signs or symptoms such as difficulty voiding or producing urine, peeing blood, non-functioning kidney, kidney stones, difficulty emptying the bladder, difficulty controlling the flow of urine, or chronic kidney disease Hematological: No reported hematological signs or symptoms such as prolonged bleeding, low or poor functioning platelets, bruising or bleeding easily, hereditary bleeding problems, low energy levels due to low hemoglobin or being anemic Endocrine: Slow thyroid Rheumatologic: Joint aches and or swelling due to excess weight (Osteoarthritis) Musculoskeletal: Negative for myasthenia gravis, muscular dystrophy, multiple sclerosis or malignant hyperthermia Work History: Quit going to work on his/her own  Allergies  Ms. Gibney is allergic to norco [hydrocodone-acetaminophen] and penicillin g.  Laboratory Chemistry Profile    Renal Lab Results  Component Value Date   BUN 16 07/10/2018   CREATININE 1.14 (H) 07/10/2018   GFRAA 58 (L) 07/10/2018   GFRNONAA 50 (L) 07/10/2018                             Hepatic Lab Results  Component Value Date   AST 32 07/09/2018   ALT 12 07/09/2018   ALBUMIN 3.3 (L) 07/09/2018   ALKPHOS 51 07/09/2018                         Electrolytes Lab Results  Component Value Date   NA 139 07/10/2018   K 3.5 07/10/2018   CL 104 07/10/2018   CALCIUM 8.0 (L) 07/10/2018                       Coagulation Lab Results  Component Value Date   PLT 215 07/10/2018                        Cardiovascular Lab Results  Component Value Date   HGB 10.3 (L) 07/10/2018   HCT 30.7 (L) 07/10/2018                         Note: Lab results reviewed.  Imaging Review     Lumbosacral Imaging: Lumbar MR wo contrast:  Results for orders placed during the hospital encounter of 08/31/19  MR LUMBAR SPINE WO CONTRAST   Narrative CLINICAL DATA:  Chronic lower back pain  EXAM: MRI LUMBAR SPINE WITHOUT CONTRAST  TECHNIQUE: Multiplanar, multisequence MR imaging of the lumbar spine was performed. No intravenous contrast was administered.  COMPARISON:  None.  FINDINGS: Segmentation: There are 5 non-rib bearing lumbar type vertebral bodies with the last intervertebral disc space labeled as L5-S1.  Alignment:  Normal  Vertebrae: The vertebral body heights are well maintained. No fracture, marrow edema,or pathologic marrow infiltration.  Conus medullaris and cauda equina: Conus extends to  the L1 level. Conus and cauda equina appear normal.  Paraspinal and other soft tissues: The paraspinal soft tissues and visualized retroperitoneal structures are unremarkable. The sacroiliac joints are intact. There is partially visualized T2 bright cystic lesion seen within the left kidney. There is also a T2 bright lesion seen within the posterior right liver lobe.  Disc levels:  T12-L1:  No significant canal or neural foraminal narrowing.  L1-L2: There is a broad-based disc bulge with ligamentum flavum hypertrophy which causes mild bilateral neural foraminal narrowing.  L2-L3: There is a broad-based disc bulge with ligamentum flavum hypertrophy which causes mild bilateral foraminal narrowing.  L3-L4: There is a  broad-based disc bulge with ligamentum flavum hypertrophy and facet arthrosis which causes mild-to-moderate bilateral neural foraminal narrowing.  L4-L5: There is a broad-based disc bulge with ligamentum flavum hypertrophy and facet arthrosis which causes moderate bilateral neural foraminal narrowing.  L5-S1: There is a broad-based disc bulge with facet arthrosis which causes moderate bilateral neural foraminal narrowing, left greater than right.  IMPRESSION: Lumbar spine spondylosis most notable at L4-L5 and L5-S1 with moderate bilateral neural foraminal narrowing.   Electronically Signed   By: Prudencio Pair M.D.   On: 08/31/2019 17:27     Results for orders placed during the hospital encounter of 09/10/17  MR KNEE RIGHT WO CONTRAST   Narrative CLINICAL DATA:  Right knee pain, swelling, limping. Unable to stand.  EXAM: MRI OF THE RIGHT KNEE WITHOUT CONTRAST  TECHNIQUE: Multiplanar, multisequence MR imaging of the knee was performed. No intravenous contrast was administered.  COMPARISON:  None.  FINDINGS: MENISCI  Medial meniscus: Radial tear of the posterior horn of the medial meniscus at the meniscal root with peripheral meniscal extrusion.  Lateral meniscus:  Intact.  LIGAMENTS  Cruciates:  Intact ACL and PCL.  Collaterals: Medial collateral ligament is intact. Lateral collateral ligament complex is intact.  CARTILAGE  Patellofemoral: Full-thickness cartilage loss of the medial patellar facet and patellar apex with subchondral reactive marrow changes at the patellar apex. Partial-thickness cartilage loss of the lateral patellar facet. Partial-thickness cartilage loss of the medial trochlea.  Medial: High-grade partial-thickness cartilage loss with areas of full-thickness cartilage loss of the medial femoral condyle and medial tibial plateau.  Lateral: Partial-thickness cartilage loss of the medial aspect of the lateral tibial plateau.  Joint: Large  joint effusion. Mild edema in Hoffa's fat. No plical thickening.  Popliteal Fossa:  No Baker cyst. Intact popliteus tendon.  Extensor Mechanism: Intact quadriceps tendon. Mild tendinosis of the patellar tendon insertion. Small amount of fluid in the deep infrapatellar bursa. Intact medial and lateral patellar retinaculum. Intact MPFL.  Bones:  No acute osseous abnormality.  No fracture or dislocation.  Other: No fluid collection or hematoma.  IMPRESSION: 1. Radial tear of the posterior horn of the medial meniscus at the meniscal root with peripheral meniscal extrusion. 2. Tricompartmental cartilage abnormalities as described above. 3. Large joint effusion. 4. Mild tendinosis of the patellar tendon insertion.   Electronically Signed   By: Kathreen Devoid   On: 09/10/2017 09:49     Complexity Note: Imaging results reviewed. Results shared with Ms. Dauphinais, using Layman's terms.                         [place]  Drug: Ms. Signs  reports no history of drug use. Alcohol:  reports no history of alcohol use. Tobacco:  reports that she quit smoking about 17 years ago. Her smoking use included cigarettes.  She quit after 25.00 years of use. She has never used smokeless tobacco. Medical:  has a past medical history of Anxiety, Arthritis, Cough, Depression, GERD (gastroesophageal reflux disease), Hyperlipidemia, Hypertension, Thyroid nodule, and Torn meniscus. Family: family history includes Diabetes in her maternal aunt; Heart failure in her father; Hypertension in her maternal aunt; Pancreatic cancer in her paternal aunt; Prostate cancer in her father.  Past Surgical History:  Procedure Laterality Date  . BREAST BIOPSY Left 09/2015   [place] core bx  NEG   . BREAST BIOPSY Right    both core and excisional done, multiples for fibroadenomas  . BREAST EXCISIONAL BIOPSY Left 2014   fibroadenomas  . CESAREAN SECTION    . CYSTOSCOPY N/A 07/09/2018   Procedure: CYSTOSCOPY;  Surgeon: Will Bonnet,  MD;  Location: [facility];  Service: Gynecology;  Laterality: N/A;  . ROBOTIC ASSISTED LAPAROSCOPIC OVARIAN CYSTECTOMY Bilateral 07/09/2018   Procedure: ROBOTIC ASSISTED LAPAROSCOPIC OVARIAN CYSTECTOMY;  Surgeon: Will Bonnet, MD;  Location: [facility];  Service: Gynecology;  Laterality: Bilateral;  . ROBOTIC ASSISTED TOTAL HYSTERECTOMY WITH BILATERAL SALPINGO OOPHERECTOMY Bilateral 07/09/2018   Procedure: ROBOTIC ASSISTED TOTAL HYSTERECTOMY WITH BILATERAL SALPINGO OOPHORECTOMY;  Surgeon: Will Bonnet, MD;  Location: [facility];  Service: Gynecology;  Laterality: Bilateral;  . TONSILLECTOMY     Active Ambulatory Problems    Diagnosis Date Noted  . Uterine leiomyoma 02/20/2017  . Bilateral ovarian cysts 02/20/2017  . Intramural leiomyoma of uterus 03/19/2018  . Status post laparoscopic hysterectomy 07/09/2018  . Chronic fatigue 11/20/2017  . Meralgia paraesthetica, left 07/24/2017  . Neuropathy 07/24/2017  . Trigeminal neuralgia 11/20/2016  . Lumbar radiculopathy 11/18/2019  . Chronic radicular lumbar pain 11/18/2019  . Lumbar facet arthropathy 11/18/2019  . Lumbar degenerative disc disease 11/18/2019  . Lumbar spondylosis 11/18/2019   Resolved Ambulatory Problems    Diagnosis Date Noted  . No Resolved Ambulatory Problems   Past Medical History:  Diagnosis Date  . Anxiety   . Arthritis   . Cough   . Depression   . GERD (gastroesophageal reflux disease)   . Hyperlipidemia   . Hypertension   . Thyroid nodule   . Torn meniscus    Assessment  Primary Diagnosis & Pertinent Problem List: The primary encounter diagnosis was Lumbar radiculopathy. Diagnoses of Chronic radicular lumbar pain, Lumbar facet arthropathy, Lumbar degenerative disc disease, and Lumbar spondylosis were also pertinent to this visit.  Visit Diagnosis (New problems to examiner): 1. Lumbar radiculopathy   2. Chronic radicular lumbar pain   3. Lumbar facet arthropathy   4. Lumbar degenerative disc disease    5. Lumbar spondylosis    Plan of Care    63 year old female with a history of low back, bilateral hip pain, right greater than left with occasional radiation to her right posterior lateral knee related to lumbar radiculopathy, lumbar facet joint syndrome.  She has been referred here fast-track for diagnostic lumbar ESI.  Risks and benefits reviewed and patient would like to proceed.  Future considerations could include diagnostic lumbar facet medial branch nerve blocks as the patient does have moderate lumbar spondylosis on recent MRI.  Problem-specific plan: Lumbar radiculopathy Orders Placed This Encounter  Procedures  . LESI (Schedule)    Standing Status:   Future    Standing Expiration Date:   12/19/2019    Scheduling Instructions:     Procedure: Interlaminar Lumbar Epidural Steroid injection (LESI)            Laterality: Midline  Sedation: Patient's choice.     Timeframe: ASAA    Order Specific Question:   Where will this procedure be performed?    Answer:   [facility]    Lumbar facet arthropathy Consider diagnostic lumbar facet medial branch nerve blocks bilaterally at L3, L4, L5, S1 for lumbar facet syndrome and lumbar spondylosis.  It is possible that a component of her right hip pain could be referred pain from her right lumbar facet syndrome.  We will see how patient does with diagnostic lumbar epidural steroid injection can consider diagnostic facet medial branch nerve block for her back and hip pain.  Lumbar degenerative disc disease Continue with physical therapy   Procedure Orders     LESI (Schedule)  Interventional management options: Ms. Reffner was informed that there is no guarantee that she would be a candidate for interventional therapies. The decision will be based on the results of diagnostic studies, as well as Ms. Marzullo risk profile.  Procedure(s) under consideration:  Lumbar epidural steroid injection Lumbar facet medial branch nerve block    Provider-requested follow-up: Return in about 1 week (around 11/25/2019) for Procedure L4/5 ESI , with sedation.  No future appointments.  Total duration of non-face-to-face encounter: 30 minutes.  Primary Care Physician: Derinda Late, MD Location: [facility] Note by: Gillis Santa, MD Date: 11/18/2019; Time: 11:31 AM  Note: This dictation was prepared with Dragon dictation. Any transcriptional errors that may result from this process are unintentional.

## 2019-11-18 NOTE — Telephone Encounter (Signed)
Patient returned call. Stated she would like to try the PO valium and not get the IV sedation. I instructed her of the need for a driver and no public transportation without someone with her.

## 2019-11-18 NOTE — Telephone Encounter (Signed)
Dr. Holley Raring,                 Can you advise on this since you spoke with her today. I don't know what you discussed with her as far as sedation, PO valium ect. Thanks- Anderson Malta

## 2019-11-18 NOTE — Progress Notes (Signed)
Pleasant patient, c/o right hip pain that goes into her right buttocks and down leg.  Also lower back bilaterally which she states an MRI revealed pinched nerves.  Also, has torn meniscus in right knee.  C/o numbness and tingling in left thigh.  Earlier history of a fall which caused a fractured tail bone.  Originally from [place].

## 2019-11-18 NOTE — Assessment & Plan Note (Signed)
Consider diagnostic lumbar facet medial branch nerve blocks bilaterally at L3, L4, L5, S1 for lumbar facet syndrome and lumbar spondylosis.  It is possible that a component of her right hip pain could be referred pain from her right lumbar facet syndrome.  We will see how patient does with diagnostic lumbar epidural steroid injection can consider diagnostic facet medial branch nerve block for her back and hip pain.

## 2019-11-18 NOTE — Assessment & Plan Note (Signed)
-   Continue with physical therapy.

## 2019-11-18 NOTE — Telephone Encounter (Signed)
Per Dr. Holley Raring she can either have the PO valium when she gets here on the day of procedure- or IV sedation, not both. If she calls back please explain this to her. I tried to call. No answer. LVM.

## 2019-11-18 NOTE — Assessment & Plan Note (Signed)
Orders Placed This Encounter  Procedures  . LESI (Schedule)    Standing Status:   Future    Standing Expiration Date:   12/19/2019    Scheduling Instructions:     Procedure: Interlaminar Lumbar Epidural Steroid injection (LESI)            Laterality: Midline     Sedation: Patient's choice.     Timeframe: ASAA    Order Specific Question:   Where will this procedure be performed?    Answer:   [facility]

## 2019-11-30 ENCOUNTER — Telehealth: Payer: Self-pay | Admitting: *Deleted

## 2019-12-02 ENCOUNTER — Ambulatory Visit: Payer: Medicare Other | Admitting: Student in an Organized Health Care Education/Training Program

## 2019-12-23 ENCOUNTER — Encounter: Payer: Self-pay | Admitting: Student in an Organized Health Care Education/Training Program

## 2019-12-23 ENCOUNTER — Ambulatory Visit (HOSPITAL_BASED_OUTPATIENT_CLINIC_OR_DEPARTMENT_OTHER): Payer: Medicare Other | Admitting: Student in an Organized Health Care Education/Training Program

## 2019-12-23 ENCOUNTER — Other Ambulatory Visit: Payer: Self-pay

## 2019-12-23 ENCOUNTER — Ambulatory Visit
Admission: RE | Admit: 2019-12-23 | Discharge: 2019-12-23 | Disposition: A | Discharge status: Home / Self Care | Payer: Medicare Other | Source: Ambulatory Visit | Attending: Student in an Organized Health Care Education/Training Program | Admitting: Student in an Organized Health Care Education/Training Program

## 2019-12-23 DIAGNOSIS — G8929 Other chronic pain: Secondary | ICD-10-CM

## 2019-12-23 DIAGNOSIS — M5416 Radiculopathy, lumbar region: Secondary | ICD-10-CM

## 2019-12-23 MED ORDER — DIAZEPAM 5 MG PO TABS
ORAL_TABLET | ORAL | Status: AC
  Filled 2019-12-23: qty 1

## 2019-12-23 MED ORDER — IOHEXOL 180 MG/ML  SOLN
10.0000 mL | Freq: Once | INTRAMUSCULAR | Status: AC
  Administered 2019-12-23: 10 mL via EPIDURAL
  Filled 2019-12-23: qty 20

## 2019-12-23 MED ORDER — DEXAMETHASONE SODIUM PHOSPHATE 10 MG/ML IJ SOLN
INTRAMUSCULAR | Status: AC
  Filled 2019-12-23: qty 1

## 2019-12-23 MED ORDER — LIDOCAINE HCL 2 % IJ SOLN
20.0000 mL | Freq: Once | INTRAMUSCULAR | Status: AC
  Administered 2019-12-23: 400 mg

## 2019-12-23 MED ORDER — DEXAMETHASONE SODIUM PHOSPHATE 10 MG/ML IJ SOLN
10.0000 mg | Freq: Once | INTRAMUSCULAR | Status: AC
  Administered 2019-12-23: 10 mg

## 2019-12-23 MED ORDER — LIDOCAINE HCL 2 % IJ SOLN
INTRAMUSCULAR | Status: AC
  Filled 2019-12-23: qty 20

## 2019-12-23 MED ORDER — SODIUM CHLORIDE 0.9% FLUSH
2.0000 mL | Freq: Once | INTRAVENOUS | Status: AC
  Administered 2019-12-23: 2 mL

## 2019-12-23 MED ORDER — SODIUM CHLORIDE (PF) 0.9 % IJ SOLN
INTRAMUSCULAR | Status: AC
  Filled 2019-12-23: qty 10

## 2019-12-23 MED ORDER — ROPIVACAINE HCL 2 MG/ML IJ SOLN
2.0000 mL | Freq: Once | INTRAMUSCULAR | Status: AC
  Administered 2019-12-23: 2 mL via EPIDURAL

## 2019-12-23 MED ORDER — DIAZEPAM 5 MG PO TABS
5.0000 mg | ORAL_TABLET | Freq: Once | ORAL | Status: AC
  Administered 2019-12-23: 5 mg via ORAL

## 2019-12-23 MED ORDER — ROPIVACAINE HCL 2 MG/ML IJ SOLN
INTRAMUSCULAR | Status: AC
  Filled 2019-12-23: qty 10

## 2019-12-23 NOTE — Progress Notes (Signed)
Patient's Name: Amanda Hardin  MRN: [id number]  Referring Provider: Derinda Late, MD  DOB: April 07, 1956  PCP: Derinda Late, MD  DOS: 12/23/2019  Note by: Gillis Santa, MD  Service setting: Ambulatory outpatient  Specialty: Interventional Pain Management  Patient type: Established  Location: [facility] (AMB) Pain Management Facility  Visit type: Interventional Procedure   Primary Reason for Visit: Interventional Pain Management Treatment. CC: Back Pain  Procedure:          Anesthesia, Analgesia, Anxiolysis:  Type: Diagnostic Inter-Laminar Epidural Steroid Injection  #1  Region: Lumbar Level: L4-5 Level. Laterality: Right-Sided         Type: Local Anesthesia with 5 mg of p.o. Valium given preprocedure  Local Anesthetic: Lidocaine 1-2%  Position: Prone with head of the table was raised to facilitate breathing.   Indications: 1. Chronic radicular lumbar pain    Pain Score: Pre-procedure: 6 /10 Post-procedure: 0-No pain/10   Pre-op Assessment:  Amanda Hardin is a 64 y.o. (year old), female patient, seen today for interventional treatment. She  has a past surgical history that includes Cesarean section; Tonsillectomy; Robotic assisted total hysterectomy with bilateral salpingo oophorectomy (Bilateral, 07/09/2018); Cystoscopy (N/A, 07/09/2018); Robotic assisted laparoscopic ovarian cystectomy (Bilateral, 07/09/2018); Breast biopsy (Left, 09/2015); Breast biopsy (Right); and Breast excisional biopsy (Left, 2014). Amanda Hardin has a current medication list which includes the following prescription(s): chlorthalidone, duloxetine, enalapril, meclizine, pantoprazole, polyethyl glycol-propyl glycol, pravastatin, pregabalin, sertraline, tramadol, and valacyclovir. Her primarily concern today is the Back Pain  Initial Vital Signs:  Pulse/HCG Rate: 87ECG Heart Rate: 74 Temp: (!) 96.8 F (36 C) Resp: 14 BP: 138/83 SpO2: 99 %  BMI: Estimated body mass index is 32.03 kg/m as calculated from the following:    Height as of this encounter: 5' (1.524 m).   Weight as of this encounter: 164 lb (74.4 kg).  Risk Assessment: Allergies: Reviewed. She is allergic to norco [hydrocodone-acetaminophen] and penicillin g.  Allergy Precautions: None required Coagulopathies: Reviewed. None identified.  Blood-thinner therapy: None at this time Active Infection(s): Reviewed. None identified. Amanda Hardin is afebrile  Site Confirmation: Amanda Hardin was asked to confirm the procedure and laterality before marking the site Procedure checklist: Completed Consent: Before the procedure and under the influence of no sedative(s), amnesic(s), or anxiolytics, the patient was informed of the treatment options, risks and possible complications. To fulfill our ethical and legal obligations, as recommended by the American Medical Association's Code of Ethics, I have informed the patient of my clinical impression; the nature and purpose of the treatment or procedure; the risks, benefits, and possible complications of the intervention; the alternatives, including doing nothing; the risk(s) and benefit(s) of the alternative treatment(s) or procedure(s); and the risk(s) and benefit(s) of doing nothing. The patient was provided information about the general risks and possible complications associated with the procedure. These may include, but are not limited to: failure to achieve desired goals, infection, bleeding, organ or nerve damage, allergic reactions, paralysis, and death. In addition, the patient was informed of those risks and complications associated to Spine-related procedures, such as failure to decrease pain; infection (i.e.: Meningitis, epidural or intraspinal abscess); bleeding (i.e.: epidural hematoma, subarachnoid hemorrhage, or any other type of intraspinal or peri-dural bleeding); organ or nerve damage (i.e.: Any type of peripheral nerve, nerve root, or spinal cord injury) with subsequent damage to sensory, motor, and/or autonomic  systems, resulting in permanent pain, numbness, and/or weakness of one or several areas of the body; allergic reactions; (i.e.: anaphylactic reaction); and/or death. Furthermore, the  patient was informed of those risks and complications associated with the medications. These include, but are not limited to: allergic reactions (i.e.: anaphylactic or anaphylactoid reaction(s)); adrenal axis suppression; blood sugar elevation that in diabetics may result in ketoacidosis or comma; water retention that in patients with history of congestive heart failure may result in shortness of breath, pulmonary edema, and decompensation with resultant heart failure; weight gain; swelling or edema; medication-induced neural toxicity; particulate matter embolism and blood vessel occlusion with resultant organ, and/or nervous system infarction; and/or aseptic necrosis of one or more joints. Finally, the patient was informed that Medicine is not an exact science; therefore, there is also the possibility of unforeseen or unpredictable risks and/or possible complications that may result in a catastrophic outcome. The patient indicated having understood very clearly. We have given the patient no guarantees and we have made no promises. Enough time was given to the patient to ask questions, all of which were answered to the patient's satisfaction. Amanda Hardin has indicated that she wanted to continue with the procedure. Attestation: I, the ordering provider, attest that I have discussed with the patient the benefits, risks, side-effects, alternatives, likelihood of achieving goals, and potential problems during recovery for the procedure that I have provided informed consent. Date  Time: 12/23/2019  9:09 AM  Pre-Procedure Preparation:  Monitoring: As per clinic protocol. Respiration, ETCO2, SpO2, BP, heart rate and rhythm monitor placed and checked for adequate function Safety Precautions: Patient was assessed for positional comfort and  pressure points before starting the procedure. Time-out: I initiated and conducted the "Time-out" before starting the procedure, as per protocol. The patient was asked to participate by confirming the accuracy of the "Time Out" information. Verification of the correct person, site, and procedure were performed and confirmed by me, the nursing staff, and the patient. "Time-out" conducted as per Joint Commission's Universal Protocol (UP.01.01.01). Time: 1004  Description of Procedure:          Target Area: The interlaminar space, initially targeting the lower laminar border of the superior vertebral body. Approach: Paramedial approach. Area Prepped: Entire Posterior Lumbar Region Prepping solution: DuraPrep (Iodine Povacrylex [0.7% available iodine] and Isopropyl Alcohol, 74% w/w) Safety Precautions: Aspiration looking for blood return was conducted prior to all injections. At no point did we inject any substances, as a needle was being advanced. No attempts were made at seeking any paresthesias. Safe injection practices and needle disposal techniques used. Medications properly checked for expiration dates. SDV (single dose vial) medications used. Description of the Procedure: Protocol guidelines were followed. The procedure needle was introduced through the skin, ipsilateral to the reported pain, and advanced to the target area. Bone was contacted and the needle walked caudad, until the lamina was cleared. The epidural space was identified using "loss-of-resistance technique" with 2-3 ml of PF-NaCl (0.9% NSS), in a 5cc LOR glass syringe.  Vitals:   12/23/19 1007 12/23/19 1012 12/23/19 1019 12/23/19 1030  BP: 120/70 (P) 129/74 (!) 111/56 (!) 121/58  Pulse:      Resp: 15 (P) 14 16 16   Temp:   97.7 F (36.5 C)   SpO2: 99% (P) 99% 94% 96%  Weight:      Height:        Start Time: 1004 hrs. End Time: 1011 hrs.  Materials:  Needle(s) Type: Epidural needle Gauge: 17G Length:  3.5-in Medication(s): Please see orders for medications and dosing details. 8 cc solution made of 5 cc of preservative-free saline, 2 cc of 0.2% ropivacaine, 1  cc of Decadron 10 mg/cc.  Imaging Guidance (Spinal):          Type of Imaging Technique: Fluoroscopy Guidance (Spinal) Indication(s): Assistance in needle guidance and placement for procedures requiring needle placement in or near specific anatomical locations not easily accessible without such assistance. Exposure Time: Please see nurses notes. Contrast: Before injecting any contrast, we confirmed that the patient did not have an allergy to iodine, shellfish, or radiological contrast. Once satisfactory needle placement was completed at the desired level, radiological contrast was injected. Contrast injected under live fluoroscopy. No contrast complications. See chart for type and volume of contrast used. Fluoroscopic Guidance: I was personally present during the use of fluoroscopy. "Tunnel Vision Technique" used to obtain the best possible view of the target area. Parallax error corrected before commencing the procedure. "Direction-depth-direction" technique used to introduce the needle under continuous pulsed fluoroscopy. Once target was reached, antero-posterior, oblique, and lateral fluoroscopic projection used confirm needle placement in all planes. Images permanently stored in EMR. Interpretation: I personally interpreted the imaging intraoperatively. Adequate needle placement confirmed in multiple planes. Appropriate spread of contrast into desired area was observed. No evidence of afferent or efferent intravascular uptake. No intrathecal or subarachnoid spread observed. Permanent images saved into the patient's record.  Antibiotic Prophylaxis:   Anti-infectives (From admission, onward)   None     Indication(s): None identified  Post-operative Assessment:  Post-procedure Vital Signs:  Pulse/HCG Rate: 8778 Temp: 97.7 F (36.5  C) Resp: 16 BP: (!) 121/58 SpO2: 96 %  EBL: None  Complications: No immediate post-treatment complications observed by team, or reported by patient.  Note: The patient tolerated the entire procedure well. A repeat set of vitals were taken after the procedure and the patient was kept under observation following institutional policy, for this type of procedure. Post-procedural neurological assessment was performed, showing return to baseline, prior to discharge. The patient was provided with post-procedure discharge instructions, including a section on how to identify potential problems. Should any problems arise concerning this procedure, the patient was given instructions to immediately contact us, at any time, without hesitation. In any case, we plan to contact the patient by telephone for a follow-up status report regarding this interventional procedure.  Comments:  No additional relevant information.  Plan of Care  Orders:  Orders Placed This Encounter  Procedures  . Fluoro (C-Arm) (<60 min) (No Report)    Intraoperative interpretation by procedural physician at [facility].    Standing Status:   Standing    Number of Occurrences:   1    Order Specific Question:   Reason for exam:    Answer:   Assistance in needle guidance and placement for procedures requiring needle placement in or near specific anatomical locations not easily accessible without such assistance.  . AMB PT Referral (2-3x/wk, x 6wks)    Referral Priority:   Routine    Referral Type:   Physical Medicine    Referral Reason:   Specialty Services Required    Requested Specialty:   Physical Therapy    Number of Visits Requested:   1    Medications ordered for procedure: Meds ordered this encounter  Medications  . iohexol (OMNIPAQUE) 180 MG/ML injection 10 mL    Must be Myelogram-compatible. If not available, you may substitute with a water-soluble, non-ionic, hypoallergenic, myelogram-compatible  radiological contrast medium.  Marland Kitchen lidocaine (XYLOCAINE) 2 % (with pres) injection 400 mg  . sodium chloride flush (NS) 0.9 % injection 2 mL  . ropivacaine (  PF) 2 mg/mL (0.2%) (NAROPIN) injection 2 mL  . dexamethasone (DECADRON) injection 10 mg  . diazepam (VALIUM) tablet 5 mg   Medications administered: We administered iohexol, lidocaine, sodium chloride flush, ropivacaine (PF) 2 mg/mL (0.2%), dexamethasone, and diazepam.  See the medical record for exact dosing, route, and time of administration.  Follow-up plan:   Return in about 4 weeks (around 01/20/2020) for Post Procedure Evaluation.      Status post right L4-L5 ESI #1 12/23/2019   Recent Visits Date Type Provider Dept  11/18/19 Office Visit Gillis Santa, MD [facility]  Showing recent visits within past 90 days and meeting all other requirements   Today's Visits Date Type Provider Dept  12/23/19 Procedure visit Gillis Santa, MD [facility]  Showing today's visits and meeting all other requirements   Future Appointments Date Type Provider Dept  01/20/20 Appointment Gillis Santa, MD [facility]  Showing future appointments within next 90 days and meeting all other requirements   Disposition: Discharge home  Discharge Date & Time: 12/23/2019; 1031 hrs.   Primary Care Physician: Derinda Late, MD Location: [facility] Note by: Gillis Santa, MD Date: 12/23/2019; Time: 1:07 PM  Disclaimer:  Medicine is not an exact science. The only guarantee in medicine is that nothing is guaranteed. It is important to note that the decision to proceed with this intervention was based on the information collected from the patient. The Data and conclusions were drawn from the patient's questionnaire, the interview, and the physical examination. Because the information was provided in large part by the patient, it cannot be guaranteed that it has not been purposely or unconsciously  manipulated. Every effort has been made to obtain as much relevant data as possible for this evaluation. It is important to note that the conclusions that lead to this procedure are derived in large part from the available data. Always take into account that the treatment will also be dependent on availability of resources and existing treatment guidelines, considered by other Pain Management Practitioners as being common knowledge and practice, at the time of the intervention. For Medico-Legal purposes, it is also important to point out that variation in procedural techniques and pharmacological choices are the acceptable norm. The indications, contraindications, technique, and results of the above procedure should only be interpreted and judged by a Board-Certified Interventional Pain Specialist with extensive familiarity and expertise in the same exact procedure and technique.

## 2019-12-23 NOTE — Progress Notes (Signed)
Safety precautions to be maintained throughout the outpatient stay will include: orient to surroundings, keep bed in low position, maintain call bell within reach at all times, provide assistance with transfer out of bed and ambulation.  

## 2019-12-24 ENCOUNTER — Telehealth: Payer: Self-pay

## 2019-12-24 NOTE — Telephone Encounter (Signed)
Post procedure phone call. Patient states she is doing good.  

## 2020-01-19 ENCOUNTER — Telehealth: Payer: Self-pay

## 2020-01-19 NOTE — Telephone Encounter (Signed)
She returned your call from yesterday, she has appt tomorrow

## 2020-01-19 NOTE — Telephone Encounter (Signed)
Attempted to call patient back and it went automatically to voicemail. Message left to call us back for tomorrows visit.

## 2020-01-20 ENCOUNTER — Encounter: Payer: Self-pay | Admitting: Student in an Organized Health Care Education/Training Program

## 2020-01-20 ENCOUNTER — Ambulatory Visit
Payer: Medicare Other | Attending: Student in an Organized Health Care Education/Training Program | Admitting: Student in an Organized Health Care Education/Training Program

## 2020-01-20 ENCOUNTER — Other Ambulatory Visit: Payer: Self-pay

## 2020-01-20 DIAGNOSIS — G8929 Other chronic pain: Secondary | ICD-10-CM

## 2020-01-20 DIAGNOSIS — M5416 Radiculopathy, lumbar region: Secondary | ICD-10-CM

## 2020-01-20 NOTE — Progress Notes (Signed)
I attempted to call the patient however no response. Voicemail left instructing patient to call front desk office at [phone] to reschedule appointment. -Dr Kaylinn Dedic  

## 2020-01-25 ENCOUNTER — Encounter: Payer: Self-pay | Admitting: Student in an Organized Health Care Education/Training Program

## 2020-01-25 ENCOUNTER — Ambulatory Visit
Payer: Medicare Other | Attending: Student in an Organized Health Care Education/Training Program | Admitting: Student in an Organized Health Care Education/Training Program

## 2020-01-25 ENCOUNTER — Other Ambulatory Visit: Payer: Self-pay

## 2020-01-25 DIAGNOSIS — M5416 Radiculopathy, lumbar region: Secondary | ICD-10-CM

## 2020-01-25 DIAGNOSIS — G8929 Other chronic pain: Secondary | ICD-10-CM | POA: Diagnosis not present

## 2020-01-25 NOTE — Progress Notes (Signed)
Patient: Amanda Hardin  Service Category: E/M  Provider: Gillis Santa, MD  DOB: Jan 05, 1956  DOS: 01/25/2020  Location: Office  MRN: [id number]  Setting: Ambulatory outpatient  Referring Provider: Derinda Late, MD  Type: Established Patient  Specialty: Interventional Pain Management  PCP: Derinda Late, MD  Location: Home  Delivery: TeleHealth     Virtual Encounter - Pain Management PROVIDER NOTE: Information contained herein reflects review and annotations entered in association with encounter. Interpretation of such information and data should be left to medically-trained personnel. Information provided to patient can be located elsewhere in the medical record under "Patient Instructions". Document created using STT-dictation technology, any transcriptional errors that may result from process are unintentional.    Contact & Pharmacy Preferred: [phone] Home: [phone] (home) Mobile: [phone] (mobile) E-mail: [email or web address] .[id number] [address], [place] [address] [place] [address] Phone: [phone] Fax: [place], [place] [address] [place] [address] Phone: [phone] Fax: [phone]   Pre-screening  Amanda Hardin offered "in-person" vs "virtual" encounter. She indicated preferring virtual for this encounter.   Reason COVID-19*  Social distancing based on CDC and AMA recommendations.   I contacted Amanda Hardin on 01/25/2020 via telephone.      I clearly identified myself as Gillis Santa, MD. I verified that I was speaking with the correct person using two identifiers (Name: Amanda Hardin, and date of birth: 07/24/1956).  This visit was completed via telephone due to the restrictions of the COVID-19 pandemic. All issues as above were discussed and addressed but no physical exam was performed. If it  was felt that the patient should be evaluated in the office, they were directed there. The patient verbally consented to this visit. Patient was unable to complete an audio/visual visit due to Technical difficulties and/or Lack of internet. Due to the catastrophic nature of the COVID-19 pandemic, this visit was done through audio contact only.  Location of the patient: home address (see Epic for details)  Location of the provider: office  Consent I sought verbal advanced consent from Amanda Hardin for virtual visit interactions. I informed Amanda Hardin of possible security and privacy concerns, risks, and limitations associated with providing "not-in-person" medical evaluation and management services. I also informed Amanda Hardin of the availability of "in-person" appointments. Finally, I informed her that there would be a charge for the virtual visit and that she could be  personally, fully or partially, financially responsible for it. Amanda Hardin expressed understanding and agreed to proceed.   Historic Elements   Amanda Hardin is a 64 y.o. year old, female patient evaluated today after her last contact with our practice on 01/20/2020. Amanda Hardin  has a past medical history of Anxiety, Arthritis, Cough, Depression, GERD (gastroesophageal reflux disease), Hyperlipidemia, Hypertension, Thyroid nodule, and Torn meniscus. She also  has a past surgical history that includes Cesarean section; Tonsillectomy; Robotic assisted total hysterectomy with bilateral salpingo oophorectomy (Bilateral, 07/09/2018); Cystoscopy (N/A, 07/09/2018); Robotic assisted laparoscopic ovarian cystectomy (Bilateral, 07/09/2018); Breast biopsy (Left, 09/2015); Breast biopsy (Right); and Breast excisional biopsy (Left, 2014). Amanda Hardin has a current medication list which includes the following prescription(s): chlorthalidone, diclofenac, duloxetine, enalapril, meclizine, pantoprazole, polyethyl glycol-propyl glycol, pravastatin, pregabalin,  sertraline, tramadol, and valacyclovir. She  reports that she quit smoking about 17 years ago. Her smoking use included cigarettes. She quit after 25.00 years  of use. She has never used smokeless tobacco. She reports that she does not drink alcohol or use drugs. Amanda Hardin is allergic to norco [hydrocodone-acetaminophen] and penicillin g.   HPI  Today, she is being contacted for a post-procedure assessment.  Evaluation of last interventional procedure  01/20/2020 Procedure:   Right L4-5 ESI #1  Sedation: Please see nurses note for DOS. When no sedatives are used, the analgesic levels obtained are directly associated to the effectiveness of the local anesthetics. However, when sedation is provided, the level of analgesia obtained during the initial 1 hour following the intervention, is believed to be the result of a combination of factors. These factors may include, but are not limited to: 1. The effectiveness of the local anesthetics used. 2. The effects of the analgesic(s) and/or anxiolytic(s) used. 3. The degree of discomfort experienced by the patient at the time of the procedure. 4. The patients ability and reliability in recalling and recording the events. 5. The presence and influence of possible secondary gains and/or psychosocial factors. Reported result: Relief experienced during the 1st hour after the procedure: 100 % (Ultra-Short Term Relief)            Interpretative annotation: Clinically appropriate result. Analgesia during this period is likely to be Local Anesthetic and/or IV Sedative (Analgesic/Anxiolytic) related.          Effects of local anesthetic: The analgesic effects attained during this period are directly associated to the localized infiltration of local anesthetics and therefore cary significant diagnostic value as to the etiological location, or anatomical origin, of the pain. Expected duration of relief is directly dependent on the pharmacodynamics of the local anesthetic  used. Long-acting (4-6 hours) anesthetics used.  Reported result: Relief during the next 4 to 6 hour after the procedure: 100 % (Short-Term Relief)            Interpretative annotation: Clinically appropriate result. Analgesia during this period is likely to be Local Anesthetic-related.          Long-term benefit: Defined as the period of time past the expected duration of local anesthetics (1 hour for short-acting and 4-6 hours for long-acting). With the possible exception of prolonged sympathetic blockade from the local anesthetics, benefits during this period are typically attributed to, or associated with, other factors such as analgesic sensory neuropraxia, antiinflammatory effects, or beneficial biochemical changes provided by agents other than the local anesthetics.  Reported result: Extended relief following procedure: 100 %(did not have pain for the first few days and then pain returned slightly, giving pain score 4.  does have PT this afternoon.) (Long-Term Relief)            Interpretative annotation: Clinically appropriate result. Good relief. No permanent benefit expected. Inflammation plays a part in the etiology to the pain.           Laboratory Chemistry Profile   Renal Lab Results  Component Value Date   BUN 16 07/10/2018   CREATININE 1.14 (H) 07/10/2018   GFRAA 58 (L) 07/10/2018   GFRNONAA 50 (L) 07/10/2018    Hepatic Lab Results  Component Value Date   AST 32 07/09/2018   ALT 12 07/09/2018   ALBUMIN 3.3 (L) 07/09/2018   ALKPHOS 51 07/09/2018    Electrolytes Lab Results  Component Value Date   NA 139 07/10/2018   K 3.5 07/10/2018   CL 104 07/10/2018   CALCIUM 8.0 (L) 07/10/2018    Bone No results found for: [place], [id number], [id number], [id number], [place],  25OHVITD2, 25OHVITD3, TESTOFREE, TESTOSTERONE  Coagulation Lab Results  Component Value Date   PLT 215 07/10/2018    Cardiovascular Lab Results  Component Value Date   HGB 10.3 (L) 07/10/2018   HCT  30.7 (L) 07/10/2018    Inflammation (CRP: Acute Phase) (ESR: Chronic Phase) No results found for: CRP, ESRSEDRATE, LATICACIDVEN    Note: Above Lab results reviewed.   Assessment  The primary encounter diagnosis was Chronic radicular lumbar pain. A diagnosis of Lumbar radiculopathy was also pertinent to this visit.  Plan of Care  I am having Amanda Hardin maintain her chlorthalidone, enalapril, meclizine, pravastatin, sertraline, valACYclovir, pregabalin, pantoprazole, Polyethyl Glycol-Propyl Glycol (SYSTANE OP), traMADol, DULoxetine, and diclofenac.  Patient did get benefit with right L4-L5 ESI.  At that time she was having more right-sided pain.  She is now endorsing left hip and buttock pain.  She is seeing physical therapy.  I did offer her another epidural steroid injection more towards the left at L4-L5 to see if this helps out with her left hip pain which could certainly be coming from a radicular source.  Patient states that she will try and work with physical therapy for a couple of sessions and if that is not helping, she will consider repeating lumbar epidural steroid injection.  PRN order placed as below.  Orders:  Orders Placed This Encounter  Procedures  . Lumbar Epidural Injection    Standing Status:   Standing    Number of Occurrences:   9    Standing Expiration Date:   07/24/2021    Scheduling Instructions:     Purpose: Palliative     Indication: Lower extremity pain/Sciatica unspecified side (M54.30).     Side: Midline     Level: TBD     Sedation: Patient's choice.     TIMEFRAME: PRN procedure. (Ms. Bonifas will call when needed.)    Order Specific Question:   Where will this procedure be performed?    Answer:   [facility]   Follow-up plan:   Return if symptoms worsen or fail to improve.     Status post right L4-L5 ESI #1 12/23/2019- helped, repeat PRN    Recent Visits Date Type Provider Dept  12/23/19 Procedure visit Gillis Santa, MD [facility]   11/18/19 Office Visit Gillis Santa, MD [facility]  Showing recent visits within past 90 days and meeting all other requirements   Today's Visits Date Type Provider Dept  01/25/20 Office Visit Gillis Santa, MD [facility]  Showing today's visits and meeting all other requirements   Future Appointments No visits were found meeting these conditions.  Showing future appointments within next 90 days and meeting all other requirements   I discussed the assessment and treatment plan with the patient. The patient was provided an opportunity to ask questions and all were answered. The patient agreed with the plan and demonstrated an understanding of the instructions.  Patient advised to call back or seek an in-person evaluation if the symptoms or condition worsens.  Duration of encounter: 6mnutes.  Note by: BGillis Santa MD Date: 01/25/2020; Time: 3:03 PM

## 2020-03-11 ENCOUNTER — Ambulatory Visit: Payer: Medicare Other | Attending: Internal Medicine

## 2020-03-11 DIAGNOSIS — Z23 Encounter for immunization: Secondary | ICD-10-CM

## 2020-03-11 NOTE — Progress Notes (Signed)
   Covid-19 Vaccination Clinic  Name:  Evilyn Kazarian    MRN: [id number] DOB: 02-27-1956  03/11/2020  Ms. Conk was observed post Covid-19 immunization for 15 minutes without incident. She was provided with Vaccine Information Sheet and instruction to access the V-Safe system.   Ms. Vannest was instructed to call 911 with any severe reactions post vaccine: Marland Kitchen Difficulty breathing  . Swelling of face and throat  . A fast heartbeat  . A bad rash all over body  . Dizziness and weakness   Immunizations Administered    Name Date Dose VIS Date Route   Pfizer COVID-19 Vaccine 03/11/2020  1:33 PM 0.3 mL 11/27/2019 Intramuscular   Manufacturer: [place]   Lot: [id number]   [place]: [id number]

## 2020-04-05 ENCOUNTER — Ambulatory Visit: Payer: Medicare Other | Attending: Internal Medicine

## 2020-04-05 DIAGNOSIS — Z23 Encounter for immunization: Secondary | ICD-10-CM

## 2020-04-05 NOTE — Progress Notes (Signed)
   Covid-19 Vaccination Clinic  Name:  Amanda Hardin    MRN: [id number] DOB: [birth year]  04/05/2020  [name] was observed post Covid-19 immunization for 15 minutes without incident. She was provided with Vaccine Information Sheet and instruction to access the V-Safe system.   [name] was instructed to call 911 with any severe reactions post vaccine: Marland Kitchen Difficulty breathing  . Swelling of face and throat  . A fast heartbeat  . A bad rash all over body  . Dizziness and weakness   Immunizations Administered    Name Date Dose VIS Date Route   Pfizer COVID-19 Vaccine 04/05/2020  1:16 PM 0.3 mL 02/10/2019 Intramuscular   Manufacturer: [place]   Lot: [id number]   [place]: [id number]

## 2020-07-26 DIAGNOSIS — E119 Type 2 diabetes mellitus without complications: Secondary | ICD-10-CM | POA: Insufficient documentation

## 2020-09-28 ENCOUNTER — Other Ambulatory Visit: Payer: Self-pay | Admitting: Family Medicine

## 2020-09-28 DIAGNOSIS — Z1231 Encounter for screening mammogram for malignant neoplasm of breast: Secondary | ICD-10-CM

## 2020-10-04 ENCOUNTER — Other Ambulatory Visit: Payer: Self-pay

## 2020-10-04 ENCOUNTER — Ambulatory Visit
Admission: RE | Admit: 2020-10-04 | Discharge: 2020-10-04 | Disposition: A | Discharge status: Home / Self Care | Payer: Medicare Other | Source: Ambulatory Visit | Attending: Family Medicine | Admitting: Family Medicine

## 2020-10-04 DIAGNOSIS — Z1231 Encounter for screening mammogram for malignant neoplasm of breast: Secondary | ICD-10-CM | POA: Insufficient documentation

## 2020-10-11 ENCOUNTER — Other Ambulatory Visit: Payer: Self-pay | Admitting: Family Medicine

## 2020-10-11 DIAGNOSIS — R928 Other abnormal and inconclusive findings on diagnostic imaging of breast: Secondary | ICD-10-CM

## 2020-10-11 DIAGNOSIS — N6310 Unspecified lump in the right breast, unspecified quadrant: Secondary | ICD-10-CM

## 2020-10-19 ENCOUNTER — Other Ambulatory Visit: Payer: Self-pay

## 2020-10-19 ENCOUNTER — Ambulatory Visit
Admission: RE | Admit: 2020-10-19 | Discharge: 2020-10-19 | Disposition: A | Discharge status: Home / Self Care | Payer: Medicare Other | Source: Ambulatory Visit | Attending: Family Medicine | Admitting: Family Medicine

## 2020-10-19 DIAGNOSIS — N6310 Unspecified lump in the right breast, unspecified quadrant: Secondary | ICD-10-CM

## 2020-10-19 DIAGNOSIS — R928 Other abnormal and inconclusive findings on diagnostic imaging of breast: Secondary | ICD-10-CM

## 2020-10-25 ENCOUNTER — Other Ambulatory Visit: Payer: Self-pay | Admitting: Family Medicine

## 2020-10-25 DIAGNOSIS — R928 Other abnormal and inconclusive findings on diagnostic imaging of breast: Secondary | ICD-10-CM

## 2020-10-26 ENCOUNTER — Ambulatory Visit
Admission: RE | Admit: 2020-10-26 | Discharge: 2020-10-26 | Disposition: A | Discharge status: Home / Self Care | Payer: Medicare Other | Source: Ambulatory Visit | Attending: Family Medicine | Admitting: Family Medicine

## 2020-10-26 ENCOUNTER — Other Ambulatory Visit: Payer: Self-pay

## 2020-10-26 DIAGNOSIS — R928 Other abnormal and inconclusive findings on diagnostic imaging of breast: Secondary | ICD-10-CM

## 2020-10-26 HISTORY — PX: BREAST BIOPSY: SHX20

## 2020-10-28 ENCOUNTER — Encounter: Payer: Self-pay | Admitting: *Deleted

## 2020-10-28 ENCOUNTER — Telehealth: Payer: Self-pay | Admitting: Obstetrics and Gynecology

## 2020-10-28 NOTE — Telephone Encounter (Signed)
Pt aware.

## 2020-10-28 NOTE — Telephone Encounter (Signed)
Pt calling; [facility] has informed her she has Invasive Mammary Carcinoma; would like SDJ's rec for breast surgeon and oncologist.  [phone]

## 2020-10-28 NOTE — Telephone Encounter (Signed)
Patient called stating she was recently diagnosed with breast cancer. She would like to know if you are able to provide your recommendation to  her on a good surgeon and oncologist.

## 2020-10-28 NOTE — Progress Notes (Signed)
Notified by Stacie Acres, RN of [facility], that patient is aware of her new diagnosis of invasive mammary carcinoma. Called patient to establish navigation services.  Patient states she has had about 6-8 breast biopsies on bilateral breast when she lived in [place].  She would like time to think about going back to [place] for surgery or staying local.  Names of local surgeons and practices given to patient.  I will call her back on Monday to discuss further.  She was encouraged to call with any questions.

## 2020-10-28 NOTE — Telephone Encounter (Signed)
Pt has not been seen in 2 years. She needs appt

## 2020-10-31 ENCOUNTER — Encounter: Payer: Self-pay | Admitting: *Deleted

## 2020-10-31 ENCOUNTER — Other Ambulatory Visit: Payer: Self-pay | Admitting: General Surgery

## 2020-10-31 DIAGNOSIS — C50911 Malignant neoplasm of unspecified site of right female breast: Secondary | ICD-10-CM

## 2020-10-31 DIAGNOSIS — C50311 Malignant neoplasm of lower-inner quadrant of right female breast: Secondary | ICD-10-CM

## 2020-10-31 LAB — SURGICAL PATHOLOGY

## 2020-10-31 NOTE — Progress Notes (Signed)
Patient returned my call.  She wants to stay local for surgery and oncology.  She has requested to see Dr. Bary Castilla and Dr. Grayland Ormond.  I have her scheduled today to see Dr. Bary Castilla at 3:00 and Dr. Grayland Ormond on Thursday at 11:15.  Dr. Dwyane Luo office to give patient breast cancer educational literature, "My Breast Cancer Treatment Handbook" by Josephine Igo, RN.  Patient is to call with any questions or needs.

## 2020-10-31 NOTE — Progress Notes (Signed)
Left message for patient to return my call.  I would like to assist with scheduling surgical and medical oncology consults if she has decided to stay local for her treatment.

## 2020-11-01 ENCOUNTER — Telehealth: Payer: Self-pay | Admitting: *Deleted

## 2020-11-01 NOTE — Telephone Encounter (Signed)
VM TO PT TO SCHD BIL BR MRI

## 2020-11-03 ENCOUNTER — Inpatient Hospital Stay: Payer: Medicare Other | Attending: Oncology | Admitting: Oncology

## 2020-11-03 ENCOUNTER — Encounter: Payer: Self-pay | Admitting: *Deleted

## 2020-11-03 ENCOUNTER — Encounter: Payer: Self-pay | Admitting: Oncology

## 2020-11-03 ENCOUNTER — Inpatient Hospital Stay: Payer: Medicare Other

## 2020-11-03 DIAGNOSIS — E785 Hyperlipidemia, unspecified: Secondary | ICD-10-CM | POA: Insufficient documentation

## 2020-11-03 DIAGNOSIS — F32A Depression, unspecified: Secondary | ICD-10-CM | POA: Insufficient documentation

## 2020-11-03 DIAGNOSIS — Z79899 Other long term (current) drug therapy: Secondary | ICD-10-CM | POA: Diagnosis not present

## 2020-11-03 DIAGNOSIS — C50311 Malignant neoplasm of lower-inner quadrant of right female breast: Secondary | ICD-10-CM | POA: Diagnosis not present

## 2020-11-03 DIAGNOSIS — Z170 Estrogen receptor positive status [ER+]: Secondary | ICD-10-CM | POA: Diagnosis not present

## 2020-11-03 DIAGNOSIS — I10 Essential (primary) hypertension: Secondary | ICD-10-CM | POA: Insufficient documentation

## 2020-11-03 NOTE — Progress Notes (Signed)
New patient evaluation.   

## 2020-11-03 NOTE — Progress Notes (Signed)
Met patient today during her initial medical oncology consult.  She is scheduled for a breast MRI.  Surgery is pending MRI results. She is to let me know her surgery date and I will schedule her to return to see Dr. Grayland Ormond for final treatment planning.   Dr. Dwyane Luo office gave patient breast cancer educational literature, "My Breast Cancer Treatment Handbook" by Josephine Igo, RN.  She is to call with any questions or needs.

## 2020-11-04 ENCOUNTER — Ambulatory Visit: Admission: RE | Admit: 2020-11-04 | Payer: Medicare Other | Source: Ambulatory Visit

## 2020-11-05 DIAGNOSIS — C50311 Malignant neoplasm of lower-inner quadrant of right female breast: Secondary | ICD-10-CM | POA: Insufficient documentation

## 2020-11-05 NOTE — Progress Notes (Signed)
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  INTERVAL HISTORY: Patient is a 64 year old female who was noted to have an abnormal lesion on routine screening mammogram.  Subsequent ultrasound biopsy revealed the above-stated malignancy.  This appears to be a stage I cancer, but size is unclear based on imaging and MRI of the breast is pending.  She currently feels well and is asymptomatic.  She has no neurologic complaints.  She denies any recent fevers or illnesses.  She has a good appetite and denies weight loss.  She has no chest pain, shortness of breath, cough, or hemoptysis.  She denies any nausea, vomiting, constipation, or diarrhea.  She has no urinary complaints.  Patient feels at her baseline and offers no specific complaints today.  REVIEW OF SYSTEMS:   Review of Systems  Constitutional: Negative.  Negative for fever, malaise/fatigue and weight loss.  Respiratory: Negative.  Negative for cough, hemoptysis and shortness of breath.   Cardiovascular: Negative.  Negative for chest pain and leg swelling.  Gastrointestinal: Negative.  Negative for abdominal pain.  Genitourinary: Negative.  Negative for dysuria.  Musculoskeletal: Negative.  Negative for back pain.  Skin: Negative.  Negative for rash.  Neurological: Negative.  Negative for dizziness, focal weakness, weakness and headaches.  Psychiatric/Behavioral: Negative.  The patient is not nervous/anxious.     As per HPI. Otherwise, a complete review of systems is negative.  PAST MEDICAL HISTORY: Past Medical History:   Diagnosis Date  . Anxiety   . Arthritis   . Cough   . Depression   . GERD (gastroesophageal reflux disease)   . Hyperlipidemia   . Hypertension   . Thyroid nodule   . Torn meniscus    R     PAST SURGICAL HISTORY: Past Surgical History:  Procedure Laterality Date  . BREAST BIOPSY Left 09/2015   [place] core bx  NEG   . BREAST BIOPSY Right    both core and excisional done, multiples for fibroadenomas  . BREAST BIOPSY Right 10/26/2020   stereo biopsy, clip, path pending  . BREAST EXCISIONAL BIOPSY Left 2014   fibroadenomas  . CESAREAN SECTION    . CYSTOSCOPY N/A 07/09/2018   Procedure: CYSTOSCOPY;  Surgeon: Will Bonnet, MD;  Location: [facility] ORS;  Service: Gynecology;  Laterality: N/A;  . ROBOTIC ASSISTED LAPAROSCOPIC OVARIAN CYSTECTOMY Bilateral 07/09/2018   Procedure: ROBOTIC ASSISTED LAPAROSCOPIC OVARIAN CYSTECTOMY;  Surgeon: Will Bonnet, MD;  Location: [facility];  Service: Gynecology;  Laterality: Bilateral;  . ROBOTIC ASSISTED TOTAL HYSTERECTOMY WITH BILATERAL SALPINGO OOPHERECTOMY Bilateral 07/09/2018   Procedure: ROBOTIC ASSISTED TOTAL HYSTERECTOMY WITH BILATERAL SALPINGO OOPHORECTOMY;  Surgeon: Will Bonnet, MD;  Location: [facility] OR[facility];  Service: Gynecology;  Laterality: Bilateral;  . TONSILLECTOMY      FAMILY HISTORY: Family History  Problem Relation Age of Onset  . Diabetes Maternal Aunt   . Hypertension Maternal Aunt   . Prostate cancer Father   . Heart failure Father   . Pancreatic cancer Paternal Aunt   . Breast cancer Neg Hx   . Bladder Cancer Neg Hx   . Kidney cancer Neg Hx  ADVANCED DIRECTIVES (Y/N):  N  HEALTH MAINTENANCE: Social History   Tobacco Use  . Smoking status: Former Smoker    Years: 25.00    Types: Cigarettes    Quit date: 03/19/2002    Years since quitting: 18.6  . Smokeless tobacco: Never Used  Vaping Use  . Vaping Use: Never used  Substance Use Topics  . Alcohol use: No  . Drug use: No     Colonoscopy:  PAP:  Bone  density:  Lipid panel:  Allergies  Allergen Reactions  . Norco [Hydrocodone-Acetaminophen] Nausea And Vomiting  . Penicillin G     Childhood allergy Has patient had a PCN reaction causing immediate rash, facial/tongue/throat swelling, SOB or lightheadedness with hypotension: Unknown Has patient had a PCN reaction causing severe rash involving mucus membranes or skin necrosis: Unknown Has patient had a PCN reaction that required hospitalization: Unknown Has patient had a PCN reaction occurring within the last 10 years: No If all of the above answers are "NO", then may proceed with Cephalosporin use.     Current Outpatient Medications  Medication Sig Dispense Refill  . DULoxetine (CYMBALTA) 30 MG capsule Take 30 mg by mouth daily. Take with 36m tab    . DULoxetine (CYMBALTA) 60 MG capsule Take 60 mg by mouth daily. Take with 349mtab    . enalapril (VASOTEC) 10 MG tablet Take 10 mg by mouth daily.     . hydrochlorothiazide (HYDRODIURIL) 12.5 MG tablet Take by mouth.    . pantoprazole (PROTONIX) 40 MG tablet Take 40 mg by mouth daily.    . pravastatin (PRAVACHOL) 40 MG tablet Take 40 mg by mouth daily.     . pregabalin (LYRICA) 150 MG capsule Take 150 mg by mouth 2 (two) times daily.     . valACYclovir (VALTREX) 500 MG tablet Take 500 mg by mouth daily.     . chlorthalidone (HYGROTON) 25 MG tablet Take 25 mg by mouth once daily (Patient not taking: Reported on 11/03/2020)  3  . meclizine (ANTIVERT) 25 MG tablet Take 25 mg by mouth twice daily as needed for dizziness (Patient not taking: Reported on 11/03/2020)    . Polyethyl Glycol-Propyl Glycol (SYSTANE OP) Place 1 drop into both eyes daily as needed (dry eyes). (Patient not taking: Reported on 11/03/2020)    . sertraline (ZOLOFT) 100 MG tablet Take 100 mg by mouth daily.  (Patient not taking: Reported on 11/03/2020)  3  . traMADol (ULTRAM) 50 MG tablet Take 1 tablet (50 mg total) by mouth every 6 (six) hours as needed. (Patient not  taking: Reported on 11/03/2020) 20 tablet 0   No current facility-administered medications for this visit.    OBJECTIVE: Vitals:   11/03/20 1118  BP: (!) 154/77  Pulse: 73  Temp: 97.7 F (36.5 C)  SpO2: 100%     Body mass index is 31.79 kg/m.    ECOG FS:0 - Asymptomatic  General: Well-developed, well-nourished, no acute distress. Eyes: Pink conjunctiva, anicteric sclera. HEENT: Normocephalic, moist mucous membranes. Lungs: No audible wheezing or coughing. Heart: Regular rate and rhythm. Abdomen: Soft, nontender, no obvious distention. Musculoskeletal: No edema, cyanosis, or clubbing. Neuro: Alert, answering all questions appropriately. Cranial nerves grossly intact. Skin: No rashes or petechiae noted. Psych: Normal affect. Lymphatics: No cervical, calvicular, axillary or inguinal LAD.   LAB RESULTS:  Lab Results  Component Value Date   NA 139 07/10/2018   K 3.5 07/10/2018   CL 104 07/10/2018   CO2 27 07/10/2018  GLUCOSE 110 (H) 07/10/2018   BUN 16 07/10/2018   CREATININE 1.14 (H) 07/10/2018   CALCIUM 8.0 (L) 07/10/2018   PROT 6.9 07/09/2018   ALBUMIN 3.3 (L) 07/09/2018   AST 32 07/09/2018   ALT 12 07/09/2018   ALKPHOS 51 07/09/2018   BILITOT 0.4 07/09/2018   GFRNONAA 50 (L) 07/10/2018   GFRAA 58 (L) 07/10/2018    Lab Results  Component Value Date   WBC 9.8 07/10/2018   HGB 10.3 (L) 07/10/2018   HCT 30.7 (L) 07/10/2018   MCV 83.9 07/10/2018   PLT 215 07/10/2018     STUDIES: US BREAST LTD UNI RIGHT INC AXILLA  Result Date: 10/19/2020 CLINICAL DATA:  64 year old female recalled from screening mammogram dated 10/04/2020 for a possible right breast mass. Of note, patient has history of bilateral surgical excisions for fibroadenomas. EXAM: DIGITAL DIAGNOSTIC RIGHT MAMMOGRAM WITH CAD AND TOMO ULTRASOUND RIGHT BREAST COMPARISON:  Previous exam(s). ACR Breast Density Category c: The breast tissue is heterogeneously dense, which may obscure small masses.  FINDINGS: There is a persistent area of distortion in the lower inner right breast at mid to posterior depth. This area underlies a scar from prior surgical excision. Comparison to prior studies is limited due to differences in technique. No prior 3D comparisons are available. Mammographic images were processed with CAD. On physical exam, postsurgical scar is noted along the lower inner right breast. Targeted ultrasound is performed, showing postsurgical scarring without focal or suspicious sonographic abnormality. Extensive evaluation of the lower inner right breast was performed. Evaluation of the right axilla demonstrates no suspicious lymphadenopathy. IMPRESSION: 1. Indeterminate right breast focal distortion without sonographic correlate. This underlies an area of scarring from prior surgical excision and may be postoperative in nature. However, comparison with prior studies is limited due to differences in technique. Definitive tissue diagnosis is recommended. 2. No suspicious right axillary lymphadenopathy. RECOMMENDATION: Stereotactic biopsy of the right breast. I have discussed the findings and recommendations with the patient. If applicable, a reminder letter will be sent to the patient regarding the next appointment. BI-RADS CATEGORY  4: Suspicious. Electronically Signed   By: Kristopher Oppenheim M.D.   On: 10/19/2020 11:36   MM DIAG BREAST TOMO UNI RIGHT  Result Date: 10/19/2020 CLINICAL DATA:  64 year old female recalled from screening mammogram dated 10/04/2020 for a possible right breast mass. Of note, patient has history of bilateral surgical excisions for fibroadenomas. EXAM: DIGITAL DIAGNOSTIC RIGHT MAMMOGRAM WITH CAD AND TOMO ULTRASOUND RIGHT BREAST COMPARISON:  Previous exam(s). ACR Breast Density Category c: The breast tissue is heterogeneously dense, which may obscure small masses. FINDINGS: There is a persistent area of distortion in the lower inner right breast at mid to posterior depth. This  area underlies a scar from prior surgical excision. Comparison to prior studies is limited due to differences in technique. No prior 3D comparisons are available. Mammographic images were processed with CAD. On physical exam, postsurgical scar is noted along the lower inner right breast. Targeted ultrasound is performed, showing postsurgical scarring without focal or suspicious sonographic abnormality. Extensive evaluation of the lower inner right breast was performed. Evaluation of the right axilla demonstrates no suspicious lymphadenopathy. IMPRESSION: 1. Indeterminate right breast focal distortion without sonographic correlate. This underlies an area of scarring from prior surgical excision and may be postoperative in nature. However, comparison with prior studies is limited due to differences in technique. Definitive tissue diagnosis is recommended. 2. No suspicious right axillary lymphadenopathy. RECOMMENDATION: Stereotactic biopsy of the right breast. I have  discussed the findings and recommendations with the patient. If applicable, a reminder letter will be sent to the patient regarding the next appointment. BI-RADS CATEGORY  4: Suspicious. Electronically Signed   By: Kristopher Oppenheim M.D.   On: 10/19/2020 11:36   MM CLIP PLACEMENT RIGHT  Result Date: 10/26/2020 CLINICAL DATA:  Post procedure mammogram for clip placement. EXAM: DIAGNOSTIC RIGHT MAMMOGRAM POST STEREOTACTIC BIOPSY COMPARISON:  Previous exam(s). FINDINGS: Mammographic images were obtained following stereotactic guided biopsy of distortion in the lower inner right breast. The X biopsy marking clip is in expected position at the site of biopsy. IMPRESSION: Appropriate positioning of the X shaped biopsy marking clip at the site of biopsy in the lower inner right breast. Final Assessment: Post Procedure Mammograms for Marker Placement Electronically Signed   By: Audie Pinto M.D.   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 This was found to be concordant by Dr. Audie Pinto. Pathology results were discussed with the patient by telephone. The patient reported doing well after the biopsy with tenderness at the site. Post biopsy instructions and care were reviewed and questions were answered. The patient was encouraged to call [facility] for any additional concerns. Tanya Nones RN Oncology Navigator with [facility] notified of results and will assist patient with surgical referral. Breast MRI recommended given lobular features. Pathology results reported by Stacie Acres RN on 10/28/2020. Electronically Signed   By: Audie Pinto M.D.   On: 10/28/2020 13:54   Result Date: 10/31/2020 CLINICAL DATA:  64 year old female presenting for biopsy of right breast distortion. EXAM: RIGHT BREAST STEREOTACTIC CORE NEEDLE BIOPSY COMPARISON:  Previous exams. FINDINGS: The patient and I discussed the procedure of stereotactic-guided biopsy including benefits and alternatives. We discussed the high likelihood of a successful procedure. We discussed the risks of the procedure including infection, bleeding, tissue injury, clip migration, and inadequate sampling. Informed written consent was given. The usual time out protocol was performed immediately prior to the procedure. Using sterile technique and 1% Lidocaine as local anesthetic, under stereotactic guidance, a 9 gauge vacuum assisted device was used to perform core needle biopsy of distortion in the lower inner right breast using a medial approach. Lesion quadrant: Lower inner quadrant At the conclusion of the procedure,  an X tissue marker clip was deployed into the biopsy cavity. Follow-up 2-view mammogram was performed and dictated separately. IMPRESSION: Stereotactic-guided biopsy of distortion in the lower inner right breast. No apparent complications. Electronically Signed: By: Audie Pinto M.D. 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  PLAN:    1. 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  MRI of the breast for further evaluation is scheduled for November 07, 2020.  It is unlikely patient will require chemotherapy, but will wait for final results of MRI to make a determination.  Oncotype DX testing may also be of benefit.  She most likely will undergo lumpectomy followed by adjuvant XRT.  She will also benefit  from an aromatase inhibitor for a minimum of 5 years given the ER/PR status of her tumor.  No follow-up has been scheduled at this time.  Will await MRI results and further surgical evaluation first. 2.  Travel plans: Patient reports that she has a friend undergoing chemotherapy for breast cancer in [place] and she would like to visit her in the near future.  If she has a stage I tumor, she could possibly travel between her surgery and the initiation of XRT.  I spent a total of 60 minutes reviewing chart data, face-to-face evaluation with the patient, counseling and coordination of care as detailed above.   Patient expressed understanding and was in agreement with this plan. She also understands that She can call clinic at any time with any questions, concerns, or complaints.   Cancer Staging No matching staging information was found for the patient.  Lloyd Huger, MD   11/05/2020 10:18 AM

## 2020-11-07 ENCOUNTER — Other Ambulatory Visit: Payer: Self-pay

## 2020-11-07 ENCOUNTER — Ambulatory Visit
Admission: RE | Admit: 2020-11-07 | Discharge: 2020-11-07 | Disposition: A | Discharge status: Home / Self Care | Payer: Medicare Other | Source: Ambulatory Visit | Attending: General Surgery | Admitting: General Surgery

## 2020-11-07 DIAGNOSIS — C50311 Malignant neoplasm of lower-inner quadrant of right female breast: Secondary | ICD-10-CM | POA: Insufficient documentation

## 2020-11-07 MED ORDER — GADOBUTROL 1 MMOL/ML IV SOLN
8.0000 mL | Freq: Once | INTRAVENOUS | Status: AC | PRN
  Administered 2020-11-07: 8 mL via INTRAVENOUS

## 2020-11-09 ENCOUNTER — Other Ambulatory Visit: Payer: Self-pay | Admitting: General Surgery

## 2020-11-09 DIAGNOSIS — C50311 Malignant neoplasm of lower-inner quadrant of right female breast: Secondary | ICD-10-CM

## 2020-11-09 NOTE — Progress Notes (Signed)
Subjective:     Patient ID: Amanda Hardin is a 64 y.o. female.  HPI  The following portions of the patient's history were reviewed and updated as appropriate.  This an established patient is here today for: office visit. The patient is here today to discuss surgery options for a recently diagnosed right breast cancer. Patient had a breast MRI completed since her last office visit.       Chief Complaint  Patient presents with  . Treatment Plan Discussion     Pulse 81   Temp 36.6 C (97.9 F)   Ht 152.4 cm (5')   Wt 73 kg (161 lb)   SpO2 94%   BMI 31.44 kg/m       Past Medical History:  Diagnosis Date  . Arthritis   . Breast fibroadenoma   . Depression   . GAD (generalized anxiety disorder)   . GERD (gastroesophageal reflux disease)   . History of cataract   . History of herpes genitalis   . Mixed hyperlipidemia   . Plantar fasciitis   . Primary hypertension   . Thyroid nodule   . Torn meniscus 2018   right knee  . Trigeminal neuralgia           Past Surgical History:  Procedure Laterality Date  . BIOPSY BREAST W/ LOC DEVICE PLACEMENT AND STEREOTACTIC GUIDANCE Right 10/26/2020  . CESAREAN DELIVERY    . COLONOSCOPY  03/26/2017   FH Colon Polyps (Father): CBF 03/2020  . EGD  03/26/2017   Mild Schatzki Ring; Dilated: No repeat per RTE  . HYSTERECTOMY VAGINAL  07/09/2018   total  . MASTECTOMY PARTIAL / LUMPECTOMY Bilateral   . TONSILLECTOMY    . UNLISTED PROCEDURE BREAST     multiple breast fibroadenomas biopsied and removed (3)              OB History    Gravida  3   Para  1   Term      Preterm      AB      Living        SAB      IAB      Ectopic      Molar      Multiple      Live Births          Obstetric Comments  Age at first period 83 Age of first pregnancy 54        Social History          Socioeconomic History  . Marital status: Single    Spouse name: Not on file  .  Number of children: Not on file  . Years of education: Not on file  . Highest education level: Not on file  Occupational History  . Occupation: retired  Tobacco Use  . Smoking status: Former Smoker    Start date: 1974    Quit date: 2004    Years since quitting: 17.9  . Smokeless tobacco: Never Used  Vaping Use  . Vaping Use: Never used  Substance and Sexual Activity  . Alcohol use: No  . Drug use: No  . Sexual activity: Defer  Other Topics Concern  . Not on file  Social History Narrative  . Not on file   Social Determinants of Health   Financial Resource Strain: Not on file  Food Insecurity: Not on file  Transportation Needs: Not on file           Allergies  Allergen  Reactions  . Hydrocodone-Acetaminophen Nausea And Vomiting  . Penicillin Other (See Comments)    Current Medications        Current Outpatient Medications  Medication Sig Dispense Refill  . cyclobenzaprine (FLEXERIL) 5 MG tablet Take 1 tablet (5 mg total) by mouth nightly as needed 30 tablet 0  . DULoxetine (CYMBALTA) 30 MG DR capsule Take 1 capsule (30 mg total) by mouth once daily 90 capsule 1  . DULoxetine (CYMBALTA) 60 MG DR capsule TAKE 1 CAPSULE(60 MG) BY MOUTH EVERY DAY 90 capsule 1  . enalapril (VASOTEC) 10 MG tablet Take 1 tablet (10 mg total) by mouth once daily 90 tablet 1  . hydroCHLOROthiazide (HYDRODIURIL) 12.5 MG tablet Take 1 tablet (12.5 mg total) by mouth once daily 90 tablet 3  . pantoprazole (PROTONIX) 40 MG DR tablet TAKE 1 TABLET(40 MG) BY MOUTH EVERY DAY 90 tablet 3  . pravastatin (PRAVACHOL) 40 MG tablet TAKE 1 TABLET(40 MG) BY MOUTH EVERY NIGHT 90 tablet 3  . pregabalin (LYRICA) 150 MG capsule TAKE 1 CAPSULE(150 MG) BY MOUTH THREE TIMES DAILY 270 capsule 0  . valACYclovir (VALTREX) 500 MG tablet TAKE 1 TABLET(500 MG) BY MOUTH EVERY DAY 90 tablet 1   No current facility-administered medications for this visit.         Review of Systems  Constitutional:  Negative for chills and fever.  Respiratory: Negative for cough.        Objective:   Physical Exam Constitutional:      Appearance: Normal appearance.  Cardiovascular:     Rate and Rhythm: Normal rate and regular rhythm.  Pulmonary:     Effort: Pulmonary effort is normal.     Breath sounds: Normal breath sounds.  Neurological:     Mental Status: She is alert and oriented to person, place, and time.  Psychiatric:        Mood and Affect: Mood normal.        Behavior: Behavior normal.    Labs and Radiology:   Breast MRI November 07, 2020:  IMPRESSION: 1. 1.6 cm enhancing mass with associated post biopsy changes in the lower inner right breast, consistent with the patient's biopsy-proven site of malignancy. 2. No additional dominant mass or suspicious enhancement in the remainder of the right breast. 3. No MRI evidence of malignancy on the left. 4. No suspicious lymphadenopathy.   Breast ultrasound November 09, 2020:  The biopsy site shows significant distortion in the previously placed "X" clip is not clearly evident.  Review of the October 26, 2020 post biopsy mammograms showed the clip lateral to and an air bubble medial to the area of distortion initially of concern on screening mammogram.  Wire localization will be required prior to surgery.     Assessment:     Stage I carcinoma of the right breast.  Desire for breast conservation.    Plan:     Options for management of the breast cancer were reviewed.  The patient will be best served by breast conservation considering her very large breast volume.  She will likely be a candidate for accelerated partial breast radiation which may be better tolerated again because of her large breast size.  Based on her age she is certainly a candidate for sentinel node biopsy and postoperative radiation.  Final recommendations for adjuvant treatment will depend on final tumor size (6 mm on biopsy, 16 mm on MRI) as  well as nodal status.   Patient to follow up as scheduled and is  aware to call for any new issues or concerns.     Entered by Ledell Noss, CMA, acting as a scribe for Dr. Hervey Ard, MD.   The documentation recorded by the scribe accurately reflects the service I personally performed and the decisions made by me.   Robert Bellow, MD FACS

## 2020-11-14 ENCOUNTER — Other Ambulatory Visit: Payer: Self-pay | Admitting: General Surgery

## 2020-11-14 DIAGNOSIS — C50311 Malignant neoplasm of lower-inner quadrant of right female breast: Secondary | ICD-10-CM

## 2020-11-17 ENCOUNTER — Encounter: Payer: Self-pay | Admitting: *Deleted

## 2020-11-21 ENCOUNTER — Ambulatory Visit: Payer: Medicare Other

## 2020-11-28 ENCOUNTER — Other Ambulatory Visit
Admission: RE | Admit: 2020-11-28 | Discharge: 2020-11-28 | Disposition: A | Discharge status: Home / Self Care | Payer: Medicare Other | Source: Ambulatory Visit | Attending: General Surgery | Admitting: General Surgery

## 2020-11-28 ENCOUNTER — Other Ambulatory Visit: Payer: Self-pay

## 2020-11-28 HISTORY — DX: Other complications of anesthesia, initial encounter: T88.59XA

## 2020-11-28 NOTE — Patient Instructions (Signed)
Your procedure is scheduled on: Wednesday 12/07/20.  Arrive at the [facility] 7:45 am.   Your guest will drop you off there. She is welcome to enter through the [facility] entrance and go to the Day Surgery waiting area on the 2nd floor.   Remember: Instructions that are not followed completely may result in serious medical risk, up to and including death, or upon the discretion of your surgeon and anesthesiologist your surgery may need to be rescheduled.     __X__ 1. Do not eat food after midnight the night before your procedure.                 No gum chewing or hard candies. You may drink clear liquids up to 2 hours                 before you are scheduled to arrive for your surgery- DO NOT drink clear                 liquids within 2 hours of the start of your surgery.                 Clear Liquids include:  water, apple juice without pulp, clear carbohydrate                 drink such as Clearfast or Gatorade, Black Coffee or Tea (Do not add                 milk or creamer to coffee or tea).   __X__2.  On the morning of surgery brush your teeth with toothpaste and water, you may rinse your mouth with mouthwash if you wish.  Do not swallow any toothpaste or mouthwash.    __X__ 3.  No Alcohol for 24 hours before or after surgery.  __X__ 4.  Do Not Smoke or use e-cigarettes For 24 Hours Prior to Your Surgery.                 Do not use any chewable tobacco products for at least 6 hours prior to                 surgery.  __X__5.  Notify your doctor if there is any change in your medical condition      (cold, fever, infections).      Do NOT wear jewelry, make-up, hairpins, clips or nail polish. Do NOT wear lotions, powders, or perfumes.  Do NOT shave 48 hours prior to surgery. Men may shave face and neck. Do NOT bring valuables to the hospital.     [facility] is not responsible for any belongings or valuables.   Contacts, dentures/partials or body piercings may  not be worn into surgery. Bring a case for your contacts, glasses or hearing aids, a denture cup will be supplied.   Patients discharged the day of surgery will not be allowed to drive home.     __X__ Take these medicines the morning of surgery with A SIP OF WATER:     1. DULoxetine (CYMBALTA)  2. pantoprazole (PROTONIX)  3. pregabalin (LYRICA)   4. valACYclovir (VALTREX)  5. meclizine (ANTIVERT) if needed   __X__ Use CHG Soap as directed.  __X__ Stop Anti-inflammatories 7 days before surgery such as Advil, Ibuprofen, Motrin, BC or Goodies Powder, Naprosyn, Naproxen, Aleve, Aspirin, Meloxicam. May take Tylenol if needed for pain or discomfort.   __X__Do not start taking any new herbal supplements or vitamins prior to  your procedure.   Wear comfortable clothing (specific to your surgery type) to the hospital.  Plan for stool softeners for home use; pain medications have a tendency to cause constipation. You can also help prevent constipation by eating foods high in fiber such as fruits and vegetables and drinking plenty of fluids as your diet allows.  After surgery, you can prevent lung complications by doing breathing exercises.Take deep breaths and cough every 1-2 hours. Your doctor may order a device called an Incentive Spirometer to help you take deep breaths.  Please call the [place] Department at [phone] if you have any questions about these instructions.

## 2020-11-29 ENCOUNTER — Encounter
Admission: RE | Admit: 2020-11-29 | Discharge: 2020-11-29 | Disposition: A | Discharge status: Home / Self Care | Payer: Medicare Other | Source: Ambulatory Visit | Attending: General Surgery | Admitting: General Surgery

## 2020-11-29 DIAGNOSIS — I10 Essential (primary) hypertension: Secondary | ICD-10-CM | POA: Diagnosis not present

## 2020-11-29 DIAGNOSIS — C50311 Malignant neoplasm of lower-inner quadrant of right female breast: Secondary | ICD-10-CM | POA: Diagnosis not present

## 2020-11-29 DIAGNOSIS — Z01812 Encounter for preprocedural laboratory examination: Secondary | ICD-10-CM | POA: Insufficient documentation

## 2020-11-29 DIAGNOSIS — Z880 Allergy status to penicillin: Secondary | ICD-10-CM | POA: Diagnosis not present

## 2020-11-29 DIAGNOSIS — Z79899 Other long term (current) drug therapy: Secondary | ICD-10-CM | POA: Diagnosis not present

## 2020-11-29 DIAGNOSIS — Z170 Estrogen receptor positive status [ER+]: Secondary | ICD-10-CM | POA: Diagnosis not present

## 2020-11-29 DIAGNOSIS — Z86018 Personal history of other benign neoplasm: Secondary | ICD-10-CM | POA: Diagnosis not present

## 2020-11-29 DIAGNOSIS — Z885 Allergy status to narcotic agent status: Secondary | ICD-10-CM | POA: Diagnosis not present

## 2020-11-29 DIAGNOSIS — Z91048 Other nonmedicinal substance allergy status: Secondary | ICD-10-CM | POA: Diagnosis not present

## 2020-11-29 DIAGNOSIS — Z87891 Personal history of nicotine dependence: Secondary | ICD-10-CM | POA: Diagnosis not present

## 2020-11-29 LAB — BASIC METABOLIC PANEL
Anion gap: 9 (ref 5–15)
BUN: 15 mg/dL (ref 8–23)
CO2: 30 mmol/L (ref 22–32)
Calcium: 9 mg/dL (ref 8.9–10.3)
Chloride: 102 mmol/L (ref 98–111)
Creatinine, Ser: 0.95 mg/dL (ref 0.44–1.00)
GFR, Estimated: >60 mL/min (ref >60)
Glucose, Bld: 106 mg/dL — ABNORMAL HIGH (ref 70–99)
Potassium: 3.2 mmol/L — ABNORMAL LOW (ref 3.5–5.1)
Sodium: 141 mmol/L (ref 135–145)

## 2020-11-29 LAB — CBC
HCT: 37.9 % (ref 36.0–46.0)
Hemoglobin: 12.4 g/dL (ref 12.0–15.0)
MCH: 27.3 pg (ref 26.0–34.0)
MCHC: 32.7 g/dL (ref 30.0–36.0)
MCV: 83.3 fL (ref 80.0–100.0)
Platelets: 280 10*3/uL (ref 150–400)
RBC: 4.55 MIL/uL (ref 3.87–5.11)
RDW: 15 % (ref 11.5–15.5)
WBC: 6.5 10*3/uL (ref 4.0–10.5)
nRBC: 0 % (ref 0.0–0.2)

## 2020-12-05 ENCOUNTER — Other Ambulatory Visit
Admission: RE | Admit: 2020-12-05 | Discharge: 2020-12-05 | Disposition: A | Discharge status: Home / Self Care | Payer: Medicare Other | Source: Ambulatory Visit | Attending: General Surgery | Admitting: General Surgery

## 2020-12-05 ENCOUNTER — Other Ambulatory Visit: Payer: Self-pay

## 2020-12-05 DIAGNOSIS — Z20822 Contact with and (suspected) exposure to covid-19: Secondary | ICD-10-CM | POA: Insufficient documentation

## 2020-12-05 DIAGNOSIS — Z01812 Encounter for preprocedural laboratory examination: Secondary | ICD-10-CM | POA: Diagnosis present

## 2020-12-05 LAB — SARS CORONAVIRUS 2 (TAT 6-24 HRS): SARS Coronavirus 2: NEGATIVE

## 2020-12-07 ENCOUNTER — Encounter
Admission: RE | Disposition: A | Discharge status: Home / Self Care | Payer: Self-pay | Source: Home / Self Care | Attending: General Surgery

## 2020-12-07 ENCOUNTER — Ambulatory Visit
Admission: RE | Admit: 2020-12-07 | Discharge: 2020-12-07 | Disposition: A | Discharge status: Home / Self Care | Payer: Medicare Other | Source: Ambulatory Visit | Attending: General Surgery | Admitting: General Surgery

## 2020-12-07 ENCOUNTER — Encounter
Admission: RE | Admit: 2020-12-07 | Discharge: 2020-12-07 | Disposition: A | Discharge status: Home / Self Care | Payer: Medicare Other | Source: Ambulatory Visit | Attending: General Surgery | Admitting: General Surgery

## 2020-12-07 ENCOUNTER — Other Ambulatory Visit: Payer: Self-pay

## 2020-12-07 ENCOUNTER — Ambulatory Visit: Payer: Medicare Other | Admitting: Urgent Care

## 2020-12-07 ENCOUNTER — Ambulatory Visit
Admission: RE | Admit: 2020-12-07 | Discharge: 2020-12-07 | Disposition: A | Discharge status: Home / Self Care | Payer: Medicare Other | Attending: General Surgery | Admitting: General Surgery

## 2020-12-07 ENCOUNTER — Encounter: Payer: Self-pay | Admitting: General Surgery

## 2020-12-07 DIAGNOSIS — Z91048 Other nonmedicinal substance allergy status: Secondary | ICD-10-CM | POA: Insufficient documentation

## 2020-12-07 DIAGNOSIS — Z170 Estrogen receptor positive status [ER+]: Secondary | ICD-10-CM | POA: Insufficient documentation

## 2020-12-07 DIAGNOSIS — C50311 Malignant neoplasm of lower-inner quadrant of right female breast: Secondary | ICD-10-CM | POA: Diagnosis not present

## 2020-12-07 DIAGNOSIS — Z885 Allergy status to narcotic agent status: Secondary | ICD-10-CM | POA: Insufficient documentation

## 2020-12-07 DIAGNOSIS — Z86018 Personal history of other benign neoplasm: Secondary | ICD-10-CM | POA: Insufficient documentation

## 2020-12-07 DIAGNOSIS — Z79899 Other long term (current) drug therapy: Secondary | ICD-10-CM | POA: Insufficient documentation

## 2020-12-07 DIAGNOSIS — I10 Essential (primary) hypertension: Secondary | ICD-10-CM | POA: Insufficient documentation

## 2020-12-07 DIAGNOSIS — Z880 Allergy status to penicillin: Secondary | ICD-10-CM | POA: Insufficient documentation

## 2020-12-07 DIAGNOSIS — Z87891 Personal history of nicotine dependence: Secondary | ICD-10-CM | POA: Insufficient documentation

## 2020-12-07 HISTORY — PX: BREAST LUMPECTOMY: SHX2

## 2020-12-07 HISTORY — PX: BREAST LUMPECTOMY WITH NEEDLE LOCALIZATION AND AXILLARY SENTINEL LYMPH NODE BX: SHX5760

## 2020-12-07 SURGERY — BREAST LUMPECTOMY WITH NEEDLE LOCALIZATION AND AXILLARY SENTINEL LYMPH NODE BX
Anesthesia: General | Laterality: Right

## 2020-12-07 MED ORDER — PROPOFOL 10 MG/ML IV BOLUS
INTRAVENOUS | Status: DC | PRN
  Administered 2020-12-07: 80 mg via INTRAVENOUS
  Administered 2020-12-07: 150 mg via INTRAVENOUS

## 2020-12-07 MED ORDER — MIDAZOLAM HCL 2 MG/2ML IJ SOLN
INTRAMUSCULAR | Status: DC | PRN
  Administered 2020-12-07 (×2): 1 mg via INTRAVENOUS

## 2020-12-07 MED ORDER — TRAMADOL HCL 50 MG PO TABS: 50.0000 mg | ORAL_TABLET | ORAL | 0 refills | Status: DC | PRN

## 2020-12-07 MED ORDER — ORAL CARE MOUTH RINSE: 15.0000 mL | Freq: Once | OROMUCOSAL | Status: AC

## 2020-12-07 MED ORDER — METHYLENE BLUE 0.5 % INJ SOLN
INTRAVENOUS | Status: DC | PRN
  Administered 2020-12-07: 5 mL via INTRADERMAL

## 2020-12-07 MED ORDER — SUCCINYLCHOLINE CHLORIDE 20 MG/ML IJ SOLN
INTRAMUSCULAR | Status: DC | PRN
  Administered 2020-12-07: 100 mg via INTRAVENOUS

## 2020-12-07 MED ORDER — VASOPRESSIN 20 UNIT/ML IV SOLN
INTRAVENOUS | Status: DC | PRN
  Administered 2020-12-07: 2 [IU] via INTRAVENOUS

## 2020-12-07 MED ORDER — ACETAMINOPHEN 10 MG/ML IV SOLN
INTRAVENOUS | Status: DC | PRN
  Administered 2020-12-07: 1000 mg via INTRAVENOUS

## 2020-12-07 MED ORDER — GLYCOPYRROLATE 0.2 MG/ML IJ SOLN
INTRAMUSCULAR | Status: AC
  Filled 2020-12-07: qty 3

## 2020-12-07 MED ORDER — MIDAZOLAM HCL 2 MG/2ML IJ SOLN
INTRAMUSCULAR | Status: AC
  Filled 2020-12-07: qty 2

## 2020-12-07 MED ORDER — ONDANSETRON HCL 4 MG/2ML IJ SOLN: 4.0000 mg | Freq: Once | INTRAMUSCULAR | Status: DC | PRN

## 2020-12-07 MED ORDER — ROCURONIUM BROMIDE 10 MG/ML (PF) SYRINGE
PREFILLED_SYRINGE | INTRAVENOUS | Status: AC
  Filled 2020-12-07: qty 20

## 2020-12-07 MED ORDER — CHLORHEXIDINE GLUCONATE 0.12 % MT SOLN
OROMUCOSAL | Status: AC
  Administered 2020-12-07: 15 mL via OROMUCOSAL
  Filled 2020-12-07: qty 15

## 2020-12-07 MED ORDER — TRAMADOL HCL 50 MG PO TABS: 50.0000 mg | ORAL_TABLET | ORAL | Status: DC | PRN

## 2020-12-07 MED ORDER — ROCURONIUM BROMIDE 100 MG/10ML IV SOLN
INTRAVENOUS | Status: DC | PRN
  Administered 2020-12-07: 10 mg via INTRAVENOUS

## 2020-12-07 MED ORDER — TECHNETIUM TC 99M TILMANOCEPT KIT
1.0000 | PACK | Freq: Once | INTRAVENOUS | Status: AC | PRN
  Administered 2020-12-07: 1.08 via INTRADERMAL

## 2020-12-07 MED ORDER — PHENYLEPHRINE HCL (PRESSORS) 10 MG/ML IV SOLN
INTRAVENOUS | Status: AC
  Filled 2020-12-07: qty 1

## 2020-12-07 MED ORDER — ONDANSETRON HCL 4 MG/2ML IJ SOLN
INTRAMUSCULAR | Status: AC
  Filled 2020-12-07: qty 12

## 2020-12-07 MED ORDER — DEXMEDETOMIDINE (PRECEDEX) IN NS 20 MCG/5ML (4 MCG/ML) IV SYRINGE
PREFILLED_SYRINGE | INTRAVENOUS | Status: DC | PRN
  Administered 2020-12-07: 4 ug via INTRAVENOUS

## 2020-12-07 MED ORDER — LIDOCAINE HCL (CARDIAC) PF 100 MG/5ML IV SOSY
PREFILLED_SYRINGE | INTRAVENOUS | Status: DC | PRN
  Administered 2020-12-07: 100 mg via INTRAVENOUS

## 2020-12-07 MED ORDER — BUPIVACAINE-EPINEPHRINE (PF) 0.5% -1:200000 IJ SOLN
INTRAMUSCULAR | Status: DC | PRN
  Administered 2020-12-07: 30 mL

## 2020-12-07 MED ORDER — ACETAMINOPHEN 10 MG/ML IV SOLN
INTRAVENOUS | Status: AC
  Filled 2020-12-07: qty 100

## 2020-12-07 MED ORDER — CHLORHEXIDINE GLUCONATE 0.12 % MT SOLN: 15.0000 mL | Freq: Once | OROMUCOSAL | Status: AC

## 2020-12-07 MED ORDER — TRAMADOL HCL 50 MG PO TABS
ORAL_TABLET | ORAL | Status: AC
  Administered 2020-12-07: 50 mg via ORAL
  Filled 2020-12-07: qty 1

## 2020-12-07 MED ORDER — FENTANYL CITRATE (PF) 100 MCG/2ML IJ SOLN
INTRAMUSCULAR | Status: AC
  Filled 2020-12-07: qty 2

## 2020-12-07 MED ORDER — VASOPRESSIN 20 UNIT/ML IV SOLN
INTRAVENOUS | Status: AC
  Filled 2020-12-07: qty 1

## 2020-12-07 MED ORDER — PHENYLEPHRINE HCL (PRESSORS) 10 MG/ML IV SOLN
INTRAVENOUS | Status: DC | PRN
  Administered 2020-12-07: 100 ug via INTRAVENOUS

## 2020-12-07 MED ORDER — GLYCOPYRROLATE 0.2 MG/ML IJ SOLN
INTRAMUSCULAR | Status: DC | PRN
  Administered 2020-12-07: .2 mg via INTRAVENOUS

## 2020-12-07 MED ORDER — METHYLENE BLUE 0.5 % INJ SOLN
INTRAVENOUS | Status: AC
  Filled 2020-12-07: qty 10

## 2020-12-07 MED ORDER — MEPERIDINE HCL 50 MG/ML IJ SOLN: 6.2500 mg | INTRAMUSCULAR | Status: DC | PRN

## 2020-12-07 MED ORDER — LACTATED RINGERS IV SOLN: INTRAVENOUS | Status: DC

## 2020-12-07 MED ORDER — ONDANSETRON HCL 4 MG/2ML IJ SOLN
INTRAMUSCULAR | Status: DC | PRN
  Administered 2020-12-07 (×2): 4 mg via INTRAVENOUS

## 2020-12-07 MED ORDER — DEXAMETHASONE SODIUM PHOSPHATE 10 MG/ML IJ SOLN
INTRAMUSCULAR | Status: AC
  Filled 2020-12-07: qty 3

## 2020-12-07 MED ORDER — FENTANYL CITRATE (PF) 100 MCG/2ML IJ SOLN
INTRAMUSCULAR | Status: DC | PRN
  Administered 2020-12-07: 25 ug via INTRAVENOUS
  Administered 2020-12-07: 50 ug via INTRAVENOUS
  Administered 2020-12-07: 25 ug via INTRAVENOUS

## 2020-12-07 MED ORDER — SUGAMMADEX SODIUM 200 MG/2ML IV SOLN
INTRAVENOUS | Status: DC | PRN
  Administered 2020-12-07: 100 mg via INTRAVENOUS

## 2020-12-07 MED ORDER — DEXAMETHASONE SODIUM PHOSPHATE 10 MG/ML IJ SOLN
INTRAMUSCULAR | Status: DC | PRN
  Administered 2020-12-07: 10 mg via INTRAVENOUS

## 2020-12-07 MED ORDER — FENTANYL CITRATE (PF) 100 MCG/2ML IJ SOLN
25.0000 ug | INTRAMUSCULAR | Status: DC | PRN
Start: 2020-12-07 — End: 2020-12-07

## 2020-12-07 SURGICAL SUPPLY — 60 items
APL PRP STRL LF DISP 70% ISPRP (MISCELLANEOUS) ×1
APL SKNCLS STERI-STRIP NONHPOA (GAUZE/BANDAGES/DRESSINGS) ×1
BENZOIN TINCTURE PRP APPL 2/3 (GAUZE/BANDAGES/DRESSINGS) ×1 IMPLANT
BINDER BREAST LRG (GAUZE/BANDAGES/DRESSINGS) IMPLANT
BINDER BREAST MEDIUM (GAUZE/BANDAGES/DRESSINGS) IMPLANT
BINDER BREAST XLRG (GAUZE/BANDAGES/DRESSINGS) ×1 IMPLANT
BINDER BREAST XXLRG (GAUZE/BANDAGES/DRESSINGS) IMPLANT
BLADE BOVIE TIP EXT 4 (BLADE) ×1 IMPLANT
BLADE SURG 15 STRL SS SAFETY (BLADE) ×4 IMPLANT
BULB RESERV EVAC DRAIN JP 100C (MISCELLANEOUS) IMPLANT
CHLORAPREP W/TINT 26 (MISCELLANEOUS) ×2 IMPLANT
CNTNR SPEC 2.5X3XGRAD LEK (MISCELLANEOUS) ×1
CONT SPEC 4OZ STER OR WHT (MISCELLANEOUS) ×1
CONT SPEC 4OZ STRL OR WHT (MISCELLANEOUS) ×1
CONTAINER SPEC 2.5X3XGRAD LEK (MISCELLANEOUS) IMPLANT
COVER PROBE FLX POLY STRL (MISCELLANEOUS) ×2 IMPLANT
COVER WAND RF STERILE (DRAPES) ×2 IMPLANT
DEVICE DUBIN SPECIMEN MAMMOGRA (MISCELLANEOUS) ×2 IMPLANT
DRAIN CHANNEL JP 15F RND 16 (MISCELLANEOUS) IMPLANT
DRAPE LAPAROTOMY TRNSV 106X77 (MISCELLANEOUS) ×2 IMPLANT
DRSG GAUZE FLUFF 36X18 (GAUZE/BANDAGES/DRESSINGS) ×3 IMPLANT
DRSG TELFA 3X8 NADH (GAUZE/BANDAGES/DRESSINGS) ×2 IMPLANT
ELECT CAUTERY BLADE TIP 2.5 (TIP) ×2
ELECT REM PT RETURN 9FT ADLT (ELECTROSURGICAL) ×2
ELECTRODE CAUTERY BLDE TIP 2.5 (TIP) ×1 IMPLANT
ELECTRODE REM PT RTRN 9FT ADLT (ELECTROSURGICAL) ×1 IMPLANT
GLOVE BIO SURGEON STRL SZ7.5 (GLOVE) ×3 IMPLANT
GLOVE INDICATOR 8.0 STRL GRN (GLOVE) ×3 IMPLANT
GOWN STRL REUS W/ TWL LRG LVL3 (GOWN DISPOSABLE) ×2 IMPLANT
GOWN STRL REUS W/TWL LRG LVL3 (GOWN DISPOSABLE) ×4
KIT TURNOVER KIT A (KITS) ×2 IMPLANT
LABEL OR SOLS (LABEL) ×2 IMPLANT
MANIFOLD NEPTUNE II (INSTRUMENTS) ×2 IMPLANT
MARGIN MAP 10MM (MISCELLANEOUS) ×2 IMPLANT
NDL HYPO 25X1 1.5 SAFETY (NEEDLE) ×2 IMPLANT
NEEDLE HYPO 22GX1.5 SAFETY (NEEDLE) ×2 IMPLANT
NEEDLE HYPO 25X1 1.5 SAFETY (NEEDLE) ×4 IMPLANT
PACK BASIN MINOR ARMC (MISCELLANEOUS) ×2 IMPLANT
PAD DRESSING TELFA 3X8 NADH (GAUZE/BANDAGES/DRESSINGS) ×1 IMPLANT
RETRACTOR RING XSMALL (MISCELLANEOUS) IMPLANT
RTRCTR WOUND ALEXIS 13CM XS SH (MISCELLANEOUS) ×2
SHEARS FOC LG CVD HARMONIC 17C (MISCELLANEOUS) IMPLANT
SHEARS HARMONIC 9CM CVD (BLADE) IMPLANT
SLEVE PROBE SENORX GAMMA FIND (MISCELLANEOUS) ×1 IMPLANT
STRIP CLOSURE SKIN 1/2X4 (GAUZE/BANDAGES/DRESSINGS) ×3 IMPLANT
SUT ETHILON 3-0 FS-10 30 BLK (SUTURE) ×2
SUT SILK 2 0 (SUTURE) ×2
SUT SILK 2-0 18XBRD TIE 12 (SUTURE) ×1 IMPLANT
SUT VIC AB 2-0 CT1 27 (SUTURE) ×6
SUT VIC AB 2-0 CT1 TAPERPNT 27 (SUTURE) ×2 IMPLANT
SUT VIC AB 3-0 SH 27 (SUTURE) ×4
SUT VIC AB 3-0 SH 27X BRD (SUTURE) ×2 IMPLANT
SUT VIC AB 4-0 FS2 27 (SUTURE) ×5 IMPLANT
SUT VICRYL+ 3-0 144IN (SUTURE) ×2 IMPLANT
SUTURE EHLN 3-0 FS-10 30 BLK (SUTURE) ×1 IMPLANT
SWABSTK COMLB BENZOIN TINCTURE (MISCELLANEOUS) ×2 IMPLANT
SYR 10ML LL (SYRINGE) ×2 IMPLANT
SYR BULB IRRIG 60ML STRL (SYRINGE) ×2 IMPLANT
TAPE TRANSPORE STRL 2 31045 (GAUZE/BANDAGES/DRESSINGS) ×2 IMPLANT
WATER STERILE IRR 1000ML POUR (IV SOLUTION) ×2 IMPLANT

## 2020-12-07 NOTE — Anesthesia Procedure Notes (Signed)
Procedure Name: LMA Insertion Performed by: Kelton Pillar, CRNA Pre-anesthesia Checklist: Patient identified, Emergency Drugs available, Suction available and Patient being monitored Patient Re-evaluated:Patient Re-evaluated prior to induction Oxygen Delivery Method: Simple face mask Preoxygenation: Pre-oxygenation with 100% oxygen Induction Type: IV induction Ventilation: Mask ventilation without difficulty LMA: LMA inserted LMA Size: 4.0 Number of attempts: 1 Placement Confirmation: positive ETCO2,  CO2 detector and breath sounds checked- equal and bilateral Tube secured with: Tape Dental Injury: Teeth and Oropharynx as per pre-operative assessment

## 2020-12-07 NOTE — Transfer of Care (Signed)
Immediate Anesthesia Transfer of Care Note  Patient: Amanda Hardin  Procedure(s) Performed: BREAST LUMPECTOMY WITH NEEDLE LOCALIZATION AND AXILLARY SENTINEL LYMPH NODE BX (Right )  Patient Location: PACU  Anesthesia Type:General  Level of Consciousness: drowsy and patient cooperative  Airway & Oxygen Therapy: Patient Spontanous Breathing and Patient connected to face mask oxygen  Post-op Assessment: Report given to RN and Post -op Vital signs reviewed and stable  Post vital signs: Reviewed and stable  Last Vitals:  Vitals Value Taken Time  BP 146/88 12/07/20 1257  Temp    Pulse 90 12/07/20 1303  Resp 15 12/07/20 1303  SpO2 100 % 12/07/20 1303  Vitals shown include unvalidated device data.  Last Pain:  Vitals:   12/07/20 0920  TempSrc: Temporal  PainSc: 0-No pain         Complications: No complications documented.

## 2020-12-07 NOTE — H&P (Signed)
Amanda Hardin CJ:[id number] 09-20-1956     HPI:  64 y/o woman with recently diagnosed breast cancer.  Desires breast conservation. Tolerated needle localization and SLN injection well.   Medications Prior to Admission  Medication Sig Dispense Refill Last Dose  . DULoxetine (CYMBALTA) 30 MG capsule Take 30 mg by mouth daily. Take with 60mg  tab for a total of 90 mg   12/07/2020 at Unknown time  . DULoxetine (CYMBALTA) 60 MG capsule Take 60 mg by mouth daily. Take with 30mg  tab for a total of 90 mg   12/07/2020 at Unknown time  . enalapril (VASOTEC) 10 MG tablet Take 10 mg by mouth daily.    12/06/2020 at Unknown time  . hydrochlorothiazide (HYDRODIURIL) 12.5 MG tablet Take 12.5 mg by mouth daily.    12/06/2020 at Unknown time  . meclizine (ANTIVERT) 25 MG tablet Take 25 mg by mouth daily as needed for dizziness.    12/07/2020 at Unknown time  . pantoprazole (PROTONIX) 40 MG tablet Take 40 mg by mouth daily.   12/07/2020 at Unknown time  . pravastatin (PRAVACHOL) 40 MG tablet Take 40 mg by mouth at bedtime.    12/06/2020 at Unknown time  . pregabalin (LYRICA) 150 MG capsule Take 150 mg by mouth 2 (two) times daily.    12/07/2020 at Unknown time  . valACYclovir (VALTREX) 500 MG tablet Take 500 mg by mouth daily.    12/06/2020 at Unknown time   Allergies  Allergen Reactions  . Norco [Hydrocodone-Acetaminophen] Nausea And Vomiting  . Penicillin G     Childhood allergy Has patient had a PCN reaction causing immediate rash, facial/tongue/throat swelling, SOB or lightheadedness with hypotension: Unknown Has patient had a PCN reaction causing severe rash involving mucus membranes or skin necrosis: Unknown Has patient had a PCN reaction that required hospitalization: Unknown Has patient had a PCN reaction occurring within the last 10 years: No If all of the above answers are "NO", then may proceed with Cephalosporin use.   . Tape Rash   Past Medical History:  Diagnosis Date  . Anxiety   .  Arthritis   . Complication of anesthesia    Difficulty waking up.  . Cough   . Depression   . GERD (gastroesophageal reflux disease)   . Hyperlipidemia   . Hypertension   . Thyroid nodule   . Torn meniscus    R    Past Surgical History:  Procedure Laterality Date  . BREAST BIOPSY Left 09/2015   [place] core bx  NEG   . BREAST BIOPSY Right    both core and excisional done, multiples for fibroadenomas  . BREAST BIOPSY Right 10/26/2020   stereo biopsy, clip, [facility]  . BREAST EXCISIONAL BIOPSY Left 2014   fibroadenomas  . BREAST LUMPECTOMY Right 12/07/2020   [place]  . CESAREAN SECTION    . CYSTOSCOPY N/A 07/09/2018   Procedure: CYSTOSCOPY;  Surgeon: Will Bonnet, MD;  Location: [facility] ORS;  Service: Gynecology;  Laterality: N/A;  . ROBOTIC ASSISTED LAPAROSCOPIC OVARIAN CYSTECTOMY Bilateral 07/09/2018   Procedure: ROBOTIC ASSISTED LAPAROSCOPIC OVARIAN CYSTECTOMY;  Surgeon: Will Bonnet, MD;  Location: [facility] ORS;  Service: Gynecology;  Laterality: Bilateral;  . ROBOTIC ASSISTED TOTAL HYSTERECTOMY WITH BILATERAL SALPINGO OOPHERECTOMY Bilateral 07/09/2018   Procedure: ROBOTIC ASSISTED TOTAL HYSTERECTOMY WITH BILATERAL SALPINGO OOPHORECTOMY;  Surgeon: Will Bonnet, MD;  Location: [facility] ORS;  Service: Gynecology;  Laterality: Bilateral;  . TONSILLECTOMY     Social History   Socioeconomic History  . Marital status:  Single    Spouse name: Not on file  . Number of children: Not on file  . Years of education: Not on file  . Highest education level: Not on file  Occupational History  . Not on file  Tobacco Use  . Smoking status: Former Smoker    Years: 25.00    Types: Cigarettes    Quit date: 03/19/2002    Years since quitting: 18.7  . Smokeless tobacco: Never Used  Vaping Use  . Vaping Use: Never used  Substance and Sexual Activity  . Alcohol use: No  . Drug use: No  . Sexual activity: Not Currently    Birth control/protection: Post-menopausal  Other Topics Concern  . Not on  file  Social History Narrative  . Not on file   Social Determinants of Health   Financial Resource Strain: Not on file  Food Insecurity: Not on file  Transportation Needs: Not on file  Physical Activity: Not on file  Stress: Not on file  Social Connections: Not on file  Intimate Partner Violence: Not on file   Social History   Social History Narrative  . Not on file     ROS: Negative.     PE: HEENT: Negative. Lungs: Clear. Cardio: RR.   Assessment/Plan:  Proceed with planned right breast wide excision and SLN biopsy.   Forest Gleason [facility] 12/07/2020

## 2020-12-07 NOTE — Op Note (Signed)
Preoperative diagnosis: Invasive mammary carcinoma the right breast, desire for breast conservation.  Postoperative diagnosis: Same.  Operative procedure: Right breast wide excision with ultrasound and wire localization, sentinel node biopsy.  Operating surgeon: Hervey Ard, MD.  Anesthesia: General endotracheal, Marcaine 0.5% with 1: 200,000 units of epinephrine.  Estimated blood loss: Less than 10 cc.  Clinical note: This 64 year old woman was recent identified with invasive lobular carcinoma of the right breast. MRI showed no additional lesions. She desired breast conservation. She has a sizable breast volume with a "H" cup bra. She is felt to be a good candidate for breast conservation and possibly accelerated partial breast radiation.  The patient was injected with Lymphoseek prior to the procedure. Wire localization was completed by the radiology service.  Operative note: The patient initially underwent general anesthesia with LMA. This was then converted to an endotracheal tube for better ventilation. The periareolar skin was cleaned with alcohol and 5 cc of 0.5% methylene blue was injected for assistance with localizing lymph nodes. The breast was then cleansed with ChloraPrep and draped taking care not to dislodge the previously placed localizing wire. The area of the 2-3 o'clock position of the breast was scanned and it was possible to follow the wire down to the area of density corresponding to the mass. The clip was lateral to the actual area of distortion. The area for resection was marked out by ultrasound. Local anesthesia was infiltrated. A radial incision was made at the 2-3 o'clock position. The skin was incised sharply and the remaining dissection completed with electrocautery. The wire was brought into the operative field. Flaps were then elevated superiorly and inferiorly at the level of the breast parenchyma. An extra small Alexis wound protector was placed to allow better  delineation of the tissue to be removed. A 3 x 5 x 4 cm block of tissue was then excised orientated and specimen radiograph confirmed the previously placed clip and intact wire. The specimen was sent to pathology for gross review. During this time attention was then turned to the axilla.  The node seeker device was utilized and increased uptake in the lower third of the axilla was identified. Local anesthesia was infiltrated. A transverse incision was made. The skin and a generous layer of adipose tissue was then divided. The Alexis wound protector was moved to the axilla and with the help of a wheat Lander retractor the area of increased uptake was identified. No blue nodes were seen. For hot nodes with counts of 6000 down to about 800 were removed. After these were removed from the field counts were below 200. Hemostasis was with 3-0 Vicryl ties electrocautery. The wound was closed in multiple layers with 2-0 Vicryl figure-of-eight sutures. The skin was closed with a running 4-0 Vicryl subcuticular suture.  The breast parenchyma fell nicely together in spite of the volume removed. This was closed in multiple layers with 2-0 Vicryl figure-of-eight sutures. The skin was closed with a running 4-0 Vicryl subcuticular suture. Benzoin and Steri-Strips followed by Telfa, fluff gauze and a compressive wrap were applied.  The patient tolerated the procedure well and was taken recovery in stable condition.

## 2020-12-07 NOTE — Discharge Instructions (Signed)

## 2020-12-07 NOTE — Anesthesia Postprocedure Evaluation (Signed)
Anesthesia Post Note  Patient: Amanda Hardin  Procedure(s) Performed: BREAST LUMPECTOMY WITH NEEDLE LOCALIZATION AND AXILLARY SENTINEL LYMPH NODE BX (Right )  Patient location during evaluation: PACU Anesthesia Type: General Level of consciousness: awake and alert, awake and oriented Pain management: pain level controlled Vital Signs Assessment: post-procedure vital signs reviewed and stable Respiratory status: spontaneous breathing, nonlabored ventilation and respiratory function stable Cardiovascular status: blood pressure returned to baseline Postop Assessment: no apparent nausea or vomiting Anesthetic complications: no   No complications documented.   Last Vitals:  Vitals:   12/07/20 1426 12/07/20 1530  BP: (!) 147/68 (!) 148/66  Pulse: 88 82  Resp: 16 16  Temp: (!) 36.1 C   SpO2: 93% 96%    Last Pain:  Vitals:   12/07/20 1530  TempSrc:   PainSc: 4                  Phill Mutter

## 2020-12-07 NOTE — Anesthesia Preprocedure Evaluation (Signed)
Anesthesia Evaluation  Patient identified by MRN, date of birth, ID band Patient awake    Reviewed: Allergy & Precautions, H&P , NPO status , Patient's Chart, lab work & pertinent test results  History of Anesthesia Complications Negative for: history of anesthetic complications  Airway Mallampati: III  TM Distance: >3 FB Neck ROM: full    Dental  (+) Chipped, Poor Dentition, Missing, Partial Upper   Pulmonary neg pulmonary ROS, neg shortness of breath, former smoker,    Pulmonary exam normal        Cardiovascular Exercise Tolerance: Good hypertension, (-) angina(-) Past MI and (-) DOE Normal cardiovascular exam     Neuro/Psych PSYCHIATRIC DISORDERS Anxiety Depression  Neuromuscular disease    GI/Hepatic Neg liver ROS, GERD  Medicated and Controlled,  Endo/Other  negative endocrine ROS  Renal/GU      Musculoskeletal  (+) Arthritis , Osteoarthritis,    Abdominal   Peds  Hematology negative hematology ROS (+)   Anesthesia Other Findings Past Medical History: No date: Anxiety No date: Arthritis No date: Cough No date: Depression No date: GERD (gastroesophageal reflux disease) No date: Hyperlipidemia No date: Hypertension No date: Thyroid nodule No date: Torn meniscus     Comment:  R   Past Surgical History: 09/2015: BREAST BIOPSY; Left     Comment:  [place] core bx  NEG  No date: BREAST BIOPSY; Right     Comment:  both core and excisional done, multiples 2014: BREAST BIOPSY; Left No date: CESAREAN SECTION No date: TONSILLECTOMY  BMI    Body Mass Index:  30.66 kg/m      Reproductive/Obstetrics negative OB ROS                             Anesthesia Physical  Anesthesia Plan  ASA: III  Anesthesia Plan: General   Post-op Pain Management:    Induction: Intravenous  PONV Risk Score and Plan: Ondansetron, Dexamethasone and Midazolam  Airway Management Planned:  LMA  Additional Equipment:   Intra-op Plan:   Post-operative Plan: Extubation in OR  Informed Consent: I have reviewed the patients History and Physical, chart, labs and discussed the procedure including the risks, benefits and alternatives for the proposed anesthesia with the patient or authorized representative who has indicated his/her understanding and acceptance.     Dental Advisory Given  Plan Discussed with: Anesthesiologist, CRNA and Surgeon  Anesthesia Plan Comments: (Patient consented for risks of anesthesia including but not limited to:  - adverse reactions to medications - damage to teeth, lips or other oral mucosa - sore throat or hoarseness - Damage to heart, brain, lungs or loss of life  Patient voiced understanding.)        Anesthesia Quick Evaluation

## 2020-12-07 NOTE — Anesthesia Procedure Notes (Signed)
Procedure Name: Intubation Performed by: Fletcher-Harrison, Daziah Hesler, CRNA Pre-anesthesia Checklist: Patient identified, Emergency Drugs available, Suction available and Patient being monitored Patient Re-evaluated:Patient Re-evaluated prior to induction Oxygen Delivery Method: Circle system utilized Preoxygenation: Pre-oxygenation with 100% oxygen Induction Type: IV induction Ventilation: Mask ventilation without difficulty Laryngoscope Size: McGraph and 3 Grade View: Grade I Tube type: Oral Number of attempts: 1 Airway Equipment and Method: Stylet and Oral airway Placement Confirmation: ETT inserted through vocal cords under direct vision,  positive ETCO2,  breath sounds checked- equal and bilateral and CO2 detector Secured at: 21 cm Tube secured with: Tape Dental Injury: Teeth and Oropharynx as per pre-operative assessment        

## 2020-12-08 ENCOUNTER — Encounter: Payer: Self-pay | Admitting: General Surgery

## 2020-12-13 LAB — SURGICAL PATHOLOGY

## 2020-12-18 NOTE — Progress Notes (Unsigned)
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  INTERVAL HISTORY: Patient returns a clinical today postoperatively to discuss her final pathology results and treatment planning.  She tolerated her surgery well without significant side effects.  She has some mild tenderness at her surgical site, but otherwise feels well. She has no neurologic complaints.  She denies any recent fevers or illnesses.  She has a good appetite and denies weight loss.  She has no chest pain, shortness of breath, cough, or hemoptysis.  She denies any nausea, vomiting, constipation, or diarrhea.  She has no urinary complaints.  Patient offers no further specific complaints today.  REVIEW OF SYSTEMS:   Review of Systems  Constitutional: Negative.  Negative for fever, malaise/fatigue and weight loss.  Respiratory: Negative.  Negative for cough, hemoptysis and shortness of breath.   Cardiovascular: Negative.  Negative for chest pain and leg swelling.  Gastrointestinal: Negative.  Negative for abdominal pain.  Genitourinary: Negative.  Negative for dysuria.  Musculoskeletal: Negative.  Negative for back pain.  Skin: Negative.  Negative for rash.  Neurological: Negative.  Negative for dizziness, focal weakness, weakness and headaches.  Psychiatric/Behavioral: Negative.  The patient is not nervous/anxious.     As per HPI. Otherwise, a complete review of systems is negative.  PAST MEDICAL HISTORY: Past Medical History:  Diagnosis Date  . Anxiety   . Arthritis   . Complication of anesthesia     Difficulty waking up.  . Cough   . Depression   . GERD (gastroesophageal reflux disease)   . Hyperlipidemia   . Hypertension   . Thyroid nodule   . Torn meniscus    R     PAST SURGICAL HISTORY: Past Surgical History:  Procedure Laterality Date  . BREAST BIOPSY Left 09/2015   [place] core bx  NEG   . BREAST BIOPSY Right    both core and excisional done, multiples for fibroadenomas  . BREAST BIOPSY Right 10/26/2020   stereo biopsy, clip, [facility]  . BREAST EXCISIONAL BIOPSY Left 2014   fibroadenomas  . BREAST LUMPECTOMY Right 12/07/2020   [place]  . BREAST LUMPECTOMY WITH NEEDLE LOCALIZATION AND AXILLARY SENTINEL LYMPH NODE BX Right 12/07/2020   Procedure: BREAST LUMPECTOMY WITH NEEDLE LOCALIZATION AND AXILLARY SENTINEL LYMPH NODE BX;  Surgeon: Robert Bellow, MD;  Location: [facility];  Service: General;  Laterality: Right;  . CESAREAN SECTION    . CYSTOSCOPY N/A 07/09/2018   Procedure: CYSTOSCOPY;  Surgeon: Will Bonnet, MD;  Location: [facility];  Service: Gynecology;  Laterality: N/A;  . ROBOTIC ASSISTED LAPAROSCOPIC OVARIAN CYSTECTOMY Bilateral 07/09/2018   Procedure: ROBOTIC ASSISTED LAPAROSCOPIC OVARIAN CYSTECTOMY;  Surgeon: Will Bonnet, MD;  Location: [facility];  Service: Gynecology;  Laterality: Bilateral;  . ROBOTIC ASSISTED TOTAL HYSTERECTOMY WITH BILATERAL SALPINGO OOPHERECTOMY Bilateral 07/09/2018   Procedure: ROBOTIC ASSISTED TOTAL HYSTERECTOMY WITH BILATERAL SALPINGO OOPHORECTOMY;  Surgeon: Will Bonnet, MD;  Location: [facility];  Service: Gynecology;  Laterality: Bilateral;  . TONSILLECTOMY      FAMILY HISTORY: Family History  Problem Relation Age of Onset  . Diabetes Maternal Aunt   . Hypertension Maternal  Aunt   . Prostate cancer Father   . Heart failure Father   . Pancreatic cancer Paternal Aunt   . Breast cancer Neg Hx   . Bladder Cancer Neg Hx   . Kidney cancer Neg Hx     ADVANCED DIRECTIVES (Y/N):  N  HEALTH MAINTENANCE: Social History    Tobacco Use  . Smoking status: Former Smoker    Years: 25.00    Types: Cigarettes    Quit date: 03/19/2002    Years since quitting: 18.7  . Smokeless tobacco: Never Used  Vaping Use  . Vaping Use: Never used  Substance Use Topics  . Alcohol use: No  . Drug use: No     Colonoscopy:  PAP:  Bone density:  Lipid panel:  Allergies  Allergen Reactions  . Norco [Hydrocodone-Acetaminophen] Nausea And Vomiting  . Penicillin G     Childhood allergy Has patient had a PCN reaction causing immediate rash, facial/tongue/throat swelling, SOB or lightheadedness with hypotension: Unknown Has patient had a PCN reaction causing severe rash involving mucus membranes or skin necrosis: Unknown Has patient had a PCN reaction that required hospitalization: Unknown Has patient had a PCN reaction occurring within the last 10 years: No If all of the above answers are "NO", then may proceed with Cephalosporin use.   . Tape Rash    Current Outpatient Medications  Medication Sig Dispense Refill  . DULoxetine (CYMBALTA) 30 MG capsule Take 30 mg by mouth daily. Take with 12m tab for a total of 90 mg    . enalapril (VASOTEC) 10 MG tablet Take 10 mg by mouth daily.     . hydrochlorothiazide (HYDRODIURIL) 12.5 MG tablet Take 12.5 mg by mouth daily.     . meclizine (ANTIVERT) 25 MG tablet Take 25 mg by mouth daily as needed for dizziness.     . pantoprazole (PROTONIX) 40 MG tablet Take 40 mg by mouth daily.    . pravastatin (PRAVACHOL) 40 MG tablet Take 40 mg by mouth at bedtime.     . pregabalin (LYRICA) 150 MG capsule Take 150 mg by mouth 2 (two) times daily.     . valACYclovir (VALTREX) 500 MG tablet Take 500 mg by mouth daily.     . DULoxetine (CYMBALTA) 60 MG capsule Take 60 mg by mouth daily. Take with 320mtab for a total of 90 mg    . traMADol (ULTRAM) 50 MG tablet Take 1 tablet (50 mg total) by mouth every 4 (four) hours as needed. (Patient not taking: Reported on 12/21/2020) 20 tablet 0   No  current facility-administered medications for this visit.    OBJECTIVE: Vitals:   12/21/20 1041  BP: 126/69  Pulse: 72  Resp: 20  Temp: (!) 96.4 F (35.8 C)     Body mass index is 31.05 kg/m.    ECOG FS:0 - Asymptomatic  General: Well-developed, well-nourished, no acute distress. Eyes: Pink conjunctiva, anicteric sclera. HEENT: Normocephalic, moist mucous membranes. Breast: Exam deferred today. Lungs: No audible wheezing or coughing. Heart: Regular rate and rhythm. Abdomen: Soft, nontender, no obvious distention. Musculoskeletal: No edema, cyanosis, or clubbing. Neuro: Alert, answering all questions appropriately. Cranial nerves grossly intact. Skin: No rashes or petechiae noted. Psych: Normal affect.   LAB RESULTS:  Lab Results  Component Value Date   NA 141 11/29/2020   K 3.2 (L) 11/29/2020   CL 102 11/29/2020   CO2 30 11/29/2020   GLUCOSE 106 (H) 11/29/2020   BUN 15 11/29/2020   CREATININE 0.95  11/29/2020   CALCIUM 9.0 11/29/2020   PROT 6.9 07/09/2018   ALBUMIN 3.3 (L) 07/09/2018   AST 32 07/09/2018   ALT 12 07/09/2018   ALKPHOS 51 07/09/2018   BILITOT 0.4 07/09/2018   GFRNONAA >60 11/29/2020   GFRAA 58 (L) 07/10/2018    Lab Results  Component Value Date   WBC 6.5 11/29/2020   HGB 12.4 11/29/2020   HCT 37.9 11/29/2020   MCV 83.3 11/29/2020   PLT 280 11/29/2020     STUDIES: NM Sentinel Node Inj-No Rpt (Breast)  Result Date: 12/07/2020 Sulfur colloid was injected by the nuclear medicine technologist for melanoma sentinel node.   MM Breast Surgical Specimen  Result Date: 12/07/2020 CLINICAL DATA:  Post lumpectomy specimen radiograph. EXAM: SPECIMEN RADIOGRAPH OF THE RIGHT BREAST COMPARISON:  Previous exam(s). FINDINGS: Status post excision of the right breast. The wire tip and X biopsy marker clip are present and are marked for pathology. IMPRESSION: Specimen radiograph of the right breast. Electronically Signed   By: Audie Pinto M.D.   On:  12/07/2020 12:07   MM RT PLC BREAST LOC DEV   1ST LESION  INC MAMMO GUIDE  Result Date: 12/07/2020 CLINICAL DATA:  66 year old female with recently diagnosed invasive right breast cancer. Patient presents for localization prior to same day surgical excision. EXAM: NEEDLE LOCALIZATION OF THE RIGHT BREAST WITH MAMMO GUIDANCE COMPARISON:  Previous exams. PROCEDURE: Patient presents for needle localization prior to right breast lumpectomy. I met with the patient and we discussed the procedure of needle localization including benefits and alternatives. We discussed the high likelihood of a successful procedure. We discussed the risks of the procedure, including infection, bleeding, tissue injury, and further surgery. Informed, written consent was given. The usual time-out protocol was performed immediately prior to the procedure. Using mammographic guidance, sterile technique, 1% lidocaine and a 7 cm modified Kopans needle, the mass and X shaped marking clip in the lower inner right breast were localized using a medial approach. The images were marked for Dr. Bary Castilla. IMPRESSION: Needle localization of the right breast. No apparent complications. Electronically Signed   By: Audie Pinto M.D.   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  PLAN:    1. pathologic stage Ia ER/PR positive, HER-2 negative invasive carcinoma of the right lower inner quadrant breast: Final pathology results reviewed independently.  Oncotype DX testing has been ordered and is pending at time of dictation to determine whether adjuvant chemotherapy is necessary.  Because patient underwent lumpectomy, a referral has been made to radiation oncology for consideration of adjuvant XRT.  At the inclusion of all her treatments, patient would benefit from an aromatase inhibitor for a minimum of 5 years.  Return to clinic in 2 weeks for  further evaluation and additional treatment planning.   2.  Travel plans: Patient reports that she has a friend undergoing chemotherapy for breast cancer in [place].  She is unable to make this trip due to the [place] pandemic, with plans to reschedule in the future.   I spent a total of 30 minutes reviewing chart data, face-to-face evaluation with the patient, counseling and coordination of care as detailed above.   Patient expressed understanding and was in agreement with this plan. She also understands that She can call clinic at any time with any questions, concerns, or complaints.   Cancer Staging Carcinoma of lower-inner quadrant of right breast ([place])  Staging form: Breast, AJCC 8th Edition - Pathologic stage from 12/23/2020: Stage IA (pT1c, pN0, cM0, G2, ER+, PR+, HER2-) - Signed by Lloyd Huger, MD on 12/23/2020   Lloyd Huger, MD   12/23/2020 6:33 AM

## 2020-12-21 ENCOUNTER — Inpatient Hospital Stay: Payer: Medicare Other | Attending: Oncology | Admitting: Oncology

## 2020-12-21 ENCOUNTER — Other Ambulatory Visit: Payer: Self-pay

## 2020-12-21 ENCOUNTER — Encounter: Payer: Self-pay | Admitting: Oncology

## 2020-12-21 VITALS — BP 126/69 | HR 72 | Temp 96.4°F | Resp 20 | Wt 159.0 lb

## 2020-12-21 DIAGNOSIS — F32A Depression, unspecified: Secondary | ICD-10-CM | POA: Diagnosis not present

## 2020-12-21 DIAGNOSIS — C50311 Malignant neoplasm of lower-inner quadrant of right female breast: Secondary | ICD-10-CM | POA: Diagnosis not present

## 2020-12-21 DIAGNOSIS — I10 Essential (primary) hypertension: Secondary | ICD-10-CM | POA: Diagnosis not present

## 2020-12-21 DIAGNOSIS — Z79899 Other long term (current) drug therapy: Secondary | ICD-10-CM | POA: Insufficient documentation

## 2020-12-21 DIAGNOSIS — Z803 Family history of malignant neoplasm of breast: Secondary | ICD-10-CM | POA: Insufficient documentation

## 2020-12-21 DIAGNOSIS — Z87891 Personal history of nicotine dependence: Secondary | ICD-10-CM | POA: Diagnosis not present

## 2020-12-21 DIAGNOSIS — F419 Anxiety disorder, unspecified: Secondary | ICD-10-CM | POA: Diagnosis not present

## 2020-12-21 DIAGNOSIS — Z170 Estrogen receptor positive status [ER+]: Secondary | ICD-10-CM | POA: Diagnosis not present

## 2020-12-21 NOTE — Progress Notes (Unsigned)
Patient denies any concerns today.  

## 2020-12-31 NOTE — Progress Notes (Signed)
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  Oncotype Dx score of 13 which is low risk.  INTERVAL HISTORY: Patient returns to clinic today for further evaluation and treatment planning.  She saw radiation oncology earlier this morning for discussion of adjuvant XRT.  She currently feels well and is asymptomatic.  She has no neurologic complaints.  She denies any recent fevers or illnesses.  She has a good appetite and denies weight loss.  She has no chest pain, shortness of breath, cough, or hemoptysis.  She denies any nausea, vomiting, constipation, or diarrhea.  She has no urinary complaints.  Patient offers no specific complaints today.  REVIEW OF SYSTEMS:   Review of Systems  Constitutional: Negative.  Negative for fever, malaise/fatigue and weight loss.  Respiratory: Negative.  Negative for cough, hemoptysis and shortness of breath.   Cardiovascular: Negative.  Negative for chest pain and leg swelling.  Gastrointestinal: Negative.  Negative for abdominal pain.  Genitourinary: Negative.  Negative for dysuria.  Musculoskeletal: Negative.  Negative for back pain.  Skin: Negative.  Negative for rash.  Neurological: Negative.  Negative for dizziness, focal weakness, weakness and headaches.  Psychiatric/Behavioral: Negative.  The patient is not nervous/anxious.     As per HPI. Otherwise, a complete review of systems is negative.  PAST MEDICAL HISTORY: Past Medical History:  Diagnosis Date  . Anxiety   . Arthritis   . Complication of anesthesia    Difficulty  waking up.  . Cough   . Depression   . GERD (gastroesophageal reflux disease)   . Hyperlipidemia   . Hypertension   . Thyroid nodule   . Torn meniscus    R     PAST SURGICAL HISTORY: Past Surgical History:  Procedure Laterality Date  . BREAST BIOPSY Left 09/2015   [place] core bx  NEG   . BREAST BIOPSY Right    both core and excisional done, multiples for fibroadenomas  . BREAST BIOPSY Right 10/26/2020   stereo biopsy, clip, [facility]  . BREAST EXCISIONAL BIOPSY Left 2014   fibroadenomas  . BREAST LUMPECTOMY Right 12/07/2020   [place]  . BREAST LUMPECTOMY WITH NEEDLE LOCALIZATION AND AXILLARY SENTINEL LYMPH NODE BX Right 12/07/2020   Procedure: BREAST LUMPECTOMY WITH NEEDLE LOCALIZATION AND AXILLARY SENTINEL LYMPH NODE BX;  Surgeon: Robert Bellow, MD;  Location: [facility];  Service: General;  Laterality: Right;  . CESAREAN SECTION    . CYSTOSCOPY N/A 07/09/2018   Procedure: CYSTOSCOPY;  Surgeon: Will Bonnet, MD;  Location: [facility];  Service: Gynecology;  Laterality: N/A;  . ROBOTIC ASSISTED LAPAROSCOPIC OVARIAN CYSTECTOMY Bilateral 07/09/2018   Procedure: ROBOTIC ASSISTED LAPAROSCOPIC OVARIAN CYSTECTOMY;  Surgeon: Will Bonnet, MD;  Location: [facility];  Service: Gynecology;  Laterality: Bilateral;  . ROBOTIC ASSISTED TOTAL HYSTERECTOMY WITH BILATERAL SALPINGO OOPHERECTOMY Bilateral 07/09/2018   Procedure: ROBOTIC ASSISTED TOTAL HYSTERECTOMY WITH BILATERAL SALPINGO OOPHORECTOMY;  Surgeon: Will Bonnet, MD;  Location: [facility];  Service: Gynecology;  Laterality: Bilateral;  . TONSILLECTOMY      FAMILY HISTORY: Family History  Problem Relation Age of Onset  . Diabetes Maternal Aunt   .  Hypertension Maternal Aunt   . Prostate cancer Father   . Heart failure Father   . Pancreatic cancer Paternal Aunt   . Breast cancer Neg Hx   . Bladder Cancer Neg Hx   . Kidney cancer Neg Hx     ADVANCED DIRECTIVES (Y/N):  N  HEALTH MAINTENANCE: Social History   Tobacco Use  .  Smoking status: Former Smoker    Years: 25.00    Types: Cigarettes    Quit date: 03/19/2002    Years since quitting: 18.8  . Smokeless tobacco: Never Used  Vaping Use  . Vaping Use: Never used  Substance Use Topics  . Alcohol use: No  . Drug use: No     Colonoscopy:  PAP:  Bone density:  Lipid panel:  Allergies  Allergen Reactions  . Norco [Hydrocodone-Acetaminophen] Nausea And Vomiting  . Penicillin G     Childhood allergy Has patient had a PCN reaction causing immediate rash, facial/tongue/throat swelling, SOB or lightheadedness with hypotension: Unknown Has patient had a PCN reaction causing severe rash involving mucus membranes or skin necrosis: Unknown Has patient had a PCN reaction that required hospitalization: Unknown Has patient had a PCN reaction occurring within the last 10 years: No If all of the above answers are "NO", then may proceed with Cephalosporin use.   . Tape Rash    Current Outpatient Medications  Medication Sig Dispense Refill  . DULoxetine (CYMBALTA) 30 MG capsule Take 30 mg by mouth daily. Take with 46m tab for a total of 90 mg    . DULoxetine (CYMBALTA) 60 MG capsule Take 60 mg by mouth daily. Take with 346mtab for a total of 90 mg    . enalapril (VASOTEC) 10 MG tablet Take 10 mg by mouth daily.     . hydrochlorothiazide (HYDRODIURIL) 12.5 MG tablet Take 12.5 mg by mouth daily.     . meclizine (ANTIVERT) 25 MG tablet Take 25 mg by mouth daily as needed for dizziness.     . pantoprazole (PROTONIX) 40 MG tablet Take 40 mg by mouth daily.    . pravastatin (PRAVACHOL) 40 MG tablet Take 40 mg by mouth at bedtime.     . pregabalin (LYRICA) 150 MG capsule Take 150 mg by mouth 2 (two) times daily.     . valACYclovir (VALTREX) 500 MG tablet Take 500 mg by mouth daily.      No current facility-administered medications for this visit.    OBJECTIVE: Vitals:   01/05/21 1417  BP: (!) 159/74  Pulse: 74  Resp: 16  Temp: 97.7 F (36.5 C)  SpO2: 98%      Body mass index is 31.25 kg/m.    ECOG FS:0 - Asymptomatic  General: Well-developed, well-nourished, no acute distress. Eyes: Pink conjunctiva, anicteric sclera. HEENT: Normocephalic, moist mucous membranes. Lungs: No audible wheezing or coughing. Heart: Regular rate and rhythm. Abdomen: Soft, nontender, no obvious distention. Musculoskeletal: No edema, cyanosis, or clubbing. Neuro: Alert, answering all questions appropriately. Cranial nerves grossly intact. Skin: No rashes or petechiae noted. Psych: Normal affect.   LAB RESULTS:  Lab Results  Component Value Date   NA 141 11/29/2020   K 3.2 (L) 11/29/2020   CL 102 11/29/2020   CO2 30 11/29/2020   GLUCOSE 106 (H) 11/29/2020   BUN 15 11/29/2020   CREATININE 0.95 11/29/2020   CALCIUM 9.0 11/29/2020   PROT 6.9 07/09/2018   ALBUMIN 3.3 (L) 07/09/2018   AST 32 07/09/2018   ALT 12 07/09/2018  ALKPHOS 51 07/09/2018   BILITOT 0.4 07/09/2018   GFRNONAA >60 11/29/2020   GFRAA 58 (L) 07/10/2018    Lab Results  Component Value Date   WBC 6.5 11/29/2020   HGB 12.4 11/29/2020   HCT 37.9 11/29/2020   MCV 83.3 11/29/2020   PLT 280 11/29/2020     STUDIES: No results found.  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  Oncotype DX 13.  PLAN:    1. 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  Oncotype DX score is 13 which is considered low risk, therefore patient does not require adjuvant chemotherapy.  She had consultation with radiation oncology earlier today to discuss adjuvant XRT. At the conclusion of all her treatments, patient will benefit from an aromatase inhibitor for a minimum of 5 years.  Return to clinic in approximately 2 months at the end of her radiation treatments for further evaluation and initiation of aromatase  inhibitor. 2.  Travel plans: Patient reports that she has a friend undergoing chemotherapy for breast cancer in [place].  She is unable to make this trip due to the [place] pandemic, with plans to reschedule in the future.   I spent a total of 20 minutes reviewing chart data, face-to-face evaluation with the patient, counseling and coordination of care as detailed above.   Patient expressed understanding and was in agreement with this plan. She also understands that She can call clinic at any time with any questions, concerns, or complaints.   Cancer Staging Carcinoma of lower-inner quadrant of right breast [facility]) Staging form: Breast, AJCC 8th Edition - Pathologic stage from 12/23/2020: Stage IA (pT1c, pN0, cM0, G2, ER+, PR+, HER2-) - Signed by Lloyd Huger, MD on 12/23/2020   Lloyd Huger, MD   01/07/2021 1:29 PM

## 2021-01-04 ENCOUNTER — Encounter: Payer: Self-pay | Admitting: Oncology

## 2021-01-05 ENCOUNTER — Inpatient Hospital Stay (HOSPITAL_BASED_OUTPATIENT_CLINIC_OR_DEPARTMENT_OTHER): Payer: Medicare Other | Admitting: Oncology

## 2021-01-05 ENCOUNTER — Encounter: Payer: Self-pay | Admitting: Radiation Oncology

## 2021-01-05 ENCOUNTER — Ambulatory Visit
Admission: RE | Admit: 2021-01-05 | Discharge: 2021-01-05 | Disposition: A | Discharge status: Home / Self Care | Payer: Medicare Other | Source: Ambulatory Visit | Attending: Radiation Oncology | Admitting: Radiation Oncology

## 2021-01-05 VITALS — BP 151/80 | HR 90 | Temp 98.0°F | Resp 16 | Wt 162.0 lb

## 2021-01-05 VITALS — BP 159/74 | HR 74 | Temp 97.7°F | Resp 16 | Ht 60.0 in | Wt 160.0 lb

## 2021-01-05 DIAGNOSIS — E785 Hyperlipidemia, unspecified: Secondary | ICD-10-CM | POA: Insufficient documentation

## 2021-01-05 DIAGNOSIS — C50311 Malignant neoplasm of lower-inner quadrant of right female breast: Secondary | ICD-10-CM

## 2021-01-05 DIAGNOSIS — K219 Gastro-esophageal reflux disease without esophagitis: Secondary | ICD-10-CM | POA: Insufficient documentation

## 2021-01-05 DIAGNOSIS — Z170 Estrogen receptor positive status [ER+]: Secondary | ICD-10-CM | POA: Diagnosis not present

## 2021-01-05 DIAGNOSIS — Z8042 Family history of malignant neoplasm of prostate: Secondary | ICD-10-CM | POA: Diagnosis not present

## 2021-01-05 DIAGNOSIS — Z87891 Personal history of nicotine dependence: Secondary | ICD-10-CM | POA: Diagnosis not present

## 2021-01-05 DIAGNOSIS — I10 Essential (primary) hypertension: Secondary | ICD-10-CM | POA: Insufficient documentation

## 2021-01-05 DIAGNOSIS — Z79899 Other long term (current) drug therapy: Secondary | ICD-10-CM | POA: Diagnosis not present

## 2021-01-05 DIAGNOSIS — Z800 Family history of malignant neoplasm of digestive organs: Secondary | ICD-10-CM | POA: Insufficient documentation

## 2021-01-05 DIAGNOSIS — C50211 Malignant neoplasm of upper-inner quadrant of right female breast: Secondary | ICD-10-CM | POA: Diagnosis present

## 2021-01-05 DIAGNOSIS — M129 Arthropathy, unspecified: Secondary | ICD-10-CM | POA: Insufficient documentation

## 2021-01-05 DIAGNOSIS — F418 Other specified anxiety disorders: Secondary | ICD-10-CM | POA: Diagnosis not present

## 2021-01-05 NOTE — Consult Note (Signed)
NEW PATIENT EVALUATION  Name: Margerie Fraiser  MRN: [id number]  Date:   01/05/2021     DOB: [birth year]   This 65 y.o. female patient presents to the clinic for initial evaluation of stage Ia (T1 cN0 M0).  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 Breast Cancer    DIAGNOSIS: The encounter diagnosis was Carcinoma of lower-inner quadrant of right breast in female, unspecified estrogen receptor status ([place]).   PREVIOUS INVESTIGATIONS:  Mammogram and ultrasound reviewed Pathology report reviewed Clinical notes reviewed  HPI: Patient is a 65 year old female who presented on screening mammogram with a possible mass in the right breast warranting further investigation.  She underwent tomographic unilateral right imaging showing focal breast distortion indeterminate in the right breast without sonographic correlate.  MRI of the breast confirmed a 1.6 cm Hansen mass with associated postbiopsy changes in the lower inner right breast consistent with patient's known malignancy.  She did have biopsy which was positive for invasive mammary carcinoma with lobular features.  Patient went on to have a wide local excision for a 2 x 1.5 x 1.4 cm invasive mammary carcinoma with mixed ductal and lobular features.  Margins were clear at 2 mm.  4 regional lymph nodes were examined all sentinel lymph nodes all negative for metastatic disease.  Oncotype DX showed recurrence score of 13 showing no benefit to adjuvant chemotherapy she is now referred to radiation collagen for consideration of treatment.  She is doing well.  She specifically denies breast tenderness cough or bone pain.  Patient is extremely large breasted.  PLANNED TREATMENT REGIMEN: Right whole breast radiation  PAST MEDICAL HISTORY:  has a past medical  history of Anxiety, Arthritis, Complication of anesthesia, Cough, Depression, GERD (gastroesophageal reflux disease), Hyperlipidemia, Hypertension, Thyroid nodule, and Torn meniscus.    PAST SURGICAL HISTORY:  Past Surgical History:  Procedure Laterality Date  . BREAST BIOPSY Left 09/2015   [place] core bx  NEG   . BREAST BIOPSY Right    both core and excisional done, multiples for fibroadenomas  . BREAST BIOPSY Right 10/26/2020   stereo biopsy, clip, [facility]  . BREAST EXCISIONAL BIOPSY Left 2014   fibroadenomas  . BREAST LUMPECTOMY Right 12/07/2020   [place]  . BREAST LUMPECTOMY WITH NEEDLE LOCALIZATION AND AXILLARY SENTINEL LYMPH NODE BX Right 12/07/2020   Procedure: BREAST LUMPECTOMY WITH NEEDLE LOCALIZATION AND AXILLARY SENTINEL LYMPH NODE BX;  Surgeon: Robert Bellow, MD;  Location: [facility];  Service: General;  Laterality: Right;  . CESAREAN SECTION    . CYSTOSCOPY N/A 07/09/2018   Procedure: CYSTOSCOPY;  Surgeon: Will Bonnet, MD;  Location: [facility];  Service: Gynecology;  Laterality: N/A;  . ROBOTIC ASSISTED LAPAROSCOPIC OVARIAN CYSTECTOMY Bilateral 07/09/2018   Procedure: ROBOTIC ASSISTED LAPAROSCOPIC OVARIAN CYSTECTOMY;  Surgeon: Will Bonnet, MD;  Location: [facility];  Service: Gynecology;  Laterality: Bilateral;  . ROBOTIC ASSISTED TOTAL HYSTERECTOMY WITH BILATERAL SALPINGO OOPHERECTOMY Bilateral 07/09/2018   Procedure: ROBOTIC ASSISTED TOTAL HYSTERECTOMY WITH BILATERAL SALPINGO OOPHORECTOMY;  Surgeon: Will Bonnet, MD;  Location: [facility];  Service: Gynecology;  Laterality: Bilateral;  . TONSILLECTOMY      FAMILY HISTORY: family history includes Diabetes in her maternal aunt; Heart failure in her father; Hypertension in her maternal aunt; Pancreatic cancer in her paternal aunt; Prostate cancer in her father.  SOCIAL  HISTORY:  reports that she quit smoking about 18 years ago. Her smoking use included cigarettes. She quit after 25.00 years of use. She has never used  smokeless tobacco. She reports that she does not drink alcohol and does not use drugs.  ALLERGIES: Norco [hydrocodone-acetaminophen], Penicillin g, and Tape  MEDICATIONS:  Current Outpatient Medications  Medication Sig Dispense Refill  . DULoxetine (CYMBALTA) 30 MG capsule Take 30 mg by mouth daily. Take with 73m tab for a total of 90 mg    . DULoxetine (CYMBALTA) 60 MG capsule Take 60 mg by mouth daily. Take with 346mtab for a total of 90 mg    . enalapril (VASOTEC) 10 MG tablet Take 10 mg by mouth daily.     . hydrochlorothiazide (HYDRODIURIL) 12.5 MG tablet Take 12.5 mg by mouth daily.     . meclizine (ANTIVERT) 25 MG tablet Take 25 mg by mouth daily as needed for dizziness.     . pantoprazole (PROTONIX) 40 MG tablet Take 40 mg by mouth daily.    . pravastatin (PRAVACHOL) 40 MG tablet Take 40 mg by mouth at bedtime.     . pregabalin (LYRICA) 150 MG capsule Take 150 mg by mouth 2 (two) times daily.     . valACYclovir (VALTREX) 500 MG tablet Take 500 mg by mouth daily.      No current facility-administered medications for this encounter.    ECOG PERFORMANCE STATUS:  0 - Asymptomatic  REVIEW OF SYSTEMS: Patient denies any weight loss, fatigue, weakness, fever, chills or night sweats. Patient denies any loss of vision, blurred vision. Patient denies any ringing  of the ears or hearing loss. No irregular heartbeat. Patient denies heart murmur or history of fainting. Patient denies any chest pain or pain radiating to her upper extremities. Patient denies any shortness of breath, difficulty breathing at night, cough or hemoptysis. Patient denies any swelling in the lower legs. Patient denies any nausea vomiting, vomiting of blood, or coffee ground material in the vomitus. Patient denies any stomach pain. Patient states has had normal bowel movements no significant constipation or diarrhea. Patient denies any dysuria, hematuria or significant nocturia. Patient denies any problems walking,  swelling in the joints or loss of balance. Patient denies any skin changes, loss of hair or loss of weight. Patient denies any excessive worrying or anxiety or significant depression. Patient denies any problems with insomnia. Patient denies excessive thirst, polyuria, polydipsia. Patient denies any swollen glands, patient denies easy bruising or easy bleeding. Patient denies any recent infections, allergies or URI. Patient "s visual fields have not changed significantly in recent time.   PHYSICAL EXAM: BP (!) 151/80 (BP Location: Left Arm, Patient Position: Sitting, Cuff Size: Normal)   Pulse 90   Temp 98 F (36.7 C) (Tympanic)   Resp 16   Wt 162 lb (73.5 kg)   BMI 31.64 kg/m  Patient is a wide local excision scar and sentinel node scar both healing well no dominant mass or nodularity is noted in either breast in 2 positions examined.  No axillary or supraclavicular adenopathy is identified.  Well-developed well-nourished patient in NAD. HEENT reveals PERLA, EOMI, discs not visualized.  Oral cavity is clear. No oral mucosal lesions are identified. Neck is clear without evidence of cervical or supraclavicular adenopathy. Lungs are clear to A&P. Cardiac examination is essentially unremarkable with regular rate and rhythm without murmur rub or thrill. Abdomen is benign with no organomegaly or masses noted. Motor sensory and DTR levels are equal  and symmetric in the upper and lower extremities. Cranial nerves II through XII are grossly intact. Proprioception is intact. No peripheral adenopathy or edema is identified. No motor or sensory levels are noted. Crude visual fields are within normal range.  LABORATORY DATA: Pathology report reviewed    RADIOLOGY RESULTS: Mammogram ultrasound and MRI scans reviewed compatible with above-stated findings   IMPRESSION: Stage Ia invasive mammary carcinoma with lobular features status post wide local excision of the right breast ER/PR positive HER2 negative in  65 year old female  PLAN: At this time based on the large volume of her right breast would plan on a course of radiation therapy to her whole breast.  Would plan on delivering 5040 cGy in 28 fractions.  I would also boost her scar based on the close 2 mm margin another 1400 cGy using electron beam.  Risks and benefits of treatment including skin reaction fatigue alteration of blood counts possible inclusion of superficial lung all were described in detail to the patient.  I have personally 7 ordered CT simulation.  Patient comprehends my recommendations well.  Patient also will benefit from antiestrogen estrogen therapy after completion of radiation.  I would like to take this opportunity to thank you for allowing me to participate in the care of your patient.Noreene Filbert, MD

## 2021-01-11 ENCOUNTER — Ambulatory Visit
Admission: RE | Admit: 2021-01-11 | Discharge: 2021-01-11 | Disposition: A | Discharge status: Home / Self Care | Payer: Medicare Other | Source: Ambulatory Visit | Attending: Radiation Oncology | Admitting: Radiation Oncology

## 2021-01-11 DIAGNOSIS — C50312 Malignant neoplasm of lower-inner quadrant of left female breast: Secondary | ICD-10-CM | POA: Diagnosis present

## 2021-01-11 DIAGNOSIS — Z170 Estrogen receptor positive status [ER+]: Secondary | ICD-10-CM | POA: Diagnosis not present

## 2021-01-13 ENCOUNTER — Other Ambulatory Visit: Payer: Self-pay | Admitting: *Deleted

## 2021-01-13 DIAGNOSIS — C50311 Malignant neoplasm of lower-inner quadrant of right female breast: Secondary | ICD-10-CM

## 2021-01-13 DIAGNOSIS — C50312 Malignant neoplasm of lower-inner quadrant of left female breast: Secondary | ICD-10-CM | POA: Diagnosis not present

## 2021-01-18 ENCOUNTER — Ambulatory Visit: Admission: RE | Admit: 2021-01-18 | Payer: Medicare Other | Source: Ambulatory Visit

## 2021-01-18 DIAGNOSIS — C50312 Malignant neoplasm of lower-inner quadrant of left female breast: Secondary | ICD-10-CM | POA: Insufficient documentation

## 2021-01-18 DIAGNOSIS — Z170 Estrogen receptor positive status [ER+]: Secondary | ICD-10-CM | POA: Diagnosis not present

## 2021-01-19 ENCOUNTER — Ambulatory Visit
Admission: RE | Admit: 2021-01-19 | Discharge: 2021-01-19 | Disposition: A | Discharge status: Home / Self Care | Payer: Medicare Other | Source: Ambulatory Visit | Attending: Radiation Oncology | Admitting: Radiation Oncology

## 2021-01-19 DIAGNOSIS — C50312 Malignant neoplasm of lower-inner quadrant of left female breast: Secondary | ICD-10-CM | POA: Diagnosis not present

## 2021-01-20 ENCOUNTER — Ambulatory Visit
Admission: RE | Admit: 2021-01-20 | Discharge: 2021-01-20 | Disposition: A | Discharge status: Home / Self Care | Payer: Medicare Other | Source: Ambulatory Visit | Attending: Radiation Oncology | Admitting: Radiation Oncology

## 2021-01-20 DIAGNOSIS — C50312 Malignant neoplasm of lower-inner quadrant of left female breast: Secondary | ICD-10-CM | POA: Diagnosis not present

## 2021-01-23 ENCOUNTER — Ambulatory Visit
Admission: RE | Admit: 2021-01-23 | Discharge: 2021-01-23 | Disposition: A | Discharge status: Home / Self Care | Payer: Medicare Other | Source: Ambulatory Visit | Attending: Radiation Oncology | Admitting: Radiation Oncology

## 2021-01-23 DIAGNOSIS — C50312 Malignant neoplasm of lower-inner quadrant of left female breast: Secondary | ICD-10-CM | POA: Diagnosis not present

## 2021-01-24 ENCOUNTER — Ambulatory Visit
Admission: RE | Admit: 2021-01-24 | Discharge: 2021-01-24 | Disposition: A | Discharge status: Home / Self Care | Payer: Medicare Other | Source: Ambulatory Visit | Attending: Radiation Oncology | Admitting: Radiation Oncology

## 2021-01-24 DIAGNOSIS — C50312 Malignant neoplasm of lower-inner quadrant of left female breast: Secondary | ICD-10-CM | POA: Diagnosis not present

## 2021-01-25 ENCOUNTER — Ambulatory Visit
Admission: RE | Admit: 2021-01-25 | Discharge: 2021-01-25 | Disposition: A | Discharge status: Home / Self Care | Payer: Medicare Other | Source: Ambulatory Visit | Attending: Radiation Oncology | Admitting: Radiation Oncology

## 2021-01-25 DIAGNOSIS — C50312 Malignant neoplasm of lower-inner quadrant of left female breast: Secondary | ICD-10-CM | POA: Diagnosis not present

## 2021-01-26 ENCOUNTER — Ambulatory Visit
Admission: RE | Admit: 2021-01-26 | Discharge: 2021-01-26 | Disposition: A | Discharge status: Home / Self Care | Payer: Medicare Other | Source: Ambulatory Visit | Attending: Radiation Oncology | Admitting: Radiation Oncology

## 2021-01-26 DIAGNOSIS — C50312 Malignant neoplasm of lower-inner quadrant of left female breast: Secondary | ICD-10-CM | POA: Diagnosis not present

## 2021-01-27 ENCOUNTER — Ambulatory Visit
Admission: RE | Admit: 2021-01-27 | Discharge: 2021-01-27 | Disposition: A | Discharge status: Home / Self Care | Payer: Medicare Other | Source: Ambulatory Visit | Attending: Radiation Oncology | Admitting: Radiation Oncology

## 2021-01-27 DIAGNOSIS — C50312 Malignant neoplasm of lower-inner quadrant of left female breast: Secondary | ICD-10-CM | POA: Diagnosis not present

## 2021-01-30 ENCOUNTER — Ambulatory Visit
Admission: RE | Admit: 2021-01-30 | Discharge: 2021-01-30 | Disposition: A | Discharge status: Home / Self Care | Payer: Medicare Other | Source: Ambulatory Visit | Attending: Radiation Oncology | Admitting: Radiation Oncology

## 2021-01-30 DIAGNOSIS — C50312 Malignant neoplasm of lower-inner quadrant of left female breast: Secondary | ICD-10-CM | POA: Diagnosis not present

## 2021-01-31 ENCOUNTER — Ambulatory Visit
Admission: RE | Admit: 2021-01-31 | Discharge: 2021-01-31 | Disposition: A | Discharge status: Home / Self Care | Payer: Medicare Other | Source: Ambulatory Visit | Attending: Radiation Oncology | Admitting: Radiation Oncology

## 2021-01-31 DIAGNOSIS — C50312 Malignant neoplasm of lower-inner quadrant of left female breast: Secondary | ICD-10-CM | POA: Diagnosis not present

## 2021-02-01 ENCOUNTER — Ambulatory Visit
Admission: RE | Admit: 2021-02-01 | Discharge: 2021-02-01 | Disposition: A | Discharge status: Home / Self Care | Payer: Medicare Other | Source: Ambulatory Visit | Attending: Radiation Oncology | Admitting: Radiation Oncology

## 2021-02-01 DIAGNOSIS — C50312 Malignant neoplasm of lower-inner quadrant of left female breast: Secondary | ICD-10-CM | POA: Diagnosis not present

## 2021-02-02 ENCOUNTER — Inpatient Hospital Stay: Payer: Medicare Other | Attending: Radiation Oncology

## 2021-02-02 ENCOUNTER — Ambulatory Visit
Admission: RE | Admit: 2021-02-02 | Discharge: 2021-02-02 | Disposition: A | Discharge status: Home / Self Care | Payer: Medicare Other | Source: Ambulatory Visit | Attending: Radiation Oncology | Admitting: Radiation Oncology

## 2021-02-02 DIAGNOSIS — C50311 Malignant neoplasm of lower-inner quadrant of right female breast: Secondary | ICD-10-CM | POA: Insufficient documentation

## 2021-02-02 DIAGNOSIS — Z171 Estrogen receptor negative status [ER-]: Secondary | ICD-10-CM | POA: Insufficient documentation

## 2021-02-02 DIAGNOSIS — C50312 Malignant neoplasm of lower-inner quadrant of left female breast: Secondary | ICD-10-CM | POA: Diagnosis not present

## 2021-02-02 LAB — CBC
HCT: 38.4 % (ref 36.0–46.0)
Hemoglobin: 12.7 g/dL (ref 12.0–15.0)
MCH: 27.5 pg (ref 26.0–34.0)
MCHC: 33.1 g/dL (ref 30.0–36.0)
MCV: 83.3 fL (ref 80.0–100.0)
Platelets: 267 10*3/uL (ref 150–400)
RBC: 4.61 MIL/uL (ref 3.87–5.11)
RDW: 14.8 % (ref 11.5–15.5)
WBC: 5.7 10*3/uL (ref 4.0–10.5)
nRBC: 0 % (ref 0.0–0.2)

## 2021-02-03 ENCOUNTER — Ambulatory Visit
Admission: RE | Admit: 2021-02-03 | Discharge: 2021-02-03 | Disposition: A | Discharge status: Home / Self Care | Payer: Medicare Other | Source: Ambulatory Visit | Attending: Radiation Oncology | Admitting: Radiation Oncology

## 2021-02-03 ENCOUNTER — Other Ambulatory Visit (HOSPITAL_COMMUNITY): Payer: Self-pay | Admitting: Internal Medicine

## 2021-02-03 ENCOUNTER — Ambulatory Visit: Payer: Medicare Other | Attending: Internal Medicine

## 2021-02-03 DIAGNOSIS — Z23 Encounter for immunization: Secondary | ICD-10-CM

## 2021-02-03 DIAGNOSIS — C50312 Malignant neoplasm of lower-inner quadrant of left female breast: Secondary | ICD-10-CM | POA: Diagnosis not present

## 2021-02-03 NOTE — Progress Notes (Signed)
   Covid-19 Vaccination Clinic  Name:  Ivy Puryear    MRN: [id number] DOB: Apr 24, 1956  02/03/2021  Ms. Asch was observed post Covid-19 immunization for 15 minutes without incident. She was provided with Vaccine Information Sheet and instruction to access the V-Safe system.   Ms. Gaughran was instructed to call 911 with any severe reactions post vaccine: Marland Kitchen Difficulty breathing  . Swelling of face and throat  . A fast heartbeat  . A bad rash all over body  . Dizziness and weakness   Immunizations Administered    Name Date Dose VIS Date Route   PFIZER Comrnaty(Gray TOP) Covid-19 Vaccine 02/03/2021 10:57 AM 0.3 mL 11/24/2020 Intramuscular   Manufacturer: [place]   Lot: [id number]   NDC: [phone]

## 2021-02-06 ENCOUNTER — Ambulatory Visit
Admission: RE | Admit: 2021-02-06 | Discharge: 2021-02-06 | Disposition: A | Discharge status: Home / Self Care | Payer: Medicare Other | Source: Ambulatory Visit | Attending: Radiation Oncology | Admitting: Radiation Oncology

## 2021-02-06 DIAGNOSIS — C50312 Malignant neoplasm of lower-inner quadrant of left female breast: Secondary | ICD-10-CM | POA: Diagnosis not present

## 2021-02-07 ENCOUNTER — Ambulatory Visit
Admission: RE | Admit: 2021-02-07 | Discharge: 2021-02-07 | Disposition: A | Discharge status: Home / Self Care | Payer: Medicare Other | Source: Ambulatory Visit | Attending: Radiation Oncology | Admitting: Radiation Oncology

## 2021-02-07 DIAGNOSIS — C50312 Malignant neoplasm of lower-inner quadrant of left female breast: Secondary | ICD-10-CM | POA: Diagnosis not present

## 2021-02-08 ENCOUNTER — Ambulatory Visit
Admission: RE | Admit: 2021-02-08 | Discharge: 2021-02-08 | Disposition: A | Discharge status: Home / Self Care | Payer: Medicare Other | Source: Ambulatory Visit | Attending: Radiation Oncology | Admitting: Radiation Oncology

## 2021-02-08 DIAGNOSIS — C50312 Malignant neoplasm of lower-inner quadrant of left female breast: Secondary | ICD-10-CM | POA: Diagnosis not present

## 2021-02-09 ENCOUNTER — Ambulatory Visit
Admission: RE | Admit: 2021-02-09 | Discharge: 2021-02-09 | Disposition: A | Discharge status: Home / Self Care | Payer: Medicare Other | Source: Ambulatory Visit | Attending: Radiation Oncology | Admitting: Radiation Oncology

## 2021-02-09 DIAGNOSIS — C50312 Malignant neoplasm of lower-inner quadrant of left female breast: Secondary | ICD-10-CM | POA: Diagnosis not present

## 2021-02-10 ENCOUNTER — Ambulatory Visit
Admission: RE | Admit: 2021-02-10 | Discharge: 2021-02-10 | Disposition: A | Discharge status: Home / Self Care | Payer: Medicare Other | Source: Ambulatory Visit | Attending: Radiation Oncology | Admitting: Radiation Oncology

## 2021-02-10 DIAGNOSIS — C50312 Malignant neoplasm of lower-inner quadrant of left female breast: Secondary | ICD-10-CM | POA: Diagnosis not present

## 2021-02-13 ENCOUNTER — Ambulatory Visit
Admission: RE | Admit: 2021-02-13 | Discharge: 2021-02-13 | Disposition: A | Discharge status: Home / Self Care | Payer: Medicare Other | Source: Ambulatory Visit | Attending: Radiation Oncology | Admitting: Radiation Oncology

## 2021-02-13 DIAGNOSIS — C50312 Malignant neoplasm of lower-inner quadrant of left female breast: Secondary | ICD-10-CM | POA: Diagnosis not present

## 2021-02-14 ENCOUNTER — Ambulatory Visit
Admission: RE | Admit: 2021-02-14 | Discharge: 2021-02-14 | Disposition: A | Discharge status: Home / Self Care | Payer: Medicare Other | Source: Ambulatory Visit | Attending: Radiation Oncology | Admitting: Radiation Oncology

## 2021-02-14 DIAGNOSIS — C50312 Malignant neoplasm of lower-inner quadrant of left female breast: Secondary | ICD-10-CM | POA: Insufficient documentation

## 2021-02-14 DIAGNOSIS — Z170 Estrogen receptor positive status [ER+]: Secondary | ICD-10-CM | POA: Diagnosis not present

## 2021-02-15 ENCOUNTER — Ambulatory Visit
Admission: RE | Admit: 2021-02-15 | Discharge: 2021-02-15 | Disposition: A | Discharge status: Home / Self Care | Payer: Medicare Other | Source: Ambulatory Visit | Attending: Radiation Oncology | Admitting: Radiation Oncology

## 2021-02-15 DIAGNOSIS — C50312 Malignant neoplasm of lower-inner quadrant of left female breast: Secondary | ICD-10-CM | POA: Diagnosis not present

## 2021-02-16 ENCOUNTER — Ambulatory Visit
Admission: RE | Admit: 2021-02-16 | Discharge: 2021-02-16 | Disposition: A | Discharge status: Home / Self Care | Payer: Medicare Other | Source: Ambulatory Visit | Attending: Radiation Oncology | Admitting: Radiation Oncology

## 2021-02-16 ENCOUNTER — Inpatient Hospital Stay: Payer: Medicare Other | Attending: Radiation Oncology

## 2021-02-16 ENCOUNTER — Other Ambulatory Visit: Payer: Self-pay

## 2021-02-16 DIAGNOSIS — C50311 Malignant neoplasm of lower-inner quadrant of right female breast: Secondary | ICD-10-CM | POA: Insufficient documentation

## 2021-02-16 DIAGNOSIS — C50312 Malignant neoplasm of lower-inner quadrant of left female breast: Secondary | ICD-10-CM | POA: Diagnosis not present

## 2021-02-16 DIAGNOSIS — Z79811 Long term (current) use of aromatase inhibitors: Secondary | ICD-10-CM | POA: Insufficient documentation

## 2021-02-16 DIAGNOSIS — Z87891 Personal history of nicotine dependence: Secondary | ICD-10-CM | POA: Insufficient documentation

## 2021-02-16 DIAGNOSIS — Z170 Estrogen receptor positive status [ER+]: Secondary | ICD-10-CM | POA: Diagnosis not present

## 2021-02-16 LAB — CBC
HCT: 37.9 % (ref 36.0–46.0)
Hemoglobin: 12.2 g/dL (ref 12.0–15.0)
MCH: 27.4 pg (ref 26.0–34.0)
MCHC: 32.2 g/dL (ref 30.0–36.0)
MCV: 85.2 fL (ref 80.0–100.0)
Platelets: 263 10*3/uL (ref 150–400)
RBC: 4.45 MIL/uL (ref 3.87–5.11)
RDW: 14.8 % (ref 11.5–15.5)
WBC: 5.6 10*3/uL (ref 4.0–10.5)
nRBC: 0 % (ref 0.0–0.2)

## 2021-02-17 ENCOUNTER — Ambulatory Visit
Admission: RE | Admit: 2021-02-17 | Discharge: 2021-02-17 | Disposition: A | Discharge status: Home / Self Care | Payer: Medicare Other | Source: Ambulatory Visit | Attending: Radiation Oncology | Admitting: Radiation Oncology

## 2021-02-17 DIAGNOSIS — C50312 Malignant neoplasm of lower-inner quadrant of left female breast: Secondary | ICD-10-CM | POA: Diagnosis not present

## 2021-02-20 ENCOUNTER — Ambulatory Visit
Admission: RE | Admit: 2021-02-20 | Discharge: 2021-02-20 | Disposition: A | Discharge status: Home / Self Care | Payer: Medicare Other | Source: Ambulatory Visit | Attending: Radiation Oncology | Admitting: Radiation Oncology

## 2021-02-20 ENCOUNTER — Other Ambulatory Visit: Payer: Self-pay | Admitting: *Deleted

## 2021-02-20 DIAGNOSIS — C50312 Malignant neoplasm of lower-inner quadrant of left female breast: Secondary | ICD-10-CM | POA: Diagnosis not present

## 2021-02-20 MED ORDER — SILVER SULFADIAZINE 1 % EX CREA: 1.0000 "application " | TOPICAL_CREAM | Freq: Two times a day (BID) | CUTANEOUS | 2 refills | Status: DC

## 2021-02-21 ENCOUNTER — Ambulatory Visit
Admission: RE | Admit: 2021-02-21 | Discharge: 2021-02-21 | Disposition: A | Discharge status: Home / Self Care | Payer: Medicare Other | Source: Ambulatory Visit | Attending: Radiation Oncology | Admitting: Radiation Oncology

## 2021-02-21 DIAGNOSIS — C50312 Malignant neoplasm of lower-inner quadrant of left female breast: Secondary | ICD-10-CM | POA: Diagnosis not present

## 2021-02-22 ENCOUNTER — Ambulatory Visit
Admission: RE | Admit: 2021-02-22 | Discharge: 2021-02-22 | Disposition: A | Discharge status: Home / Self Care | Payer: Medicare Other | Source: Ambulatory Visit | Attending: Radiation Oncology | Admitting: Radiation Oncology

## 2021-02-22 DIAGNOSIS — C50312 Malignant neoplasm of lower-inner quadrant of left female breast: Secondary | ICD-10-CM | POA: Diagnosis not present

## 2021-02-23 ENCOUNTER — Ambulatory Visit
Admission: RE | Admit: 2021-02-23 | Discharge: 2021-02-23 | Disposition: A | Discharge status: Home / Self Care | Payer: Medicare Other | Source: Ambulatory Visit | Attending: Radiation Oncology | Admitting: Radiation Oncology

## 2021-02-23 DIAGNOSIS — C50312 Malignant neoplasm of lower-inner quadrant of left female breast: Secondary | ICD-10-CM | POA: Diagnosis not present

## 2021-02-24 ENCOUNTER — Ambulatory Visit
Admission: RE | Admit: 2021-02-24 | Discharge: 2021-02-24 | Disposition: A | Discharge status: Home / Self Care | Payer: Medicare Other | Source: Ambulatory Visit | Attending: Radiation Oncology | Admitting: Radiation Oncology

## 2021-02-24 DIAGNOSIS — C50312 Malignant neoplasm of lower-inner quadrant of left female breast: Secondary | ICD-10-CM | POA: Diagnosis not present

## 2021-02-27 ENCOUNTER — Ambulatory Visit
Admission: RE | Admit: 2021-02-27 | Discharge: 2021-02-27 | Disposition: A | Discharge status: Home / Self Care | Payer: Medicare Other | Source: Ambulatory Visit | Attending: Radiation Oncology | Admitting: Radiation Oncology

## 2021-02-27 DIAGNOSIS — C50312 Malignant neoplasm of lower-inner quadrant of left female breast: Secondary | ICD-10-CM | POA: Diagnosis not present

## 2021-02-28 ENCOUNTER — Ambulatory Visit
Admission: RE | Admit: 2021-02-28 | Discharge: 2021-02-28 | Disposition: A | Discharge status: Home / Self Care | Payer: Medicare Other | Source: Ambulatory Visit | Attending: Radiation Oncology | Admitting: Radiation Oncology

## 2021-02-28 DIAGNOSIS — C50312 Malignant neoplasm of lower-inner quadrant of left female breast: Secondary | ICD-10-CM | POA: Diagnosis not present

## 2021-03-01 ENCOUNTER — Ambulatory Visit
Admission: RE | Admit: 2021-03-01 | Discharge: 2021-03-01 | Disposition: A | Discharge status: Home / Self Care | Payer: Medicare Other | Source: Ambulatory Visit | Attending: Radiation Oncology | Admitting: Radiation Oncology

## 2021-03-01 DIAGNOSIS — C50312 Malignant neoplasm of lower-inner quadrant of left female breast: Secondary | ICD-10-CM | POA: Diagnosis not present

## 2021-03-02 ENCOUNTER — Ambulatory Visit
Admission: RE | Admit: 2021-03-02 | Discharge: 2021-03-02 | Disposition: A | Discharge status: Home / Self Care | Payer: Medicare Other | Source: Ambulatory Visit | Attending: Radiation Oncology | Admitting: Radiation Oncology

## 2021-03-02 ENCOUNTER — Inpatient Hospital Stay: Payer: Medicare Other

## 2021-03-02 DIAGNOSIS — C50312 Malignant neoplasm of lower-inner quadrant of left female breast: Secondary | ICD-10-CM | POA: Diagnosis not present

## 2021-03-02 DIAGNOSIS — C50311 Malignant neoplasm of lower-inner quadrant of right female breast: Secondary | ICD-10-CM

## 2021-03-02 LAB — CBC
HCT: 38.8 % (ref 36.0–46.0)
Hemoglobin: 12.4 g/dL (ref 12.0–15.0)
MCH: 27.6 pg (ref 26.0–34.0)
MCHC: 32 g/dL (ref 30.0–36.0)
MCV: 86.2 fL (ref 80.0–100.0)
Platelets: 242 10*3/uL (ref 150–400)
RBC: 4.5 MIL/uL (ref 3.87–5.11)
RDW: 15.1 % (ref 11.5–15.5)
WBC: 4.9 10*3/uL (ref 4.0–10.5)
nRBC: 0 % (ref 0.0–0.2)

## 2021-03-03 ENCOUNTER — Ambulatory Visit
Admission: RE | Admit: 2021-03-03 | Discharge: 2021-03-03 | Disposition: A | Discharge status: Home / Self Care | Payer: Medicare Other | Source: Ambulatory Visit | Attending: Radiation Oncology | Admitting: Radiation Oncology

## 2021-03-03 DIAGNOSIS — C50312 Malignant neoplasm of lower-inner quadrant of left female breast: Secondary | ICD-10-CM | POA: Diagnosis not present

## 2021-03-03 NOTE — Progress Notes (Signed)
[facility]  Telephone:([phone]) [phone] Fax:(336) [phone]  ID: Amanda Hardin OB: June 10, 1956  MR#: [id number]  [facility]#:[id number]  Patient Care Team: Derinda Late, MD as PCP - General (Family Medicine) Clent Jacks, RN as Registered Nurse Rico Junker, RN as Registered Nurse Rico Junker, RN as Registered Nurse  CHIEF COMPLAINT: Pathologic stage Ia ER/PR positive, HER-2 negative invasive carcinoma of the right lower inner quadrant breast.  Oncotype Dx score of 13 which is low risk.  INTERVAL HISTORY: Patient returns to clinic today for further evaluation and initiation of letrozole.  She had some skin breakdown and tenderness with XRT, but otherwise tolerated it well.  She completes her treatment later this week.  She has no neurologic complaints.  She denies any recent fevers or illnesses.  She has a good appetite and denies weight loss.  She has no chest pain, shortness of breath, cough, or hemoptysis.  She denies any nausea, vomiting, constipation, or diarrhea.  She has no urinary complaints.  Patient offers no further specific complaints today.  REVIEW OF SYSTEMS:   Review of Systems  Constitutional: Negative.  Negative for fever, malaise/fatigue and weight loss.  Respiratory: Negative.  Negative for cough, hemoptysis and shortness of breath.   Cardiovascular: Negative.  Negative for chest pain and leg swelling.  Gastrointestinal: Negative.  Negative for abdominal pain.  Genitourinary: Negative.  Negative for dysuria.  Musculoskeletal: Negative.  Negative for back pain.  Skin: Negative.  Negative for rash.  Neurological: Negative.  Negative for dizziness, focal weakness, weakness and headaches.  Psychiatric/Behavioral: Negative.  The patient is not nervous/anxious.     As per HPI. Otherwise, a complete review of systems is negative.  PAST MEDICAL HISTORY: Past Medical History:  Diagnosis Date  . Anxiety   . Arthritis   . Complication of  anesthesia    Difficulty waking up.  . Cough   . Depression   . GERD (gastroesophageal reflux disease)   . Hyperlipidemia   . Hypertension   . Thyroid nodule   . Torn meniscus    R     PAST SURGICAL HISTORY: Past Surgical History:  Procedure Laterality Date  . BREAST BIOPSY Left 09/2015   [place] core bx  NEG   . BREAST BIOPSY Right    both core and excisional done, multiples for fibroadenomas  . BREAST BIOPSY Right 10/26/2020   stereo biopsy, clip, [facility]  . BREAST EXCISIONAL BIOPSY Left 2014   fibroadenomas  . BREAST LUMPECTOMY Right 12/07/2020   [place]  . BREAST LUMPECTOMY WITH NEEDLE LOCALIZATION AND AXILLARY SENTINEL LYMPH NODE BX Right 12/07/2020   Procedure: BREAST LUMPECTOMY WITH NEEDLE LOCALIZATION AND AXILLARY SENTINEL LYMPH NODE BX;  Surgeon: Robert Bellow, MD;  Location: [facility];  Service: General;  Laterality: Right;  . CESAREAN SECTION    . CYSTOSCOPY N/A 07/09/2018   Procedure: CYSTOSCOPY;  Surgeon: Will Bonnet, MD;  Location: [facility];  Service: Gynecology;  Laterality: N/A;  . ROBOTIC ASSISTED LAPAROSCOPIC OVARIAN CYSTECTOMY Bilateral 07/09/2018   Procedure: ROBOTIC ASSISTED LAPAROSCOPIC OVARIAN CYSTECTOMY;  Surgeon: Will Bonnet, MD;  Location: [facility];  Service: Gynecology;  Laterality: Bilateral;  . ROBOTIC ASSISTED TOTAL HYSTERECTOMY WITH BILATERAL SALPINGO OOPHERECTOMY Bilateral 07/09/2018   Procedure: ROBOTIC ASSISTED TOTAL HYSTERECTOMY WITH BILATERAL SALPINGO OOPHORECTOMY;  Surgeon: Will Bonnet, MD;  Location: [facility];  Service: Gynecology;  Laterality: Bilateral;  . TONSILLECTOMY      FAMILY HISTORY: Family History  Problem Relation Age of Onset  . Diabetes  Maternal Aunt   . Hypertension Maternal Aunt   . Prostate cancer Father   . Heart failure Father   . Pancreatic cancer Paternal Aunt   . Breast cancer Neg Hx   . Bladder Cancer Neg Hx   . Kidney cancer Neg Hx     ADVANCED DIRECTIVES (Y/N):  N  HEALTH MAINTENANCE: Social  History   Tobacco Use  . Smoking status: Former Smoker    Years: 25.00    Types: Cigarettes    Quit date: 03/19/2002    Years since quitting: 18.9  . Smokeless tobacco: Never Used  Vaping Use  . Vaping Use: Never used  Substance Use Topics  . Alcohol use: No  . Drug use: No     Colonoscopy:  PAP:  Bone density:  Lipid panel:  Allergies  Allergen Reactions  . Norco [Hydrocodone-Acetaminophen] Nausea And Vomiting  . Penicillin G     Childhood allergy Has patient had a PCN reaction causing immediate rash, facial/tongue/throat swelling, SOB or lightheadedness with hypotension: Unknown Has patient had a PCN reaction causing severe rash involving mucus membranes or skin necrosis: Unknown Has patient had a PCN reaction that required hospitalization: Unknown Has patient had a PCN reaction occurring within the last 10 years: No If all of the above answers are "NO", then may proceed with Cephalosporin use.   . Tape Rash    Current Outpatient Medications  Medication Sig Dispense Refill  . DULoxetine (CYMBALTA) 30 MG capsule Take 30 mg by mouth daily. Take with 68m tab for a total of 90 mg    . DULoxetine (CYMBALTA) 60 MG capsule Take 60 mg by mouth daily. Take with 31mtab for a total of 90 mg    . enalapril (VASOTEC) 10 MG tablet Take 10 mg by mouth daily.     . hydrochlorothiazide (HYDRODIURIL) 12.5 MG tablet Take 12.5 mg by mouth daily.     . Marland Kitchenetrozole (FEMARA) 2.5 MG tablet Take 1 tablet (2.5 mg total) by mouth daily. 30 tablet 3  . meclizine (ANTIVERT) 25 MG tablet Take 25 mg by mouth daily as needed for dizziness.     . pantoprazole (PROTONIX) 40 MG tablet Take 40 mg by mouth daily.    . pravastatin (PRAVACHOL) 40 MG tablet Take 40 mg by mouth at bedtime.     . pregabalin (LYRICA) 150 MG capsule Take 150 mg by mouth 2 (two) times daily.     . silver sulfADIAZINE (SILVADENE) 1 % cream Apply 1 application topically 2 (two) times daily. 85 g 2  . valACYclovir (VALTREX) 500 MG  tablet Take 500 mg by mouth daily.      No current facility-administered medications for this visit.    OBJECTIVE: Vitals:   03/07/21 1320  BP: (!) 145/70  Pulse: 90  Resp: 16  Temp: 97.8 F (36.6 C)     Body mass index is 33.04 kg/m.    ECOG FS:0 - Asymptomatic  General: Well-developed, well-nourished, no acute distress. Eyes: Pink conjunctiva, anicteric sclera. HEENT: Normocephalic, moist mucous membranes. Lungs: No audible wheezing or coughing. Heart: Regular rate and rhythm. Abdomen: Soft, nontender, no obvious distention. Musculoskeletal: No edema, cyanosis, or clubbing. Neuro: Alert, answering all questions appropriately. Cranial nerves grossly intact. Skin: No rashes or petechiae noted. Psych: Normal affect.   LAB RESULTS:  Lab Results  Component Value Date   NA 141 11/29/2020   K 3.2 (L) 11/29/2020   CL 102 11/29/2020   CO2 30 11/29/2020   GLUCOSE 106 (  H) 11/29/2020   BUN 15 11/29/2020   CREATININE 0.95 11/29/2020   CALCIUM 9.0 11/29/2020   PROT 6.9 07/09/2018   ALBUMIN 3.3 (L) 07/09/2018   AST 32 07/09/2018   ALT 12 07/09/2018   ALKPHOS 51 07/09/2018   BILITOT 0.4 07/09/2018   GFRNONAA >60 11/29/2020   GFRAA 58 (L) 07/10/2018    Lab Results  Component Value Date   WBC 4.9 03/02/2021   HGB 12.4 03/02/2021   HCT 38.8 03/02/2021   MCV 86.2 03/02/2021   PLT 242 03/02/2021     STUDIES: No results found.  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  Oncotype DX 13.  PLAN:    1. 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  Oncotype DX score is 13 which is considered low risk, therefore patient did not require adjuvant chemotherapy.  She will complete adjuvant XRT on March 09, 2021.  She was given a prescription for letrozole which she will take for a  minimum of 5 years completing treatment in March 2027.  Patient will require baseline bone mineral density in the next 1 to 2 weeks.  Return to clinic in 3 months for routine evaluation and to assess her toleration of letrozole.    I spent a total of 20 minutes reviewing chart data, face-to-face evaluation with the patient, counseling and coordination of care as detailed above.  Patient expressed understanding and was in agreement with this plan. She also understands that She can call clinic at any time with any questions, concerns, or complaints.   Cancer Staging Carcinoma of lower-inner quadrant of right breast [facility]) Staging form: Breast, AJCC 8th Edition - Pathologic stage from 12/23/2020: Stage IA (pT1c, pN0, cM0, G2, ER+, PR+, HER2-) - Signed by Lloyd Huger, MD on 12/23/2020 Stage prefix: Initial diagnosis Histologic grading system: 3 grade system   Lloyd Huger, MD   03/07/2021 1:51 PM

## 2021-03-06 ENCOUNTER — Ambulatory Visit
Admission: RE | Admit: 2021-03-06 | Discharge: 2021-03-06 | Disposition: A | Discharge status: Home / Self Care | Payer: Medicare Other | Source: Ambulatory Visit | Attending: Radiation Oncology | Admitting: Radiation Oncology

## 2021-03-06 DIAGNOSIS — C50312 Malignant neoplasm of lower-inner quadrant of left female breast: Secondary | ICD-10-CM | POA: Diagnosis not present

## 2021-03-07 ENCOUNTER — Ambulatory Visit
Admission: RE | Admit: 2021-03-07 | Discharge: 2021-03-07 | Disposition: A | Discharge status: Home / Self Care | Payer: Medicare Other | Source: Ambulatory Visit | Attending: Radiation Oncology | Admitting: Radiation Oncology

## 2021-03-07 ENCOUNTER — Inpatient Hospital Stay (HOSPITAL_BASED_OUTPATIENT_CLINIC_OR_DEPARTMENT_OTHER): Payer: Medicare Other | Admitting: Oncology

## 2021-03-07 ENCOUNTER — Encounter: Payer: Self-pay | Admitting: Oncology

## 2021-03-07 VITALS — BP 145/70 | HR 90 | Temp 97.8°F | Resp 16 | Wt 169.2 lb

## 2021-03-07 DIAGNOSIS — Z170 Estrogen receptor positive status [ER+]: Secondary | ICD-10-CM | POA: Diagnosis not present

## 2021-03-07 DIAGNOSIS — C50312 Malignant neoplasm of lower-inner quadrant of left female breast: Secondary | ICD-10-CM | POA: Diagnosis not present

## 2021-03-07 DIAGNOSIS — C50311 Malignant neoplasm of lower-inner quadrant of right female breast: Secondary | ICD-10-CM | POA: Diagnosis not present

## 2021-03-07 MED ORDER — LETROZOLE 2.5 MG PO TABS: 2.5000 mg | ORAL_TABLET | Freq: Every day | ORAL | 3 refills | Status: DC

## 2021-03-07 NOTE — Progress Notes (Signed)
Patient here for follow up no concerns today. 

## 2021-03-08 ENCOUNTER — Ambulatory Visit
Admission: RE | Admit: 2021-03-08 | Discharge: 2021-03-08 | Disposition: A | Discharge status: Home / Self Care | Payer: Medicare Other | Source: Ambulatory Visit | Attending: Radiation Oncology | Admitting: Radiation Oncology

## 2021-03-08 DIAGNOSIS — C50312 Malignant neoplasm of lower-inner quadrant of left female breast: Secondary | ICD-10-CM | POA: Diagnosis not present

## 2021-03-09 ENCOUNTER — Ambulatory Visit
Admission: RE | Admit: 2021-03-09 | Discharge: 2021-03-09 | Disposition: A | Discharge status: Home / Self Care | Payer: Medicare Other | Source: Ambulatory Visit | Attending: Radiation Oncology | Admitting: Radiation Oncology

## 2021-03-09 DIAGNOSIS — C50312 Malignant neoplasm of lower-inner quadrant of left female breast: Secondary | ICD-10-CM | POA: Diagnosis not present

## 2021-03-20 ENCOUNTER — Ambulatory Visit
Admission: RE | Admit: 2021-03-20 | Discharge: 2021-03-20 | Disposition: A | Discharge status: Home / Self Care | Payer: Medicare Other | Source: Ambulatory Visit | Attending: Oncology | Admitting: Oncology

## 2021-03-20 ENCOUNTER — Other Ambulatory Visit: Payer: Self-pay

## 2021-03-20 DIAGNOSIS — Z923 Personal history of irradiation: Secondary | ICD-10-CM | POA: Diagnosis not present

## 2021-03-20 DIAGNOSIS — Z780 Asymptomatic menopausal state: Secondary | ICD-10-CM | POA: Diagnosis not present

## 2021-03-20 DIAGNOSIS — Z170 Estrogen receptor positive status [ER+]: Secondary | ICD-10-CM

## 2021-03-20 DIAGNOSIS — Z13820 Encounter for screening for osteoporosis: Secondary | ICD-10-CM | POA: Insufficient documentation

## 2021-03-20 DIAGNOSIS — C50311 Malignant neoplasm of lower-inner quadrant of right female breast: Secondary | ICD-10-CM | POA: Insufficient documentation

## 2021-03-20 DIAGNOSIS — Z853 Personal history of malignant neoplasm of breast: Secondary | ICD-10-CM | POA: Insufficient documentation

## 2021-04-12 ENCOUNTER — Ambulatory Visit
Admission: RE | Admit: 2021-04-12 | Discharge: 2021-04-12 | Disposition: A | Discharge status: Home / Self Care | Payer: Medicare Other | Source: Ambulatory Visit | Attending: Radiation Oncology | Admitting: Radiation Oncology

## 2021-04-12 ENCOUNTER — Other Ambulatory Visit: Payer: Self-pay

## 2021-04-12 ENCOUNTER — Encounter: Payer: Self-pay | Admitting: Radiation Oncology

## 2021-04-12 DIAGNOSIS — C50211 Malignant neoplasm of upper-inner quadrant of right female breast: Secondary | ICD-10-CM | POA: Insufficient documentation

## 2021-04-12 DIAGNOSIS — Z170 Estrogen receptor positive status [ER+]: Secondary | ICD-10-CM | POA: Diagnosis not present

## 2021-04-12 DIAGNOSIS — Z923 Personal history of irradiation: Secondary | ICD-10-CM | POA: Diagnosis not present

## 2021-04-12 DIAGNOSIS — Z79811 Long term (current) use of aromatase inhibitors: Secondary | ICD-10-CM | POA: Diagnosis not present

## 2021-04-12 DIAGNOSIS — C50311 Malignant neoplasm of lower-inner quadrant of right female breast: Secondary | ICD-10-CM

## 2021-04-12 NOTE — Progress Notes (Signed)
Radiation Oncology Follow up Note  Name: Amanda Hardin   Date:   04/12/2021 MRN:  [id number] DOB: Jul 13, 1956    This 65 y.o. female presents to the clinic today for 1 month follow-up status post whole breast radiation to her right breast for stage Ia (T1 cN0 M0) ER/PR positive HER2 negative invasive mammary carcinoma.  REFERRING PROVIDER: Derinda Late, MD  HPI: Patient is a 65 year old female now seen at 1 month having completed whole breast radiation to her right breast for stage Ia ER/PR positive HER2 negative invasive mammary carcinoma status post wide local excision and sentinel node biopsy seen today in routine follow-up she is doing well.  She specifically denies breast tenderness cough or bone pain.  She has been started on.  Femara is tolerating it well without side effect.  COMPLICATIONS OF TREATMENT: none  FOLLOW UP COMPLIANCE: keeps appointments   PHYSICAL EXAM:  BP (P) 140/63 (BP Location: Left Arm, Patient Position: Sitting)   Pulse (P) 82   Temp (!) (P) 96.7 F (35.9 C) (Tympanic)   Resp (P) 18   Wt (P) 166 lb 1.6 oz (75.3 kg)   BMI (P) 32.44 kg/m  Lungs are clear to A&P cardiac examination essentially unremarkable with regular rate and rhythm. No dominant mass or nodularity is noted in either breast in 2 positions examined. Incision is well-healed. No axillary or supraclavicular adenopathy is appreciated. Cosmetic result is excellent.  Still some slight hyperpigmentation of the skin.  Well-developed well-nourished patient in NAD. HEENT reveals PERLA, EOMI, discs not visualized.  Oral cavity is clear. No oral mucosal lesions are identified. Neck is clear without evidence of cervical or supraclavicular adenopathy. Lungs are clear to A&P. Cardiac examination is essentially unremarkable with regular rate and rhythm without murmur rub or thrill. Abdomen is benign with no organomegaly or masses noted. Motor sensory and DTR levels are equal and symmetric in the upper and lower  extremities. Cranial nerves II through XII are grossly intact. Proprioception is intact. No peripheral adenopathy or edema is identified. No motor or sensory levels are noted. Crude visual fields are within normal range. RADIOLOGY RESULTS: No current films for review  PLAN: Present time patient is recovering well from her whole breast radiation.  And pleased with her overall progress.  She continues on Femara without side effect.  I have asked to see her back in 4 to 5 months for follow-up.  Patient is to call with any concerns at any time.  I would like to take this opportunity to thank you for allowing me to participate in the care of your patient.Noreene Filbert, MD

## 2021-06-02 ENCOUNTER — Ambulatory Visit: Payer: Self-pay | Admitting: Obstetrics and Gynecology

## 2021-06-07 ENCOUNTER — Inpatient Hospital Stay: Payer: Medicare Other | Attending: Oncology | Admitting: Oncology

## 2021-06-07 ENCOUNTER — Other Ambulatory Visit: Payer: Self-pay | Admitting: Oncology

## 2021-06-07 ENCOUNTER — Encounter: Payer: Self-pay | Admitting: Oncology

## 2021-06-07 VITALS — BP 133/80 | HR 87 | Temp 97.4°F | Resp 20 | Wt 168.3 lb

## 2021-06-07 DIAGNOSIS — Z888 Allergy status to other drugs, medicaments and biological substances status: Secondary | ICD-10-CM | POA: Diagnosis not present

## 2021-06-07 DIAGNOSIS — Z8249 Family history of ischemic heart disease and other diseases of the circulatory system: Secondary | ICD-10-CM | POA: Insufficient documentation

## 2021-06-07 DIAGNOSIS — Z86018 Personal history of other benign neoplasm: Secondary | ICD-10-CM | POA: Diagnosis not present

## 2021-06-07 DIAGNOSIS — Z880 Allergy status to penicillin: Secondary | ICD-10-CM | POA: Diagnosis not present

## 2021-06-07 DIAGNOSIS — Z170 Estrogen receptor positive status [ER+]: Secondary | ICD-10-CM

## 2021-06-07 DIAGNOSIS — Z885 Allergy status to narcotic agent status: Secondary | ICD-10-CM | POA: Diagnosis not present

## 2021-06-07 DIAGNOSIS — Z833 Family history of diabetes mellitus: Secondary | ICD-10-CM | POA: Diagnosis not present

## 2021-06-07 DIAGNOSIS — Z90722 Acquired absence of ovaries, bilateral: Secondary | ICD-10-CM | POA: Insufficient documentation

## 2021-06-07 DIAGNOSIS — C50311 Malignant neoplasm of lower-inner quadrant of right female breast: Secondary | ICD-10-CM

## 2021-06-07 DIAGNOSIS — Z87891 Personal history of nicotine dependence: Secondary | ICD-10-CM | POA: Insufficient documentation

## 2021-06-07 DIAGNOSIS — Z79811 Long term (current) use of aromatase inhibitors: Secondary | ICD-10-CM | POA: Diagnosis not present

## 2021-06-07 DIAGNOSIS — Z79899 Other long term (current) drug therapy: Secondary | ICD-10-CM | POA: Insufficient documentation

## 2021-06-07 DIAGNOSIS — Z800 Family history of malignant neoplasm of digestive organs: Secondary | ICD-10-CM | POA: Diagnosis not present

## 2021-06-07 DIAGNOSIS — Z8042 Family history of malignant neoplasm of prostate: Secondary | ICD-10-CM | POA: Diagnosis not present

## 2021-06-07 NOTE — Progress Notes (Signed)
Patient here today for follow up regarding breast cancer. Patient reports tenderness under right arm.

## 2021-06-07 NOTE — Progress Notes (Signed)
Survivorship Care Plan visit completed.  Treatment summary reviewed and given to patient.  ASCO answers booklet reviewed and given to patient.  CARE program and Cancer Transitions discussed with patient along with other resources cancer center offers to patients and caregivers.  Patient verbalized understanding.    

## 2021-06-07 NOTE — Progress Notes (Signed)
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  Oncotype Dx score of 13 which is low risk.  INTERVAL HISTORY: Patient returns to clinic today for routine 5-monthevaluation and to assess her toleration of letrozole.  She is tolerating treatment well without significant side effects.  She has no neurologic complaints.  She denies any recent fevers or illnesses.  She has a good appetite and denies weight loss.  She has no chest pain, shortness of breath, cough, or hemoptysis.  She denies any nausea, vomiting, constipation, or diarrhea.  She has no urinary complaints.  Patient feels at her baseline offers no specific complaints today.  REVIEW OF SYSTEMS:   Review of Systems  Constitutional: Negative.  Negative for fever, malaise/fatigue and weight loss.  Respiratory: Negative.  Negative for cough, hemoptysis and shortness of breath.   Cardiovascular: Negative.  Negative for chest pain and leg swelling.  Gastrointestinal: Negative.  Negative for abdominal pain.  Genitourinary: Negative.  Negative for dysuria.  Musculoskeletal: Negative.  Negative for back pain.  Skin: Negative.  Negative for rash.  Neurological: Negative.  Negative for dizziness, focal weakness, weakness and headaches.  Psychiatric/Behavioral: Negative.  The patient is not nervous/anxious.    As per HPI. Otherwise, a  complete review of systems is negative.  PAST MEDICAL HISTORY: Past Medical History:  Diagnosis Date   Anxiety    Arthritis    Complication of anesthesia    Difficulty waking up.   Cough    Depression    GERD (gastroesophageal reflux disease)    Hyperlipidemia    Hypertension    Thyroid nodule    Torn meniscus    R     PAST SURGICAL HISTORY: Past Surgical History:  Procedure Laterality Date   BREAST BIOPSY Left 09[place]    BREAST BIOPSY Right    both core and excisional done, multiples for fibroadenomas   BREAST [facility]SY [facility]26/2020   [facility]  BREAST EXCISIONAL BIOPSY Left 2014   fibroadenomas   [facility]  BREAST LUMPECTOMY WITH NEEDLE LOCALIZATION AND AXILLARY SENTINEL LYMPH NODE BX Right 12/07/2020   Procedure: BREAST LUMPECTOMY WITH NEEDLE LOCALIZATION AND AXILLARY SENTINEL LYMPH NODE BX;  Surgeon: BRobert Bellow MD;  Location: [facility] ORS;  Service: General;  Laterality: Right;   CESAREAN SECTION     CYSTOSCOPY N/A 07/09/2018   Procedure: CYSTOSCOPY;  Surgeon: JWill Bonnet MD;  Location: [facility] ORS;  Service: Gynecology;  Laterality: N/A;   ROBOTIC ASSISTED LAPAROSCOPIC OVARIAN CYSTECTOMY Bilateral 07/09/2018   Procedure: ROBOTIC ASSISTED LAPAROSCOPIC OVARIAN CYSTECTOMY;  Surgeon: JWill Bonnet MD;  Location: [facility];  Service: Gynecology;  Laterality: Bilateral;   ROBOTIC ASSISTED TOTAL HYSTERECTOMY WITH BILATERAL SALPINGO OOPHERECTOMY Bilateral 07/09/2018   Procedure: ROBOTIC ASSISTED TOTAL HYSTERECTOMY WITH BILATERAL SALPINGO OOPHORECTOMY;  Surgeon: JWill Bonnet MD;  Location: [facility] OR[facility];  Service: Gynecology;  Laterality: Bilateral;   TONSILLECTOMY  FAMILY HISTORY: Family History  Problem Relation Age of Onset   Diabetes Maternal Aunt    Hypertension Maternal Aunt    Prostate cancer Father    Heart failure Father    Pancreatic cancer Paternal Aunt    Breast cancer Neg  Hx    Bladder Cancer Neg Hx    Kidney cancer Neg Hx     ADVANCED DIRECTIVES (Y/N):  N  HEALTH MAINTENANCE: Social History   Tobacco Use   Smoking status: Former    Years: 25.00    Pack years: 0.00    Types: Cigarettes    Quit date: 03/19/2002    Years since quitting: 19.2   Smokeless tobacco: Never  Vaping Use   Vaping Use: Never used  Substance Use Topics   Alcohol use: No   Drug use: No     Colonoscopy:  PAP:  Bone density:  Lipid panel:  Allergies  Allergen Reactions   Norco [Hydrocodone-Acetaminophen] Nausea And Vomiting   Penicillin G     Childhood allergy Has patient had a PCN reaction causing immediate rash, facial/tongue/throat swelling, SOB or lightheadedness with hypotension: Unknown Has patient had a PCN reaction causing severe rash involving mucus membranes or skin necrosis: Unknown Has patient had a PCN reaction that required hospitalization: Unknown Has patient had a PCN reaction occurring within the last 10 years: No If all of the above answers are "NO", then may proceed with Cephalosporin use.    Tape Rash    Current Outpatient Medications  Medication Sig Dispense Refill   DULoxetine (CYMBALTA) 30 MG capsule Take 30 mg by mouth daily. Take with 37m tab for a total of 90 mg     enalapril (VASOTEC) 10 MG tablet Take 10 mg by mouth daily.      hydrochlorothiazide (HYDRODIURIL) 12.5 MG tablet Take 12.5 mg by mouth daily.      meclizine (ANTIVERT) 25 MG tablet Take 25 mg by mouth daily as needed for dizziness.      pantoprazole (PROTONIX) 40 MG tablet Take 40 mg by mouth daily.     pravastatin (PRAVACHOL) 40 MG tablet Take 40 mg by mouth at bedtime.      pregabalin (LYRICA) 150 MG capsule Take 450 mg by mouth 2 (two) times daily.     valACYclovir (VALTREX) 500 MG tablet Take 500 mg by mouth daily.      DULoxetine (CYMBALTA) 60 MG capsule Take 60 mg by mouth daily. Take with 373mtab for a total of 90 mg     letrozole (FEMARA) 2.5 MG tablet TAKE 1  TABLET(2.5 MG) BY MOUTH DAILY 30 tablet 3   No current facility-administered medications for this visit.    OBJECTIVE: Vitals:   06/07/21 1103 06/07/21 1104  BP:  133/80  Pulse:  87  Resp: 20 20  Temp:  (!) 97.4 F (36.3 C)     Body mass index is 32.87 kg/m.    ECOG FS:0 - Asymptomatic  General: Well-developed, well-nourished, no acute distress. Eyes: Pink conjunctiva, anicteric sclera. HEENT: Normocephalic, moist mucous membranes. Breast: Exam deferred today. Lungs: No audible wheezing or coughing. Heart: Regular rate and rhythm. Abdomen: Soft, nontender, no obvious distention. Musculoskeletal: No edema, cyanosis, or clubbing. Neuro: Alert, answering all questions appropriately. Cranial nerves grossly intact. Skin: No rashes or petechiae noted. Psych: Normal affect.   LAB RESULTS:  Lab Results  Component Value Date   NA 141 11/29/2020   K 3.2 (L) 11/29/2020   CL 102 11/29/2020   CO2 30  11/29/2020   GLUCOSE 106 (H) 11/29/2020   BUN 15 11/29/2020   CREATININE 0.95 11/29/2020   CALCIUM 9.0 11/29/2020   PROT 6.9 07/09/2018   ALBUMIN 3.3 (L) 07/09/2018   AST 32 07/09/2018   ALT 12 07/09/2018   ALKPHOS 51 07/09/2018   BILITOT 0.4 07/09/2018   GFRNONAA >60 11/29/2020   GFRAA 58 (L) 07/10/2018    Lab Results  Component Value Date   WBC 4.9 03/02/2021   HGB 12.4 03/02/2021   HCT 38.8 03/02/2021   MCV 86.2 03/02/2021   PLT 242 03/02/2021     STUDIES: No results found.  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  Oncotype DX 13.  PLAN:    1. 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  Oncotype DX score is 13 which is considered low risk, therefore patient did not require adjuvant chemotherapy.  She completed adjuvant XRT on March 09, 2021.  Continue letrozole for  minimum of 5 years completing treatment in March 2027.  No further intervention is needed.  Return to clinic in 6 months for routine evaluation. 2. Bone health: Patient had a bone mineral density on March 20, 2021 which reported T score of -0.5 which is considered normal.  Repeat in April 2024.  Patient expressed understanding and was in agreement with this plan. She also understands that She can call clinic at any time with any questions, concerns, or complaints.   Cancer Staging Carcinoma of lower-inner quadrant of right breast [facility]) Staging form: Breast, AJCC 8th Edition - Pathologic stage from 12/23/2020: Stage IA (pT1c, pN0, cM0, G2, ER+, PR+, HER2-) - Signed by Lloyd Huger, MD on 12/23/2020 Stage prefix: Initial diagnosis Histologic grading system: 3 grade system   Lloyd Huger, MD   06/09/2021 12:19 PM

## 2021-06-30 ENCOUNTER — Ambulatory Visit: Payer: Medicare Other | Admitting: Obstetrics and Gynecology

## 2021-07-17 ENCOUNTER — Encounter: Payer: Self-pay | Admitting: Obstetrics and Gynecology

## 2021-07-17 ENCOUNTER — Ambulatory Visit (INDEPENDENT_AMBULATORY_CARE_PROVIDER_SITE_OTHER): Payer: Medicare Other | Admitting: Obstetrics and Gynecology

## 2021-07-17 ENCOUNTER — Other Ambulatory Visit: Payer: Self-pay

## 2021-07-17 VITALS — BP 140/70 | Ht 60.0 in | Wt 164.4 lb

## 2021-07-17 DIAGNOSIS — K5909 Other constipation: Secondary | ICD-10-CM | POA: Diagnosis not present

## 2021-07-17 DIAGNOSIS — R140 Abdominal distension (gaseous): Secondary | ICD-10-CM | POA: Diagnosis not present

## 2021-07-17 DIAGNOSIS — R61 Generalized hyperhidrosis: Secondary | ICD-10-CM

## 2021-07-17 NOTE — Progress Notes (Signed)
Routine Annual Gynecology Examination   PCP: Derinda Late, MD  Chief Complaint  Patient presents with   Gynecologic Exam    History of Present Illness: Patient is a 65 y.o. G1P0101 presents for annual exam. The patient has no complaints today.   Menopausal bleeding: denies. S/p hysterectomy  Menopausal symptoms: reports sweating (unclear if related, given recent history of breast cancer. See below)  Breast symptoms: denies  Last pap smear: 3 years ago.  Result: NILM, HPV+  Last mammogram:  diagnosis of breast cancer late last year, pathologic stage 1a ER/PR positive, HER-2 negative invasive carcinoma of right lower inner quadrant of breast. Oncotype Dx score of 13, which is low risk (per Oncology).   Patient has been getting hot flashes and is sweaty. She was diagnosed with breast cancer in 10/2020.  She had a lumpectomy and lymphadenectomy in 11/2020. She underwent radiation therapy for 2 months.  She has not undergone chemotherapy.  She has been [facility], Dr. Grayland Ormond.  Her surgeon was Dr. Bary Castilla.   Her sweating has been going on since the surgery.    She notes dark urine in the morning, "apple cider."  The rest of the day her urine is a more regular color.  She denies blood in her urine.    She also notes a bigger stomach.  She has had a colonoscopy. However, she doesn't remember the last time she had one.  She is not having bowel movements the way she used to. She doesn't have them every day. Her last one was 3 days ago.    Past Medical History:  Diagnosis Date   Anxiety    Arthritis    Complication of anesthesia    Difficulty waking up.   Cough    Depression    GERD (gastroesophageal reflux disease)    Hyperlipidemia    Hypertension    Thyroid nodule    Torn meniscus    R     Past Surgical History:  Procedure Laterality Date   BREAST BIOPSY Left 09/2015   [place] core bx  NEG    BREAST BIOPSY Right    both core and excisional done,  multiples for fibroadenomas   BREAST BIOPSY Right 10/26/2020   stereo [facility], clip, [facility]   BREAST EXCISIONAL BIOPSY Left 2014   fibroadenomas   BREAST LUM[facility]Y Right [facility]/07/2020   [facility]   BREAST LUMPECTOMY WITH NEEDLE LOCALIZATION AND AXILLARY SENTINEL LYMPH NODE BX Right 12/07/2020   Procedure: BREAST LUMPECTOMY WITH NEEDLE LOCALIZATION AND AXILLARY SENTINEL LYMPH NODE BX;  Surgeon: Robert Bellow, MD;  Location: [facility] ORS;  Service: General;  Laterality: Right;   CESAREAN SECTION     CYSTOSCOPY N/A 07/09/2018   Procedure: CYSTOSCOPY;  Surgeon: Will Bonnet, MD;  Location: [facility] ORS;  Service: Gynecology;  Laterality: N/A;   ROBOTIC ASSISTED LAPAROSCOPIC OVARIAN CYSTECTOMY Bilateral 07/09/2018   Procedure: ROBOTIC ASSISTED LAPAROSCOPIC OVARIAN CYSTECTOMY;  Surgeon: Will Bonnet, MD;  Location: [facility] ORS;  Service: Gynecology;  Laterality: Bilateral;   ROBOTIC ASSISTED TOTAL HYSTERECTOMY WITH BILATERAL SALPINGO OOPHERECTOMY Bilateral 07/09/2018   Procedure: ROBOTIC ASSISTED TOTAL HYSTERECTOMY WITH BILATERAL SALPINGO OOPHORECTOMY;  Surgeon: Will Bonnet, MD;  Location: [facility] ORS;  Service: Gynecology;  Laterality: Bilateral;   TONSILLECTOMY      Prior to Admission medications   Medication Sig Start Date End Date Taking? Authorizing Provider  DULoxetine (CYMBALTA) 30 MG capsule Take 30 mg by mouth daily. Take with 86m tab for a total of  90 mg 07/26/20   [provider]  DULoxetine (CYMBALTA) 60 MG capsule Take 60 mg by mouth daily. Take with $RemoveB'30mg'VCCXSznt$  tab for a total of 90 mg 04/23/19 04/12/21  [provider]  enalapril (VASOTEC) 10 MG tablet Take 10 mg by mouth daily.     [provider]  hydrochlorothiazide (HYDRODIURIL) 12.5 MG tablet Take 12.5 mg by mouth daily.  08/02/20 08/02/21  [provider]  letrozole (FEMARA) 2.5 MG tablet TAKE 1 TABLET(2.5 MG) BY MOUTH DAILY 06/07/21   Lloyd Huger, MD  meclizine (ANTIVERT) 25 MG tablet Take 25 mg by  mouth daily as needed for dizziness.  04/19/16   [provider]  pantoprazole (PROTONIX) 40 MG tablet Take 40 mg by mouth daily.    [provider]  pravastatin (PRAVACHOL) 40 MG tablet Take 40 mg by mouth at bedtime.     [provider]  pregabalin (LYRICA) 150 MG capsule Take 450 mg by mouth 2 (two) times daily.    [provider]  valACYclovir (VALTREX) 500 MG tablet Take 500 mg by mouth daily.  01/19/16   [provider]    Allergies  Allergen Reactions   Norco [Hydrocodone-Acetaminophen] Nausea And Vomiting   Penicillin G     Childhood allergy Has patient had a PCN reaction causing immediate rash, facial/tongue/throat swelling, SOB or lightheadedness with hypotension: Unknown Has patient had a PCN reaction causing severe rash involving mucus membranes or skin necrosis: Unknown Has patient had a PCN reaction that required hospitalization: Unknown Has patient had a PCN reaction occurring within the last 10 years: No If all of the above answers are "NO", then may proceed with Cephalosporin use.    Tape Rash    Obstetric History: G1P0101  Social History   Socioeconomic History   Marital status: Single    Spouse name: Not on file   Number of children: Not on file   Years of education: Not on file   Highest education level: Not on file  Occupational History   Not on file  Tobacco Use   Smoking status: Former    Years: 25.00    Types: Cigarettes    Quit date: 03/19/2002    Years since quitting: 19.3   Smokeless tobacco: Never  Vaping Use   Vaping Use: Never used  Substance and Sexual Activity   Alcohol use: No   Drug use: No   Sexual activity: Not Currently    Birth control/protection: Post-menopausal  Other Topics Concern   Not on file  Social History Narrative   Not on file   Social Determinants of Health   Financial Resource Strain: Not on file  Food Insecurity: Not on file  Transportation Needs: Not on file  Physical  Activity: Not on file  Stress: Not on file  Social Connections: Not on file  Intimate Partner Violence: Not on file    Family History  Problem Relation Age of Onset   Diabetes Maternal Aunt    Hypertension Maternal Aunt    Prostate cancer Father    Heart failure Father    Pancreatic cancer Paternal Aunt    Breast cancer Neg Hx    Bladder Cancer Neg Hx    Kidney cancer Neg Hx     Review of Systems  Constitutional:  Positive for diaphoresis. Negative for chills, fever, malaise/fatigue and weight loss.       +night sweats  HENT: Negative.    Eyes:  Positive for blurred vision. Negative for double  vision, photophobia, pain, discharge and redness.  Respiratory:  Positive for shortness of breath (not today). Negative for cough, hemoptysis, sputum production and wheezing.   Cardiovascular:  Positive for chest pain (not today). Negative for palpitations, orthopnea, claudication, leg swelling and PND.  Gastrointestinal:  Positive for constipation. Negative for abdominal pain, blood in stool, diarrhea, heartburn, melena, nausea and vomiting.       +bloating  Genitourinary: Negative.   Musculoskeletal:  Positive for joint pain. Negative for back pain, falls, myalgias and neck pain.  Skin: Negative.   Neurological:  Positive for dizziness. Negative for tingling, tremors, sensory change, speech change, focal weakness, seizures, loss of consciousness, weakness and headaches.  Psychiatric/Behavioral:  Positive for depression (on medication). Negative for hallucinations, memory loss, substance abuse and suicidal ideas. The patient is not nervous/anxious and does not have insomnia.     Physical Exam Vitals: BP 140/70   Ht 5' (1.524 m)   Wt 164 lb 6.4 oz (74.6 kg)   BMI 32.11 kg/m   Physical Exam Constitutional:      General: She is not in acute distress.    Appearance: Normal appearance. She is well-developed.  Genitourinary:     Genitourinary Comments: Patient declines  Breasts:     Breast exam comments: deferred. HENT:     Head: Normocephalic and atraumatic.  Eyes:     General: No scleral icterus.    Conjunctiva/sclera: Conjunctivae normal.  Cardiovascular:     Rate and Rhythm: Normal rate and regular rhythm.     Heart sounds: No murmur heard.   No friction rub. No gallop.  Pulmonary:     Effort: Pulmonary effort is normal. No respiratory distress.     Breath sounds: Normal breath sounds. No wheezing or rales.  Abdominal:     General: Bowel sounds are normal. There is no distension.     Palpations: Abdomen is soft. There is no mass.     Tenderness: There is no abdominal tenderness. There is no guarding or rebound.  Musculoskeletal:        General: Normal range of motion.     Cervical back: Normal range of motion and neck supple.  Neurological:     General: No focal deficit present.     Mental Status: She is alert and oriented to person, place, and time.     Cranial Nerves: No cranial nerve deficit.  Skin:    General: Skin is warm and dry.     Findings: No erythema.  Psychiatric:        Mood and Affect: Mood normal.        Behavior: Behavior normal.        Judgment: Judgment normal.     Female chaperone present for pelvic and breast  portions of the physical exam   Assessment and Plan:  65 y.o. G57P0101 female here for  1. Night sweats   2. Abdominal bloating   3. Other constipation      Plan: Problem List Items Addressed This Visit   None Visit Diagnoses     Night sweats    -  Primary   Abdominal bloating       Other constipation          Given her recent diagnosis of breast cancer, even though low risk and stage 1a, I recommended that she contact her oncologist to discuss her symptoms, as some of these symptoms can be concerning for cancer.  If there is no concern from her oncologist, I recommend she  discuss these symptoms with her PCP.  I also recommended that she discuss with her surgeon and/or oncologist who should be doing her breast  exams, as either her oncologist or surgeon will be ordering breast imaging for the near future.    I also recommended she get a colonoscopy as a general screening measure after talking with her oncologist about her new bloating and constipation symptoms.    She declines a pelvic exam today and I did recommend one since her last pap smear was HPV+.  I would recommend a follow up pap smear soon (it has been 3 years since her last).  She voiced understanding and agreement with my recommendations.   A total of 24 minutes were spent face-to-face with the patient as well as preparation, review, communication, and documentation during this encounter.    Prentice Docker, MD 07/17/2021 1:47 PM

## 2021-07-18 ENCOUNTER — Encounter: Payer: Self-pay | Admitting: Obstetrics and Gynecology

## 2021-08-01 ENCOUNTER — Other Ambulatory Visit: Payer: Self-pay

## 2021-08-01 ENCOUNTER — Encounter: Payer: Medicare Other | Attending: Family Medicine

## 2021-08-01 VITALS — Ht 61.0 in | Wt 165.2 lb

## 2021-08-01 DIAGNOSIS — Z170 Estrogen receptor positive status [ER+]: Secondary | ICD-10-CM

## 2021-08-01 NOTE — Progress Notes (Signed)
CARE Daily Session Note  Patient Details  Name: Amanda Hardin MRN: [id number] Date of Birth: [birth year] Referring Provider:    Encounter Date: 08/01/2021  Check In:  Session Check In - 08/01/21 1359       Check-In   Supervising physician immediately available to respond to emergencies See telemetry face sheet for immediately available ER MD    Location [facility]    Staff Present Birdie Sons, MPA, RN;Melissa Agua Dulce, RDN, Tawanna Solo, MS, ASCM CEP, Exercise Physiologist    Virtual Visit No    Medication changes reported     No    Fall or balance concerns reported    No    Warm-up and Cool-down Not performed (comment)   Gym Orientation   Resistance Training Performed No    VAD Patient? No    PAD/SET Patient? No               Social History   Tobacco Use  Smoking Status Former   Years: 25.00   Types: Cigarettes   Quit date: 03/19/2002   Years since quitting: 19.3  Smokeless Tobacco Never    Goals Met:  Personal goals reviewed No report of cardiac concerns or symptoms  Goals Unmet:  Not Applicable  Comments: First full day of orientation. Patient did wear open-toes shoes and was unable to complete 6MWT today. The remaining portion of orientation was completed. Will finish 6MWT on Thursday, 8/18.  Dr. Emily Filbert is Medical Director for [place].  Dr. Ottie Glazier is Medical Director for [facility].

## 2021-08-03 ENCOUNTER — Other Ambulatory Visit: Payer: Self-pay

## 2021-08-03 DIAGNOSIS — Z170 Estrogen receptor positive status [ER+]: Secondary | ICD-10-CM

## 2021-08-03 DIAGNOSIS — C50311 Malignant neoplasm of lower-inner quadrant of right female breast: Secondary | ICD-10-CM

## 2021-08-03 NOTE — Progress Notes (Signed)
CARE Daily Session Note  Patient Details  Name: Amanda Hardin MRN: [id number] Date of Birth: 1956-07-27 Referring Provider:   [name] Cancer Associated Rehabilitation & Exercise from 08/03/2021 in [facility]  Referring Provider Delight Hoh MD       Encounter Date: 08/03/2021  Check In:  Session Check In - 08/03/21 1420       Check-In   Supervising physician immediately available to respond to emergencies See telemetry face sheet for immediately available ER MD    Location [facility]    Staff Present Birdie Sons, MPA, Nino Glow, MS, ASCM CEP, Exercise Physiologist;Amanda Oletta Darter, BA, ACSM CEP, Exercise Physiologist    Virtual Visit No    Medication changes reported     No    Fall or balance concerns reported    No    Warm-up and Cool-down Performed on first and last piece of equipment    Resistance Training Performed Yes    VAD Patient? No              6 Minute Walk     Row Name 08/03/21 1420         6 Minute Walk   Phase Initial     Distance 1175 feet     Walk Time 6 minutes     # of Rest Breaks 0     MPH 2.22     METS 2.8     RPE 11     Perceived Dyspnea  1     VO2 Peak 9.83     Symptoms No     Resting HR 88 bpm     Resting BP 132/64     Resting Oxygen Saturation  94 %     Exercise Oxygen Saturation  during 6 min walk 91 %     Max Ex. HR 126 bpm     Max Ex. BP 152/64     2 Minute Post BP 124/62                Exercise Prescription Changes - 08/03/21 1400       Response to Exercise   Blood Pressure (Admit) 132/64    Blood Pressure (Exercise) 152/64    Blood Pressure (Exit) 124/62    Heart Rate (Admit) 88 bpm    Heart Rate (Exercise) 110 bpm    Heart Rate (Exit) 88 bpm    Oxygen Saturation (Admit) 94 %    Oxygen Saturation (Exercise) 91 %    Oxygen Saturation (Exit) 95 %    Rating of Perceived Exertion (Exercise) 11    Perceived Dyspnea (Exercise) 1    Symptoms none     Comments walk test results             Social History   Tobacco Use  Smoking Status Former   Years: 25.00   Types: Cigarettes   Quit date: 03/19/2002   Years since quitting: 19.3  Smokeless Tobacco Never    Goals Met:  Proper associated with RPD/PD & O2 Sat Exercise tolerated well Personal goals reviewed No report of cardiac concerns or symptoms Strength training completed today  Goals Unmet:  Not Applicable  Comments: First full day of exercise!  Patient was oriented to gym and equipment including functions, settings, policies, and procedures.  Patient's individual exercise prescription and treatment plan were reviewed.  All starting workloads were established based on the results of the 6 minute walk test done at initial orientation visit.  The plan for exercise progression was also introduced and progression will be customized based on patient's performance and goals.    Dr. Emily Filbert is Medical Director for [place].  Dr. Ottie Glazier is Medical Director for [facility] Pulmonary Rehabilitation.

## 2021-08-08 ENCOUNTER — Other Ambulatory Visit: Payer: Self-pay

## 2021-08-08 DIAGNOSIS — C50311 Malignant neoplasm of lower-inner quadrant of right female breast: Secondary | ICD-10-CM

## 2021-08-08 DIAGNOSIS — Z170 Estrogen receptor positive status [ER+]: Secondary | ICD-10-CM

## 2021-08-08 NOTE — Progress Notes (Signed)
Daily Session Note  Patient Details  Name: Jaira Canady MRN: [id number] Date of Birth: July 22, 1956 Referring Provider:   [name] Cancer Associated Rehabilitation & Exercise from 08/03/2021 in [facility] Cardiac and Pulmonary Rehab  Referring Provider Delight Hoh MD       Encounter Date: 08/08/2021  Check In:  Session Check In - 08/08/21 1342       Check-In   Supervising physician immediately available to respond to emergencies See telemetry face sheet for immediately available ER MD    Location [facility]-Cardiac & Pulmonary Rehab    Staff Present Alberteen Sam, MA, RCEP, CCRP, Marylynn Pearson, MS, ASCM CEP, Exercise Physiologist;Kelly Rosalia Hammers, MPA, RN    Virtual Visit No    Medication changes reported     No    Warm-up and Cool-down Performed on first and last piece of equipment    Resistance Training Performed Yes    VAD Patient? No    PAD/SET Patient? No                Social History   Tobacco Use  Smoking Status Former   Years: 25.00   Types: Cigarettes   Quit date: 03/19/2002   Years since quitting: 19.4  Smokeless Tobacco Never    Goals Met:  Independence with exercise equipment Exercise tolerated well No report of cardiac concerns or symptoms Strength training completed today  Goals Unmet:  Not Applicable  Comments: Pt able to follow exercise prescription today without complaint.  Will continue to monitor for progression.    Dr. Emily Filbert is Medical Director for [place].  Dr. Ottie Glazier is Medical Director for [facility] Pulmonary Rehabilitation.

## 2021-08-10 ENCOUNTER — Other Ambulatory Visit: Payer: Self-pay

## 2021-08-10 DIAGNOSIS — Z170 Estrogen receptor positive status [ER+]: Secondary | ICD-10-CM

## 2021-08-10 DIAGNOSIS — C50311 Malignant neoplasm of lower-inner quadrant of right female breast: Secondary | ICD-10-CM

## 2021-08-10 NOTE — Progress Notes (Signed)
CARE Daily Session Note  Patient Details  Name: Amanda Hardin MRN: [id number] Date of Birth: April 14, 1956 Referring Provider:   [name] Cancer Associated Rehabilitation & Exercise from 08/03/2021 in [facility] Cardiac and Pulmonary Rehab  Referring Provider Delight Hoh MD       Encounter Date: 08/10/2021  Check In:  Session Check In - 08/10/21 1240       Check-In   Supervising physician immediately available to respond to emergencies See telemetry face sheet for immediately available ER MD    Location [facility]-Cardiac & Pulmonary Rehab    Staff Present Nada Maclachlan, BA, ACSM CEP, Exercise Physiologist;Shakara Tweedy Eliezer Bottom, MS, ASCM CEP, Exercise Physiologist    Virtual Visit No    Medication changes reported     No    Fall or balance concerns reported    Yes    Comments No injuries reported    Warm-up and Cool-down Performed on first and last piece of equipment    Resistance Training Performed No   yoga   VAD Patient? No    PAD/SET Patient? No                Social History   Tobacco Use  Smoking Status Former   Years: 25.00   Types: Cigarettes   Quit date: 03/19/2002   Years since quitting: 19.4  Smokeless Tobacco Never    Goals Met:  Proper associated with RPD/PD & O2 Sat Exercise tolerated well No report of concerns or symptoms today  Goals Unmet:  Not Applicable  Comments: Pt able to follow exercise prescription today without complaint.  Will continue to monitor for progression.   Yoga completed today by Nada Maclachlan, Exercise Physiologist.   Dr. Emily Filbert is Medical Director for [place].  Dr. Ottie Glazier is Medical Director for [facility] Pulmonary Rehabilitation.

## 2021-08-17 ENCOUNTER — Encounter: Payer: Medicare Other | Attending: Oncology

## 2021-08-17 ENCOUNTER — Other Ambulatory Visit: Payer: Self-pay

## 2021-08-17 DIAGNOSIS — C50311 Malignant neoplasm of lower-inner quadrant of right female breast: Secondary | ICD-10-CM | POA: Insufficient documentation

## 2021-08-17 DIAGNOSIS — Z170 Estrogen receptor positive status [ER+]: Secondary | ICD-10-CM | POA: Insufficient documentation

## 2021-08-17 NOTE — Progress Notes (Signed)
Daily Session Note  Patient Details  Name: Amanda Hardin MRN: [id number] Date of Birth: Mar 29, 1956 Referring Provider:   [name] Cancer Associated Rehabilitation & Exercise from 08/03/2021 in [facility] Cardiac and Pulmonary Rehab  Referring Provider Delight Hoh MD       Encounter Date: 08/17/2021  Check In:  Session Check In - 08/17/21 1213       Check-In   Supervising physician immediately available to respond to emergencies See telemetry face sheet for immediately available ER MD    Location [facility]-Cardiac & Pulmonary Rehab    Staff Present Birdie Sons, MPA, RN;Melissa [place], RDN, Rowe Pavy, BA, ACSM CEP, Exercise Physiologist    Virtual Visit No    Medication changes reported     No    Fall or balance concerns reported    No    Warm-up and Cool-down Performed on first and last piece of equipment    Resistance Training Performed Yes    VAD Patient? No    PAD/SET Patient? No      Pain Assessment   Currently in Pain? No/denies                Social History   Tobacco Use  Smoking Status Former   Years: 25.00   Types: Cigarettes   Quit date: 03/19/2002   Years since quitting: 19.4  Smokeless Tobacco Never    Goals Met:  Independence with exercise equipment Exercise tolerated well No report of concerns or symptoms today Strength training completed today  Goals Unmet:  Not Applicable  Comments: Pt able to follow exercise prescription today without complaint.  Will continue to monitor for progression.    Dr. Emily Filbert is Medical Director for [place].  Dr. Ottie Glazier is Medical Director for [facility] Pulmonary Rehabilitation.

## 2021-08-22 ENCOUNTER — Encounter: Payer: Medicare Other | Admitting: *Deleted

## 2021-08-22 ENCOUNTER — Other Ambulatory Visit: Payer: Self-pay

## 2021-08-22 DIAGNOSIS — Z170 Estrogen receptor positive status [ER+]: Secondary | ICD-10-CM

## 2021-08-22 NOTE — Progress Notes (Signed)
Daily Session Note  Patient Details  Name: Amanda Hardin MRN: [id number] Date of Birth: May 31, 1956 Referring Provider:   [name] Cancer Associated Rehabilitation & Exercise from 08/03/2021 in [facility] Cardiac and Pulmonary Rehab  Referring Provider Delight Hoh MD       Encounter Date: 08/22/2021  Check In:  Session Check In - 08/22/21 1248       Check-In   Staff Present Alberteen Sam, MA, RCEP, CCRP, CCET;Laureen [employer], BS, RRT, CPFT    Virtual Visit No    Medication changes reported     No    Fall or balance concerns reported    No    Warm-up and Cool-down Performed on first and last piece of equipment    Resistance Training Performed Yes    VAD Patient? No    PAD/SET Patient? No      Pain Assessment   Currently in Pain? No/denies                Social History   Tobacco Use  Smoking Status Former   Years: 25.00   Types: Cigarettes   Quit date: 03/19/2002   Years since quitting: 19.4  Smokeless Tobacco Never    Goals Met:  Proper associated with RPD/PD & O2 Sat Independence with exercise equipment Exercise tolerated well No report of concerns or symptoms today Strength training completed today  Goals Unmet:  Not Applicable  Comments: Pt able to follow exercise prescription today without complaint.  Will continue to monitor for progression.    Dr. Emily Filbert is Medical Director for [place].  Dr. Ottie Glazier is Medical Director for [facility] Pulmonary Rehabilitation.

## 2021-08-24 ENCOUNTER — Other Ambulatory Visit: Payer: Self-pay

## 2021-08-24 DIAGNOSIS — C50311 Malignant neoplasm of lower-inner quadrant of right female breast: Secondary | ICD-10-CM

## 2021-08-24 DIAGNOSIS — Z170 Estrogen receptor positive status [ER+]: Secondary | ICD-10-CM

## 2021-08-24 NOTE — Progress Notes (Signed)
CARE Daily Session Note  Patient Details  Name: Amanda Hardin MRN: [id number] Date of Birth: Jun 24, 1956 Referring Provider:   [name] Cancer Associated Rehabilitation & Exercise from 08/03/2021 in [facility] Cardiac and Pulmonary Rehab  Referring Provider Delight Hoh MD       Encounter Date: 08/24/2021  Check In:  Session Check In - 08/24/21 1252       Check-In   Supervising physician immediately available to respond to emergencies See telemetry face sheet for immediately available ER MD    Location [facility]-Cardiac & Pulmonary Rehab    Staff Present Nada Maclachlan, BA, ACSM CEP, Exercise Physiologist;Pope Brunty Eliezer Bottom, MS, ASCM CEP, Exercise Physiologist    Virtual Visit No    Medication changes reported     No    Fall or balance concerns reported    No    Warm-up and Cool-down Performed on first and last piece of equipment    Resistance Training Performed Yes    VAD Patient? No    PAD/SET Patient? No                Social History   Tobacco Use  Smoking Status Former   Years: 25.00   Types: Cigarettes   Quit date: 03/19/2002   Years since quitting: 19.4  Smokeless Tobacco Never    Goals Met:  Independence with exercise equipment Exercise tolerated well No report of concerns or symptoms today Strength training completed today  Goals Unmet:  Not Applicable  Comments: Pt able to follow exercise prescription today without complaint.  Will continue to monitor for progression.    Dr. Emily Filbert is Medical Director for [place].  Dr. Ottie Glazier is Medical Director for [facility] Pulmonary Rehabilitation.

## 2021-08-31 ENCOUNTER — Other Ambulatory Visit: Payer: Self-pay

## 2021-08-31 DIAGNOSIS — C50311 Malignant neoplasm of lower-inner quadrant of right female breast: Secondary | ICD-10-CM

## 2021-08-31 DIAGNOSIS — Z170 Estrogen receptor positive status [ER+]: Secondary | ICD-10-CM

## 2021-08-31 NOTE — Progress Notes (Signed)
Daily Session Note  Patient Details  Name: Amanda Hardin MRN: [id number] Date of Birth: [birth year] Referring Provider:   [name] Cancer Associated Rehabilitation & Exercise from 08/03/2021 in [facility] Cardiac and Pulmonary Rehab  Referring Provider Delight Hoh MD       Encounter Date: 08/31/2021  Check In:  Session Check In - 08/31/21 1216       Check-In   Supervising physician immediately available to respond to emergencies See telemetry face sheet for immediately available ER MD    Location [facility]-Cardiac & Pulmonary Rehab    Staff Present Birdie Sons, MPA, Nino Glow, MS, ASCM CEP, Exercise Physiologist;Amanda Oletta Darter, BA, ACSM CEP, Exercise Physiologist    Virtual Visit No    Medication changes reported     No    Fall or balance concerns reported    No    Warm-up and Cool-down Performed on first and last piece of equipment    Resistance Training Performed Yes    VAD Patient? No    PAD/SET Patient? No      Pain Assessment   Currently in Pain? No/denies                Social History   Tobacco Use  Smoking Status Former   Years: 25.00   Types: Cigarettes   Quit date: 03/19/2002   Years since quitting: 19.4  Smokeless Tobacco Never    Goals Met:  Independence with exercise equipment Exercise tolerated well No report of concerns or symptoms today Strength training completed today  Goals Unmet:  Not Applicable  Comments: Pt able to follow exercise prescription today without complaint.  Will continue to monitor for progression.    Dr. Emily Filbert is Medical Director for [place].  Dr. Ottie Glazier is Medical Director for [facility] Pulmonary Rehabilitation.

## 2021-09-07 ENCOUNTER — Other Ambulatory Visit: Payer: Self-pay

## 2021-09-07 DIAGNOSIS — Z170 Estrogen receptor positive status [ER+]: Secondary | ICD-10-CM

## 2021-09-07 NOTE — Progress Notes (Signed)
Daily Session Note  Patient Details  Name: Amanda Hardin MRN: [id number] Date of Birth: [birth year] Referring Provider:   [name] Cancer Associated Rehabilitation & Exercise from 08/03/2021 in [facility] Cardiac and Pulmonary Rehab  Referring Provider Delight Hoh MD       Encounter Date: 09/07/2021  Check In:  Session Check In - 09/07/21 1248       Check-In   Supervising physician immediately available to respond to emergencies See telemetry face sheet for immediately available ER MD    Location [facility]-Cardiac & Pulmonary Rehab    Staff Present Birdie Sons, MPA, RN;Melissa Somis, RDN, LDN;Meredith Sherryll Burger, RN Vickki Hearing, BA, ACSM CEP, Exercise Physiologist    Virtual Visit No    Medication changes reported     No    Fall or balance concerns reported    No    Warm-up and Cool-down Performed on first and last piece of equipment    Resistance Training Performed Yes    VAD Patient? No    PAD/SET Patient? No      Pain Assessment   Currently in Pain? No/denies                Social History   Tobacco Use  Smoking Status Former   Years: 25.00   Types: Cigarettes   Quit date: 03/19/2002   Years since quitting: 19.4  Smokeless Tobacco Never    Goals Met:  Independence with exercise equipment Exercise tolerated well No report of concerns or symptoms today Strength training completed today  Goals Unmet:  Not Applicable  Comments: Pt able to follow exercise prescription today without complaint.  Will continue to monitor for progression.    Dr. Emily Filbert is Medical Director for [place].  Dr. Ottie Glazier is Medical Director for [facility] Pulmonary Rehabilitation.

## 2021-09-08 ENCOUNTER — Other Ambulatory Visit: Payer: Self-pay | Admitting: General Surgery

## 2021-09-08 DIAGNOSIS — C50311 Malignant neoplasm of lower-inner quadrant of right female breast: Secondary | ICD-10-CM

## 2021-09-13 ENCOUNTER — Ambulatory Visit
Admission: RE | Admit: 2021-09-13 | Discharge: 2021-09-13 | Disposition: A | Discharge status: Home / Self Care | Payer: Medicare Other | Source: Ambulatory Visit | Attending: Radiation Oncology | Admitting: Radiation Oncology

## 2021-09-13 ENCOUNTER — Encounter: Payer: Self-pay | Admitting: Radiation Oncology

## 2021-09-13 VITALS — BP 125/71 | HR 76 | Temp 97.5°F | Resp 20 | Wt 162.4 lb

## 2021-09-13 DIAGNOSIS — Z170 Estrogen receptor positive status [ER+]: Secondary | ICD-10-CM | POA: Diagnosis not present

## 2021-09-13 DIAGNOSIS — Z79811 Long term (current) use of aromatase inhibitors: Secondary | ICD-10-CM | POA: Insufficient documentation

## 2021-09-13 DIAGNOSIS — C50211 Malignant neoplasm of upper-inner quadrant of right female breast: Secondary | ICD-10-CM | POA: Insufficient documentation

## 2021-09-13 DIAGNOSIS — Z923 Personal history of irradiation: Secondary | ICD-10-CM | POA: Diagnosis not present

## 2021-09-13 DIAGNOSIS — C50311 Malignant neoplasm of lower-inner quadrant of right female breast: Secondary | ICD-10-CM

## 2021-09-13 NOTE — Progress Notes (Signed)
Radiation Oncology Follow up Note  Name: Amanda Hardin   Date:   09/13/2021 MRN:  [id number] DOB: [birth year]    This 65 y.o. 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  REFERRING PROVIDER: Derinda Late, MD  HPI: Patient is a 65 year old female now out 6 months having completed whole breast radiation to her right breast for stage Ia ER positive invasive mammary carcinoma seen today in routine follow-up she is doing well.  She specifically denies breast tenderness cough or bone pain..  She has not yet had a mammogram.  She is currently on Femara tolerating it well without side effect.  She has not yet seen Dr. Tollie Pizza I have setting up a follow-up appoint with him  COMPLICATIONS OF TREATMENT: none  FOLLOW UP COMPLIANCE: keeps appointments   PHYSICAL EXAM:  BP 125/71   Pulse 76   Temp (!) 97.5 F (36.4 C) (Tympanic)   Resp 20   Wt 162 lb 6.4 oz (73.7 kg)   BMI 30.69 kg/m  Lungs are clear to A&P cardiac examination essentially unremarkable with regular rate and rhythm. No dominant mass or nodularity is noted in either breast in 2 positions examined. Incision is well-healed. No axillary or supraclavicular adenopathy is appreciated. Cosmetic result is excellent.  Well-developed well-nourished patient in NAD. HEENT reveals PERLA, EOMI, discs not visualized.  Oral cavity is clear. No oral mucosal lesions are identified. Neck is clear without evidence of cervical or supraclavicular adenopathy. Lungs are clear to A&P. Cardiac examination is essentially unremarkable with regular rate and rhythm without murmur rub or thrill. Abdomen is benign with no organomegaly or masses noted. Motor sensory and DTR levels are equal and symmetric in the upper and lower extremities. Cranial nerves II through XII are grossly intact. Proprioception is intact. No peripheral  adenopathy or edema is identified. No motor or sensory levels are noted. Crude visual fields are within normal range.  RADIOLOGY RESULTS: No current films for review  PLAN: Present time patient is doing well with no evidence of disease 6 months out she clinically is doing well with low side effect profile.  I am pleased with her overall progress.  Of asked to see her back in 6 months for follow-up.  Patient knows to call with any concerns.  We also setting up a follow-up appoint with Dr. Tollie Pizza.  I would like to take this opportunity to thank you for allowing me to participate in the care of your patient.Noreene Filbert, MD

## 2021-09-14 IMAGING — MG MM PLC BREAST LOC DEV 1ST LESION INC*R*
7 series · 7 of 7 positions shown · non-contrast
Comparison: Previous exams.

CLINICAL DATA: 64-year-old female with recently diagnosed invasive
right breast cancer. Patient presents for localization prior to same
day surgical excision.

EXAM:
NEEDLE LOCALIZATION OF THE RIGHT BREAST WITH MAMMO GUIDANCE

[R CC (1 of 4)]
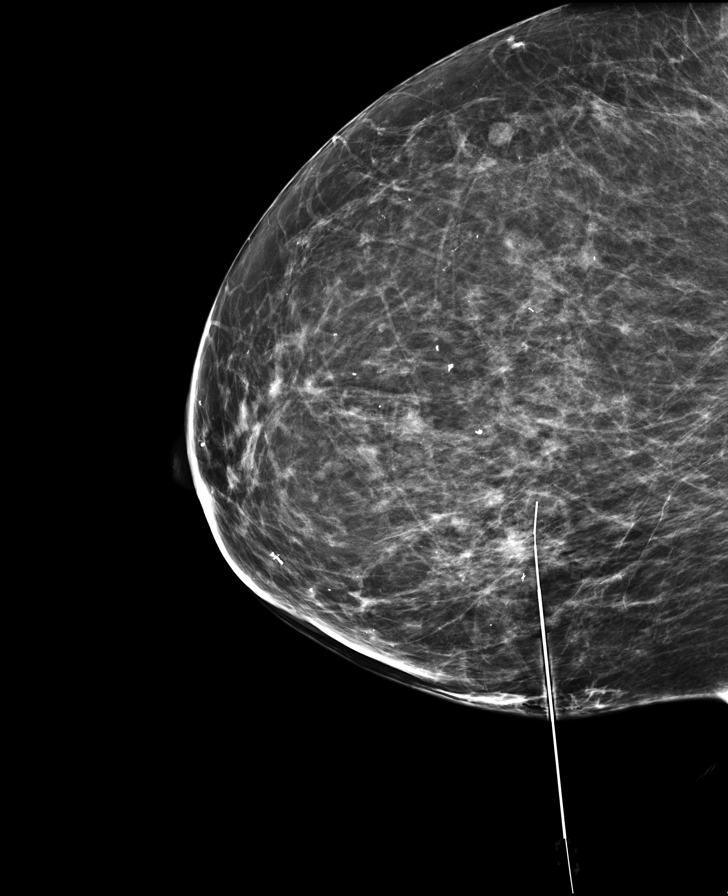

[R ML (1 of 3)]
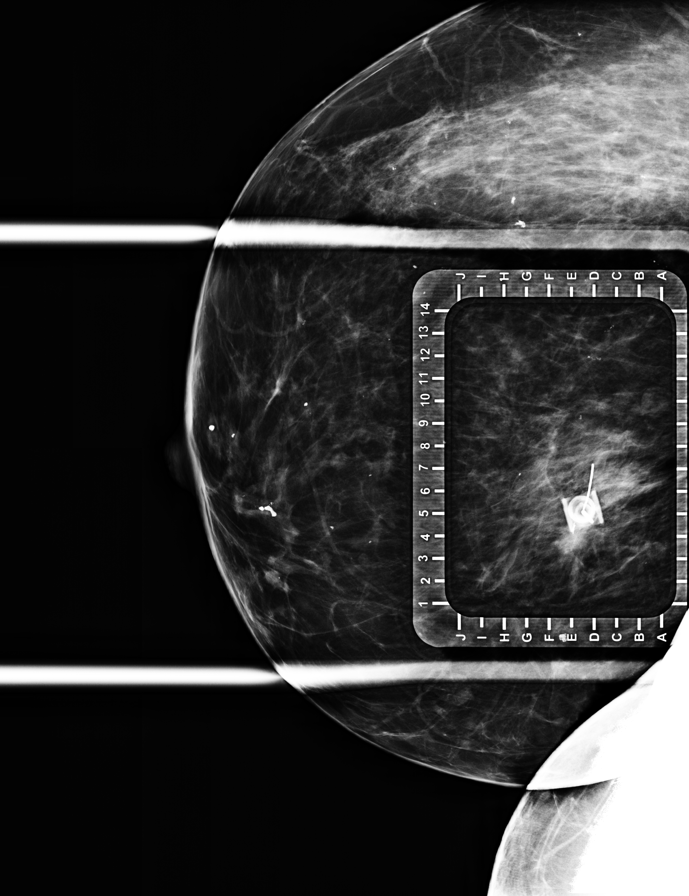

[R CC (2 of 4)]
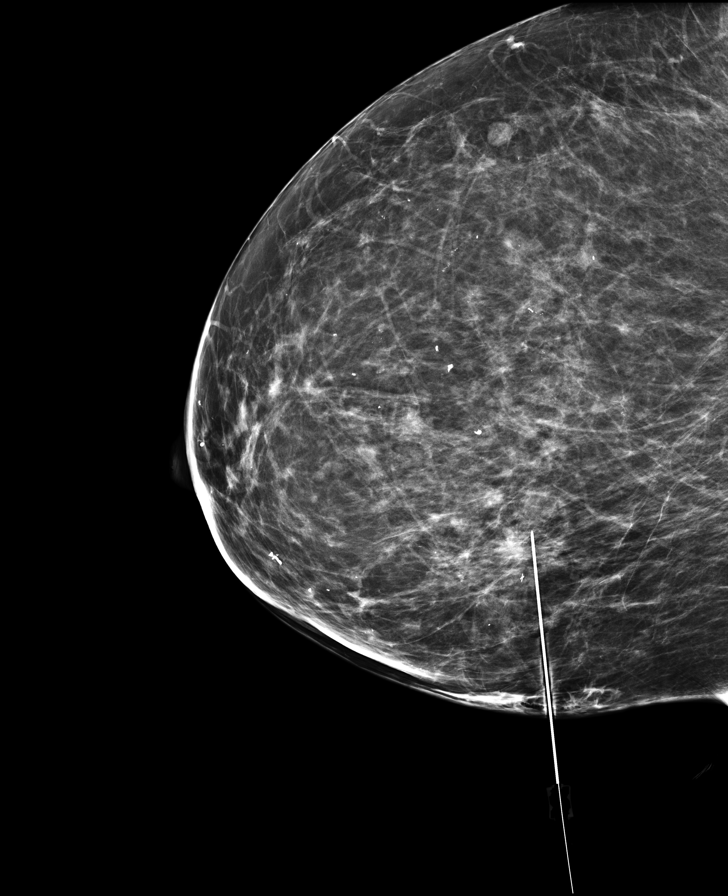

[R CC (3 of 4)]
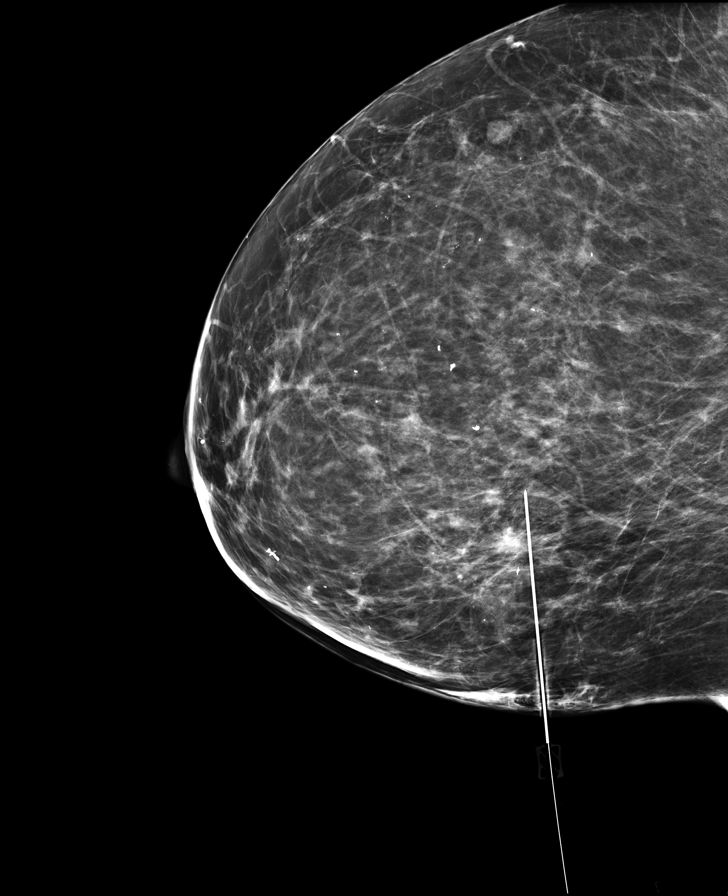

[R ML (2 of 3)]
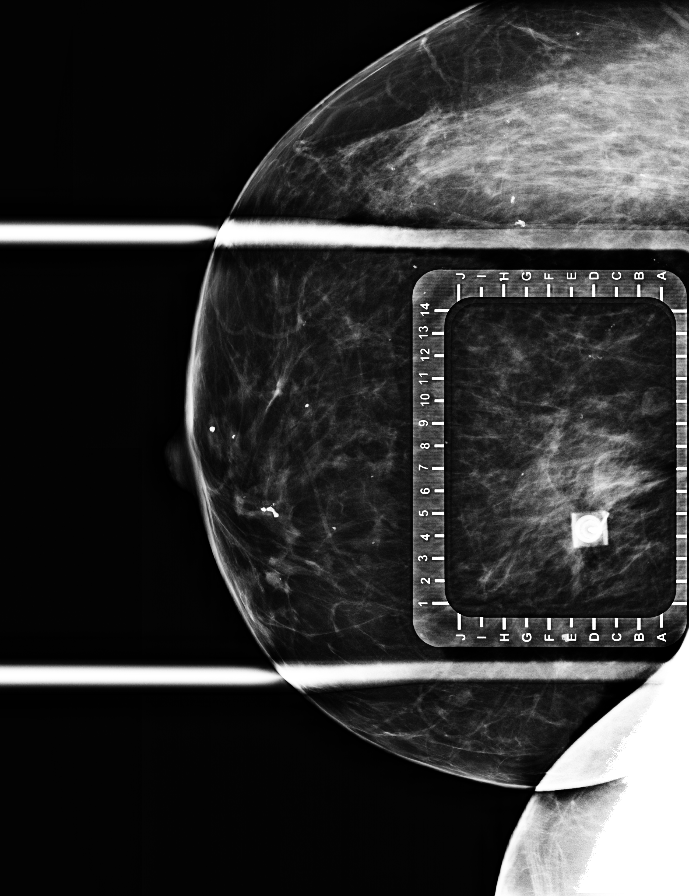

[R CC (4 of 4)]
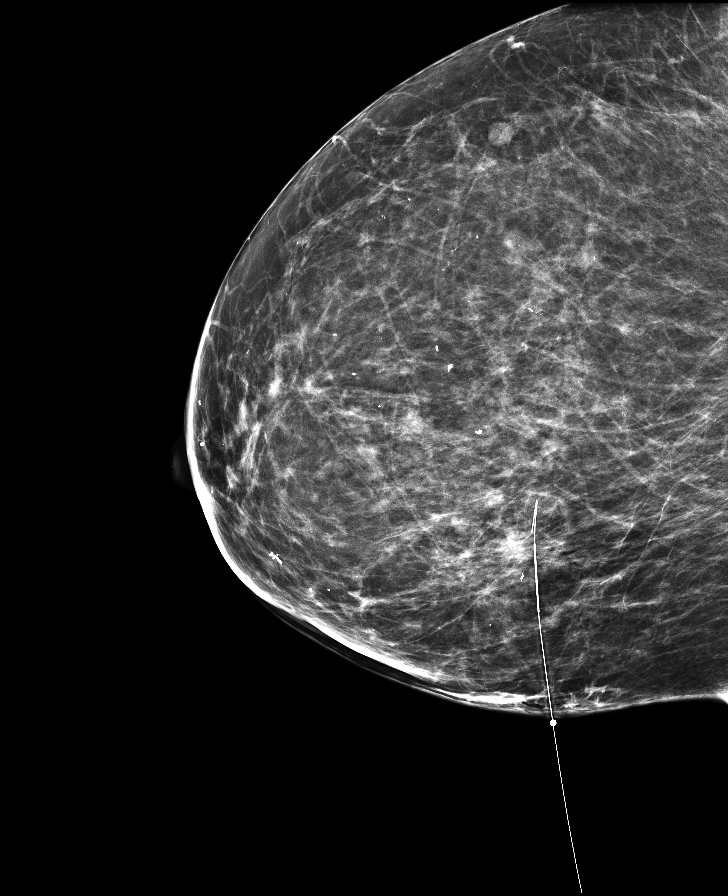

[R ML (3 of 3)]
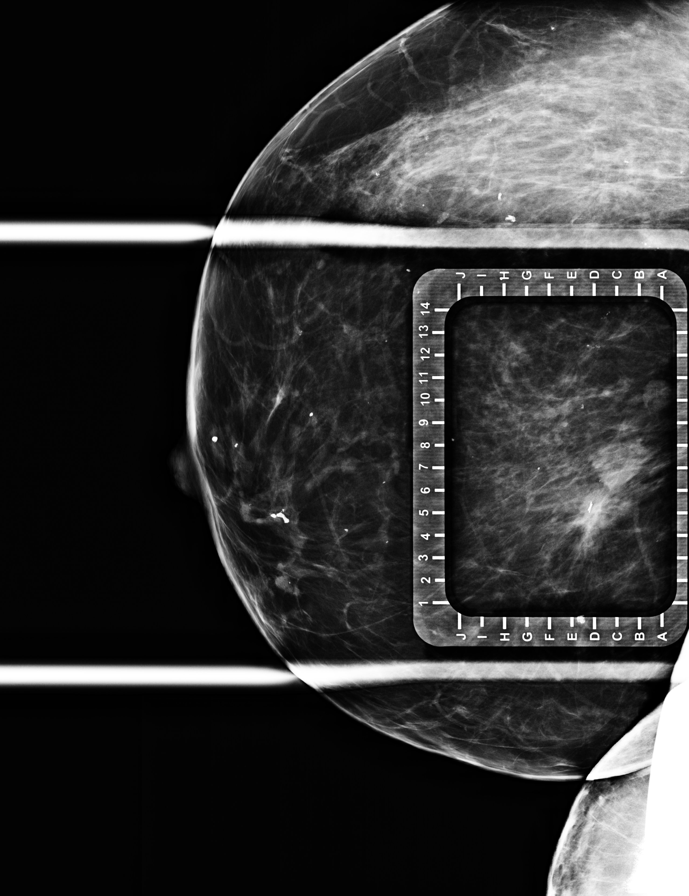

[7 of 7 positions shown; findings below may reference images not displayed]

PROCEDURE:
Patient presents for needle localization prior to right breast
lumpectomy. I met with the patient and we discussed the procedure of
needle localization including benefits and alternatives. We
discussed the high likelihood of a successful procedure. We
discussed the risks of the procedure, including infection, bleeding,
tissue injury, and further surgery. Informed, written consent was
given. The usual time-out protocol was performed immediately prior
to the procedure.

Using mammographic guidance, sterile technique, 1% lidocaine and a 7
cm modified Kopans needle, the mass and X shaped marking clip in the
lower inner right breast were localized using a medial approach. The
images were marked for Dr. Hailee.
IMPRESSION: Needle localization of the right breast. No apparent complications.

## 2021-09-19 ENCOUNTER — Encounter: Payer: Medicare Other | Attending: Oncology

## 2021-09-19 DIAGNOSIS — Z170 Estrogen receptor positive status [ER+]: Secondary | ICD-10-CM | POA: Insufficient documentation

## 2021-09-19 DIAGNOSIS — C50311 Malignant neoplasm of lower-inner quadrant of right female breast: Secondary | ICD-10-CM | POA: Insufficient documentation

## 2021-09-21 ENCOUNTER — Other Ambulatory Visit: Payer: Self-pay

## 2021-09-21 DIAGNOSIS — C50311 Malignant neoplasm of lower-inner quadrant of right female breast: Secondary | ICD-10-CM

## 2021-09-21 DIAGNOSIS — Z170 Estrogen receptor positive status [ER+]: Secondary | ICD-10-CM

## 2021-09-21 NOTE — Progress Notes (Signed)
Daily Session Note  Patient Details  Name: Amanda Hardin MRN: [id number] Date of Birth: 1956-12-13 Referring Provider:   [name] Cancer Associated Rehabilitation & Exercise from 08/03/2021 in [facility] Cardiac and Pulmonary Rehab  Referring Provider Delight Hoh MD       Encounter Date: 09/21/2021  Check In:  Session Check In - 09/21/21 1225       Check-In   Supervising physician immediately available to respond to emergencies See telemetry face sheet for immediately available ER MD    Location [facility]-Cardiac & Pulmonary Rehab    Staff Present Nada Maclachlan, BA, ACSM CEP, Exercise Physiologist;Kara Eliezer Bottom, MS, ASCM CEP, Exercise Physiologist    Virtual Visit No    Medication changes reported     No    Fall or balance concerns reported    No    Warm-up and Cool-down Performed on first and last piece of equipment    Resistance Training Performed Yes    VAD Patient? No    PAD/SET Patient? No      Pain Assessment   Currently in Pain? No/denies                Social History   Tobacco Use  Smoking Status Former   Years: 25.00   Types: Cigarettes   Quit date: 03/19/2002   Years since quitting: 19.5  Smokeless Tobacco Never    Goals Met:  Independence with exercise equipment Exercise tolerated well No report of concerns or symptoms today Strength training completed today  Goals Unmet:  Not Applicable  Comments: Pt able to follow exercise prescription today without complaint.  Will continue to monitor for progression.    Dr. Emily Filbert is Medical Director for [place].  Dr. Ottie Glazier is Medical Director for [facility] Pulmonary Rehabilitation.

## 2021-09-28 ENCOUNTER — Other Ambulatory Visit: Payer: Self-pay

## 2021-09-28 DIAGNOSIS — Z170 Estrogen receptor positive status [ER+]: Secondary | ICD-10-CM

## 2021-09-28 DIAGNOSIS — C50311 Malignant neoplasm of lower-inner quadrant of right female breast: Secondary | ICD-10-CM

## 2021-09-28 NOTE — Progress Notes (Signed)
Daily Session Note  Patient Details  Name: Amanda Hardin MRN: [id number] Date of Birth: [birth year] Referring Provider:   [name] Cancer Associated Rehabilitation & Exercise from 08/03/2021 in [facility] Cardiac and Pulmonary Rehab  Referring Provider Delight Hoh MD       Encounter Date: 09/28/2021  Check In:  Session Check In - 09/28/21 1232       Check-In   Supervising physician immediately available to respond to emergencies See telemetry face sheet for immediately available ER MD    Location [facility]-Cardiac & Pulmonary Rehab    Staff Present Birdie Sons, MPA, Nino Glow, MS, ASCM CEP, Exercise Physiologist;Amanda Oletta Darter, BA, ACSM CEP, Exercise Physiologist    Virtual Visit No    Medication changes reported     No    Fall or balance concerns reported    No    Warm-up and Cool-down Performed on first and last piece of equipment    Resistance Training Performed Yes    VAD Patient? No    PAD/SET Patient? No      Pain Assessment   Currently in Pain? No/denies                Social History   Tobacco Use  Smoking Status Former   Years: 25.00   Types: Cigarettes   Quit date: 03/19/2002   Years since quitting: 19.5  Smokeless Tobacco Never    Goals Met:  Independence with exercise equipment Exercise tolerated well No report of concerns or symptoms today Strength training completed today  Goals Unmet:  Not Applicable  Comments: Pt able to follow exercise prescription today without complaint.  Will continue to monitor for progression.    Dr. Emily Filbert is Medical Director for [place].  Dr. Ottie Glazier is Medical Director for [facility] Pulmonary Rehabilitation.

## 2021-10-06 ENCOUNTER — Ambulatory Visit
Admission: RE | Admit: 2021-10-06 | Discharge: 2021-10-06 | Disposition: A | Discharge status: Home / Self Care | Payer: Medicare Other | Source: Ambulatory Visit | Attending: General Surgery | Admitting: General Surgery

## 2021-10-06 ENCOUNTER — Other Ambulatory Visit: Payer: Self-pay

## 2021-10-06 DIAGNOSIS — C50311 Malignant neoplasm of lower-inner quadrant of right female breast: Secondary | ICD-10-CM

## 2021-10-10 ENCOUNTER — Other Ambulatory Visit: Payer: Self-pay

## 2021-10-10 DIAGNOSIS — C50311 Malignant neoplasm of lower-inner quadrant of right female breast: Secondary | ICD-10-CM

## 2021-10-10 DIAGNOSIS — Z170 Estrogen receptor positive status [ER+]: Secondary | ICD-10-CM

## 2021-10-10 NOTE — Progress Notes (Signed)
Daily Session Note  Patient Details  Name: Amanda Hardin MRN: [id number] Date of Birth: Nov 14, 1956 Referring Provider:   [name] Cancer Associated Rehabilitation & Exercise from 08/03/2021 in [facility] Cardiac and Pulmonary Rehab  Referring Provider Delight Hoh MD       Encounter Date: 10/10/2021  Check In:  Session Check In - 10/10/21 1213       Check-In   Supervising physician immediately available to respond to emergencies See telemetry face sheet for immediately available ER MD    Location [facility]-Cardiac & Pulmonary Rehab    Staff Present Birdie Sons, MPA, RN;Melissa [place], RDN, Tawanna Solo, MS, ASCM CEP, Exercise Physiologist    Virtual Visit No    Medication changes reported     No    Fall or balance concerns reported    No    Warm-up and Cool-down Performed on first and last piece of equipment    Resistance Training Performed Yes    VAD Patient? No    PAD/SET Patient? No      Pain Assessment   Currently in Pain? No/denies                Social History   Tobacco Use  Smoking Status Former   Years: 25.00   Types: Cigarettes   Quit date: 03/19/2002   Years since quitting: 19.5  Smokeless Tobacco Never    Goals Met:  Independence with exercise equipment Exercise tolerated well No report of concerns or symptoms today Strength training completed today  Goals Unmet:  Not Applicable  Comments: Pt able to follow exercise prescription today without complaint.  Will continue to monitor for progression.    Dr. Emily Filbert is Medical Director for [place].  Dr. Ottie Glazier is Medical Director for [facility] Pulmonary Rehabilitation.

## 2021-10-12 ENCOUNTER — Other Ambulatory Visit: Payer: Self-pay

## 2021-10-12 DIAGNOSIS — Z170 Estrogen receptor positive status [ER+]: Secondary | ICD-10-CM

## 2021-10-12 DIAGNOSIS — C50311 Malignant neoplasm of lower-inner quadrant of right female breast: Secondary | ICD-10-CM

## 2021-10-12 NOTE — Progress Notes (Signed)
Daily Session Note  Patient Details  Name: Amanda Hardin MRN: [id number] Date of Birth: 10/25/1956 Referring Provider:   Flowsheet Row Cancer Associated Rehabilitation & Exercise from 08/03/2021 in [facility] Cardiac and Pulmonary Rehab  Referring Provider Finnegan, Timothy MD       Encounter Date: 10/12/2021  Check In:  Session Check In - 10/12/21 1233       Check-In   Supervising physician immediately available to respond to emergencies See telemetry face sheet for immediately available ER MD    Location [facility]-Cardiac & Pulmonary Rehab    Staff Present  , MPA, RN;Melissa Caiola, RDN, LDN;Kara Langdon, MS, ASCM CEP, Exercise Physiologist    Virtual Visit No    Medication changes reported     No    Fall or balance concerns reported    No    Warm-up and Cool-down Performed on first and last piece of equipment    Resistance Training Performed Yes    VAD Patient? No    PAD/SET Patient? No      Pain Assessment   Currently in Pain? No/denies                Social History   Tobacco Use  Smoking Status Former   Years: 25.00   Types: Cigarettes   Quit date: 03/19/2002   Years since quitting: 19.5  Smokeless Tobacco Never    Goals Met:  Independence with exercise equipment Exercise tolerated well No report of concerns or symptoms today Strength training completed today  Goals Unmet:  Not Applicable  Comments: Pt able to follow exercise prescription today without complaint.  Will continue to monitor for progression.  Patient noted she has been having significant memory loss, and I encouraged her to reach out to her doctor to let them know. EP will also send message to make doctor aware. Patient understood.    Dr. Mark Miller is Medical Director for [facility] Cardiac [facility].  Dr. Fuad Aleskerov is Medical Director for [facility] Pulmonary Rehabilitation. 

## 2021-10-17 ENCOUNTER — Encounter: Payer: Medicare Other | Attending: Oncology

## 2021-10-17 DIAGNOSIS — Z170 Estrogen receptor positive status [ER+]: Secondary | ICD-10-CM | POA: Insufficient documentation

## 2021-10-17 DIAGNOSIS — C50311 Malignant neoplasm of lower-inner quadrant of right female breast: Secondary | ICD-10-CM | POA: Insufficient documentation

## 2021-10-19 ENCOUNTER — Other Ambulatory Visit: Payer: Self-pay

## 2021-10-19 DIAGNOSIS — C50311 Malignant neoplasm of lower-inner quadrant of right female breast: Secondary | ICD-10-CM

## 2021-10-19 DIAGNOSIS — Z170 Estrogen receptor positive status [ER+]: Secondary | ICD-10-CM

## 2021-10-19 NOTE — Progress Notes (Signed)
Incomplete Session Note  Patient Details  Name: Amanda Hardin MRN: [id number] Date of Birth: 1956-07-01 Referring Provider:   [name] Cancer Associated Rehabilitation & Exercise from 08/03/2021 in [facility] Cardiac and Pulmonary Rehab  Referring Provider Delight Hoh MD       [name] arrived to CARE. She sat down and said she did not feel good and had a headache. Patient did not exercise. All vitals were stable. EP spoke with patient and encouraged to call her doctor if she continued to not feel well.

## 2021-10-24 ENCOUNTER — Other Ambulatory Visit: Payer: Self-pay

## 2021-10-24 DIAGNOSIS — Z170 Estrogen receptor positive status [ER+]: Secondary | ICD-10-CM

## 2021-10-24 DIAGNOSIS — C50311 Malignant neoplasm of lower-inner quadrant of right female breast: Secondary | ICD-10-CM

## 2021-10-24 NOTE — Progress Notes (Signed)
Daily Session Note  Patient Details  Name: Amanda Hardin MRN: [id number] Date of Birth: Mar 01, 1956 Referring Provider:   [name] Cancer Associated Rehabilitation & Exercise from 08/03/2021 in [facility] Re[facility]  Referring Provider Delight Hoh MD       Encounter Date: 10/24/2021  Check In:  Session Check In - 10/24/21 1238       Check-In   Supervising physician immediately available to respond to emergencies See telemetry face sheet for immediately available ER MD    Location [facility] & Pulmonary Rehab    Staff Present Birdie Sons, MPA, Nino Glow, MS, ASCM CEP, Exercise Physiologist;Amanda Oletta Darter, BA, ACSM CEP, Exercise Physiologist    Virtual Visit No    Medication changes reported     No    Fall or balance concerns reported    No    Warm-up and Cool-down Performed on first and last piece of equipment    Resistance Training Performed Yes    VAD Patient? No    PAD/SET Patient? No      Pain Assessment   Currently in Pain? No/denies                Social History   Tobacco Use  Smoking Status Former   Years: 25.00   Types: Cigarettes   Quit date: 03/19/2002   Years since quitting: 19.6  Smokeless Tobacco Never    Goals Met:  Independence with exercise equipment Exercise tolerated well No report of concerns or symptoms today Strength training completed today  Goals Unmet:  Not Applicable  Comments: Pt able to follow exercise prescription today without complaint.  Will continue to monitor for progression.    Dr. Emily Filbert is Medical Director for [place].  Dr. Ottie Glazier is Medical Director for [facility] Pulmonary Rehabilitation.

## 2021-10-26 ENCOUNTER — Other Ambulatory Visit: Payer: Self-pay

## 2021-10-26 DIAGNOSIS — Z170 Estrogen receptor positive status [ER+]: Secondary | ICD-10-CM

## 2021-10-26 NOTE — Progress Notes (Signed)
Daily Session Note  Patient Details  Name: Amanda Hardin MRN: [id number] Date of Birth: 11/10/1956 Referring Provider:   Flowsheet Row Cancer Associated Rehabilitation & Exercise from 08/03/2021 in [facility] and Pulmonary Rehab  Referring Provider Finnegan, Timothy MD       Encounter Date: 10/26/2021  Check In:  Session Check In - 10/26/21 1234       Check-In   Supervising physician immediately available to respond to emergencies See telemetry face sheet for immediately available ER MD    Location [facility]    Staff Present  , MPA, RN;Kara Langdon, MS, ASCM CEP, Exercise Physiologist;Amanda Sommer, BA, ACSM CEP, Exercise Physiologist    Virtual Visit No    Medication changes reported     No    Fall or balance concerns reported    No    Warm-up and Cool-down Performed on first and last piece of equipment    Resistance Training Performed Yes    VAD Patient? No    PAD/SET Patient? No      Pain Assessment   Currently in Pain? No/denies                Social History   Tobacco Use  Smoking Status Former   Years: 25.00   Types: Cigarettes   Quit date: 03/19/2002   Years since quitting: 19.6  Smokeless Tobacco Never    Goals Met:  Independence with exercise equipment Exercise tolerated well No report of concerns or symptoms today Strength training completed today  Goals Unmet:  Not Applicable  Comments: Pt able to follow exercise prescription today without complaint.  Will continue to monitor for progression.    Dr. Mark Miller is Medical Director for [facility].  Dr. Fuad Aleskerov is Medical Director for [facility]monary [facility]. 

## 2021-10-31 ENCOUNTER — Other Ambulatory Visit: Payer: Self-pay

## 2021-10-31 DIAGNOSIS — Z170 Estrogen receptor positive status [ER+]: Secondary | ICD-10-CM

## 2021-10-31 DIAGNOSIS — C50311 Malignant neoplasm of lower-inner quadrant of right female breast: Secondary | ICD-10-CM

## 2021-10-31 NOTE — Progress Notes (Signed)
Daily Session Note  Patient Details  Name: Amanda Hardin MRN: [id number] Date of Birth: 05/25/1956 Referring Provider:   Flowsheet Row Cancer Associated Rehabilitation & Exercise from 08/03/2021 in [facility]  Referring Provider Finnegan, Timothy MD       Encounter Date: 10/31/2021  Check In:  Session Check In - 10/31/21 1229       Check-In   Supervising physician immediately available to respond to emergencies See telemetry face sheet for immediately available ER MD    Location [facility]    Staff Present  , MPA, RN;Jessica Hawkins, MA, RCEP, CCRP, CCET;Kara Langdon, MS, ASCM CEP, Exercise Physiologist    Virtual Visit No    Medication changes reported     No    Fall or balance concerns reported    No    Warm-up and Cool-down Performed on first and last piece of equipment    Resistance Training Performed Yes    VAD Patient? No    PAD/SET Patient? No      Pain Assessment   Currently in Pain? No/denies                Social History   Tobacco Use  Smoking Status Former   Years: 25.00   Types: Cigarettes   Quit date: 03/19/2002   Years since quitting: 19.6  Smokeless Tobacco Never    Goals Met:  Independence with exercise equipment Exercise tolerated well No report of concerns or symptoms today Strength training completed today  Goals Unmet:  Not Applicable  Comments: Pt able to follow exercise prescription today without complaint.  Will continue to monitor for progression.    Dr. Mark Miller is Medical Director for [facility].  Dr. Fuad Aleskerov is Medical Director for [facility]. 

## 2021-11-07 ENCOUNTER — Other Ambulatory Visit: Payer: Self-pay

## 2021-11-07 DIAGNOSIS — C50311 Malignant neoplasm of lower-inner quadrant of right female breast: Secondary | ICD-10-CM

## 2021-11-07 NOTE — Progress Notes (Signed)
Daily Session Note  Patient Details  Name: Amanda Hardin MRN: [id number] Date of Birth: 1956-05-11 Referring Provider:   [name] Cancer Associated Rehabilitation & Exercise from 08/03/2021 in [facility] Cardiac and Pulmonary Rehab  Referring Provider Delight Hoh MD       Encounter Date: 11/07/2021  Check In:  Session Check In - 11/07/21 1519       Check-In   Supervising physician immediately available to respond to emergencies See telemetry face sheet for immediately available ER MD    Location [facility]    Staff Present Coralie Keens, MS, ASCM CEP, Exercise Physiologist;Kelly Rosalia Hammers, MPA, RN;Amanda Sommer, BA, ACSM CEP, Exercise Physiologist    Virtual Visit No    Medication changes reported     No    Fall or balance concerns reported    No    Warm-up and Cool-down Performed on first and last piece of equipment    Resistance Training Performed Yes    VAD Patient? No    PAD/SET Patient? No                Social History   Tobacco Use  Smoking Status Former   Years: 25.00   Types: Cigarettes   Quit date: 03/19/2002   Years since quitting: 19.6  Smokeless Tobacco Never    Goals Met:  Independence with exercise equipment Exercise tolerated well No report of concerns or symptoms today Strength training completed today  Goals Unmet:  Not Applicable  Comments: Pt able to follow exercise prescription today without complaint.  Will continue to monitor for progression.    Dr. Emily Filbert is Medical Director for [place].  Dr. Ottie Glazier is Medical Director for [facility] Pulmonary Rehabilitation.

## 2021-11-14 ENCOUNTER — Telehealth: Payer: Self-pay

## 2021-11-14 NOTE — Telephone Encounter (Signed)
Checked in with patient regarding CARE program, missed several appointments- confirmed graduation date will be next Tuesday, 12/6.

## 2021-11-16 ENCOUNTER — Other Ambulatory Visit: Payer: Self-pay

## 2021-11-16 ENCOUNTER — Encounter: Payer: Medicare Other | Attending: Oncology

## 2021-11-16 VITALS — Ht 61.0 in | Wt 161.3 lb

## 2021-11-16 DIAGNOSIS — Z170 Estrogen receptor positive status [ER+]: Secondary | ICD-10-CM | POA: Insufficient documentation

## 2021-11-16 DIAGNOSIS — C50311 Malignant neoplasm of lower-inner quadrant of right female breast: Secondary | ICD-10-CM | POA: Insufficient documentation

## 2021-11-16 NOTE — Patient Instructions (Addendum)
Discharge Patient Instructions  Patient Details  Name: Amanda Hardin MRN: [id number] Date of Birth: Nov 03, 1956 Referring Provider:  Lloyd Huger, MD   Number of Visits: 20  Reason for Discharge:  Patient reached a stable level of exercise. 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, estrogen receptor positive ([place])  Initial Exercise Prescription:  Initial Exercise Prescription - 08/03/21 1400       Date of Initial Exercise RX and Referring Provider   Date 08/03/21    Referring Provider Delight Hoh MD      NuStep   Level 1    SPM 80    Minutes 15    METs 2.8      REL-XR   Level 1    Speed 50    Minutes 15    METs 2.8      Track   Laps 30    Minutes 15    METs 2.63      Prescription Details   Frequency (times per week) 2    Duration Progress to 30 minutes of continuous aerobic without signs/symptoms of physical distress      Intensity   THRR 40-80% of Max Heartrate 114-140    Ratings of Perceived Exertion 11-13    Perceived Dyspnea 0-4      Progression   Progression Continue to progress workloads to maintain intensity without signs/symptoms of physical distress.      Resistance Training   Training Prescription Yes    Weight 3 lb    Reps 10-15             Discharge Exercise Prescription (Final Exercise Prescription Changes):  Exercise Prescription Changes - 11/16/21 1300       Response to Exercise   Blood Pressure (Admit) 134/66    Blood Pressure (Exit) 106/64    Heart Rate (Admit) 84 bpm    Heart Rate (Exercise) 109 bpm    Heart Rate (Exit) 77 bpm    Oxygen Saturation (Admit) 96 %    Oxygen Saturation (Exercise) 94 %    Oxygen Saturation (Exit) 94 %    Rating of Perceived  Exertion (Exercise) 12    Perceived Dyspnea (Exercise) 0    Symptoms right knee pain 5/10    Duration Continue with 30 min of aerobic exercise without signs/symptoms of physical distress.    Intensity THRR unchanged      Progression   Progression Continue to progress workloads to maintain intensity without signs/symptoms of physical distress.    Average METs 2.61      Resistance Training   Training Prescription Yes    Weight 4 lb    Reps 10-15      Interval Training   Interval Training No      NuStep   Level 2    Minutes 15    METs 4.4      REL-XR   Level 2    Minutes 15    METs 2.6      T5 Nustep   Level 2    Minutes 15    METs 2      Biostep-RELP   Level 2    Minutes 15    METs 2  Functional Capacity:  6 Minute Walk     Row Name 08/03/21 1420 11/16/21 1354       6 Minute Walk   Phase Initial Discharge    Distance 1175 feet 1220 feet    Distance % Change -- 3.82 %    Distance Feet Change -- 45 ft    Walk Time 6 minutes 6 minutes    # of Rest Breaks 0 0    MPH 2.22 2.31    METS 2.8 3.06    RPE 11 11    Perceived Dyspnea  1 1    VO2 Peak 9.83 10.72    Symptoms No Yes (comment)    Comments -- 5/10 right knee pain    Resting HR 88 bpm 77 bpm    Resting BP 132/64 128/66    Resting Oxygen Saturation  94 % 95 %    Exercise Oxygen Saturation  during 6 min walk 91 % 95 %    Max Ex. HR 126 bpm 122 bpm    Max Ex. BP 152/64 152/60    2 Minute Post BP 124/62 --              Nutrition & Weight - Outcomes:  Pre Biometrics - 08/01/21 1400       Pre Biometrics   Height 5\' 1"  (1.549 m)    Weight 165 lb 3.2 oz (74.9 kg)    BMI (Calculated) 31.23             Post Biometrics - 11/16/21 1355        Post  Biometrics   Height 5\' 1"  (1.549 m)    Weight 161 lb 4.8 oz (73.2 kg)    BMI (Calculated) 30.49              Goals reviewed with patient; copy given to patient.

## 2021-11-16 NOTE — Progress Notes (Signed)
Daily Session Note  Patient Details  Name: Amanda Hardin MRN: [id number] Date of Birth: 1956-01-30 Referring Provider:   [name] Cancer Associated Rehabilitation & Exercise from 08/03/2021 in [facility] Cardiac and Pulmonary Rehab  Referring Provider Delight Hoh MD       Encounter Date: 11/16/2021  Check In:  Session Check In - 11/16/21 1226       Check-In   Supervising physician immediately available to respond to emergencies See telemetry face sheet for immediately available ER MD    Location [facility]    Staff Present Birdie Sons, MPA, Nino Glow, MS, ASCM CEP, Exercise Physiologist;Amanda Oletta Darter, BA, ACSM CEP, Exercise Physiologist    Virtual Visit No    Medication changes reported     No    Fall or balance concerns reported    No    Warm-up and Cool-down Performed on first and last piece of equipment    Resistance Training Performed Yes    VAD Patient? No    PAD/SET Patient? No      Pain Assessment   Currently in Pain? No/denies              6 Minute Walk     Row Name 08/03/21 1420 11/16/21 1354       6 Minute Walk   Phase Initial Discharge    Distance 1175 feet 1220 feet    Distance % Change -- 3.82 %    Distance Feet Change -- 45 ft    Walk Time 6 minutes 6 minutes    # of Rest Breaks 0 0    MPH 2.22 2.31    METS 2.8 3.06    RPE 11 11    Perceived Dyspnea  1 1    VO2 Peak 9.83 10.72    Symptoms No Yes (comment)    Comments -- 5/10 right knee pain    Resting HR 88 bpm 77 bpm    Resting BP 132/64 128/66    Resting Oxygen Saturation  94 % 95 %    Exercise Oxygen Saturation  during 6 min walk 91 % 95 %    Max Ex. HR 126 bpm 122 bpm    Max Ex. BP 152/64 152/60    2 Minute Post BP 124/62 --                Social History   Tobacco Use  Smoking Status Former   Years: 25.00   Types: Cigarettes   Quit date: 03/19/2002   Years since quitting: 19.6  Smokeless Tobacco Never    Goals Met:  Independence with  exercise equipment Exercise tolerated well No report of concerns or symptoms today Strength training completed today 6MWT completed  Goals Unmet:  Not Applicable  Comments: Pt able to follow exercise prescription today without complaint.  Will continue to monitor for progression.    Dr. Emily Filbert is Medical Director for [place].  Dr. Ottie Glazier is Medical Director for [facility] Pulmonary Rehabilitation.

## 2021-11-21 ENCOUNTER — Encounter: Payer: Medicare Other | Admitting: *Deleted

## 2021-11-21 ENCOUNTER — Other Ambulatory Visit: Payer: Self-pay

## 2021-11-21 DIAGNOSIS — Z170 Estrogen receptor positive status [ER+]: Secondary | ICD-10-CM

## 2021-11-21 NOTE — Progress Notes (Signed)
Daily Session Note  Patient Details  Name: Amanda Hardin MRN: [id number] Date of Birth: [birth year] Referring Provider:   [name] Cancer Associated Rehabilitation & Exercise from 08/03/2021 in [facility] Cardiac and Pulmonary Rehab  Referring Provider Delight Hoh MD       Encounter Date: 11/21/2021  Check In:  Session Check In - 11/21/21 1305       Check-In   Supervising physician immediately available to respond to emergencies See telemetry face sheet for immediately available ER MD    Location [facility]    Staff Present Alberteen Sam, MA, RCEP, CCRP, Marylynn Pearson, MS, ASCM CEP, Exercise Physiologist    Virtual Visit No    Medication changes reported     No    Fall or balance concerns reported    No    Warm-up and Cool-down Performed on first and last piece of equipment    Resistance Training Performed Yes    VAD Patient? No    PAD/SET Patient? No      Pain Assessment   Currently in Pain? No/denies                Social History   Tobacco Use  Smoking Status Former   Years: 25.00   Types: Cigarettes   Quit date: 03/19/2002   Years since quitting: 19.6  Smokeless Tobacco Never    Goals Met:  Proper associated with RPD/PD & O2 Sat Independence with exercise equipment Exercise tolerated well No report of concerns or symptoms today Strength training completed today  Goals Unmet:  Not Applicable  Comments:  Amanda Hardin graduated today from  rehab with 36 sessions completed.  Details of the patient's exercise prescription and what She needs to do in order to continue the prescription and progress were discussed with patient.  Patient was given a copy of prescription and goals.  Patient verbalized understanding.  Amanda Hardin plans to continue to exercise by walking.    Dr. Emily Filbert is Medical Director for [place].  Dr. Ottie Glazier is Medical Director for [facility] Pulmonary Rehabilitation.

## 2021-11-21 NOTE — Progress Notes (Signed)
Discharge Progress Report  Patient Details  Name: Amanda Hardin MRN: [id number] Date of Birth: 20-Mar-1956 Referring Provider:   [name] Cancer Associated Rehabilitation & Exercise from 08/03/2021 in [facility] Cardiac and Pulmonary Rehab  Referring Provider Delight Hoh MD        Number of Visits: 20  Reason for Discharge:  Patient reached a stable level of exercise. 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, estrogen receptor positive ([place])  ADL UCSD:   Initial Exercise Prescription:  Initial Exercise Prescription - 08/03/21 1400       Date of Initial Exercise RX and Referring Provider   Date 08/03/21    Referring Provider Delight Hoh MD      NuStep   Level 1    SPM 80    Minutes 15    METs 2.8      REL-XR   Level 1    Speed 50    Minutes 15    METs 2.8      Track   Laps 30    Minutes 15    METs 2.63      Prescription Details   Frequency (times per week) 2    Duration Progress to 30 minutes of continuous aerobic without signs/symptoms of physical distress      Intensity   THRR 40-80% of Max Heartrate 114-140    Ratings of Perceived Exertion 11-13    Perceived Dyspnea 0-4      Progression   Progression Continue to progress workloads to maintain intensity without signs/symptoms of physical distress.      Resistance Training   Training Prescription Yes    Weight 3 lb    Reps 10-15             Discharge Exercise Prescription (Final Exercise Prescription Changes):  Exercise Prescription Changes - 11/16/21 1300       Response to Exercise   Blood Pressure (Admit) 134/66    Blood Pressure (Exit) 106/64    Heart Rate (Admit) 84 bpm    Heart Rate (Exercise) 109 bpm    Heart Rate  (Exit) 77 bpm    Oxygen Saturation (Admit) 96 %    Oxygen Saturation (Exercise) 94 %    Oxygen Saturation (Exit) 94 %    Rating of Perceived Exertion (Exercise) 12    Perceived Dyspnea (Exercise) 0    Symptoms right knee pain 5/10    Duration Continue with 30 min of aerobic exercise without signs/symptoms of physical distress.    Intensity THRR unchanged      Progression   Progression Continue to progress workloads to maintain intensity without signs/symptoms of physical distress.    Average METs 2.61      Resistance Training   Training Prescription Yes    Weight 4 lb    Reps 10-15      Interval Training   Interval Training No      NuStep   Level 2    Minutes 15    METs 4.4      REL-XR   Level 2    Minutes 15    METs 2.6      T5 Nustep   Level 2    Minutes 15  METs 2      Biostep-RELP   Level 2    Minutes 15    METs 2             Functional Capacity:  6 Minute Walk     Row Name 08/03/21 1420 11/16/21 1354       6 Minute Walk   Phase Initial Discharge    Distance 1175 feet 1220 feet    Distance % Change -- 3.82 %    Distance Feet Change -- 45 ft    Walk Time 6 minutes 6 minutes    # of Rest Breaks 0 0    MPH 2.22 2.31    METS 2.8 3.06    RPE 11 11    Perceived Dyspnea  1 1    VO2 Peak 9.83 10.72    Symptoms No Yes (comment)    Comments -- 5/10 right knee pain    Resting HR 88 bpm 77 bpm    Resting BP 132/64 128/66    Resting Oxygen Saturation  94 % 95 %    Exercise Oxygen Saturation  during 6 min walk 91 % 95 %    Max Ex. HR 126 bpm 122 bpm    Max Ex. BP 152/64 152/60    2 Minute Post BP 124/62 --              Nutrition & Weight - Outcomes:  Pre Biometrics - 08/01/21 1400       Pre Biometrics   Height 5\' 1"  (1.549 m)    Weight 165 lb 3.2 oz (74.9 kg)    BMI (Calculated) 31.23             Post Biometrics - 11/16/21 1355        Post  Biometrics   Height 5\' 1"  (1.549 m)    Weight 161 lb 4.8 oz (73.2 kg)    BMI  (Calculated) 30.49               Goals reviewed with patient; copy given to patient.

## 2021-11-30 ENCOUNTER — Ambulatory Visit: Payer: Medicare Other | Admitting: Podiatry

## 2021-12-07 ENCOUNTER — Inpatient Hospital Stay: Payer: Medicare Other | Attending: Oncology | Admitting: Oncology

## 2021-12-21 ENCOUNTER — Ambulatory Visit: Payer: Medicare Other | Admitting: Podiatry

## 2022-01-02 ENCOUNTER — Ambulatory Visit: Payer: Medicare Other | Admitting: Podiatry

## 2022-01-04 ENCOUNTER — Inpatient Hospital Stay: Payer: Medicare Other | Admitting: Oncology

## 2022-01-15 ENCOUNTER — Inpatient Hospital Stay: Payer: Medicare Other | Attending: Nurse Practitioner | Admitting: Oncology

## 2022-01-15 ENCOUNTER — Other Ambulatory Visit: Payer: Self-pay

## 2022-01-15 ENCOUNTER — Encounter: Payer: Self-pay | Admitting: Oncology

## 2022-01-15 VITALS — BP 142/66 | HR 106 | Temp 98.7°F | Resp 20 | Wt 156.6 lb

## 2022-01-15 DIAGNOSIS — C50311 Malignant neoplasm of lower-inner quadrant of right female breast: Secondary | ICD-10-CM | POA: Insufficient documentation

## 2022-01-15 DIAGNOSIS — Z79811 Long term (current) use of aromatase inhibitors: Secondary | ICD-10-CM | POA: Diagnosis not present

## 2022-01-15 DIAGNOSIS — Z87891 Personal history of nicotine dependence: Secondary | ICD-10-CM | POA: Insufficient documentation

## 2022-01-15 DIAGNOSIS — Z170 Estrogen receptor positive status [ER+]: Secondary | ICD-10-CM | POA: Insufficient documentation

## 2022-01-15 DIAGNOSIS — Z79899 Other long term (current) drug therapy: Secondary | ICD-10-CM | POA: Insufficient documentation

## 2022-01-15 NOTE — Progress Notes (Signed)
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  Oncotype Dx score of 13 which is low risk.  INTERVAL HISTORY: Amanda Hardin is a 66 year old female with past medical history significant for right breast cancer who is followed Dr. Grayland Ormond and is on letrozole.  Amanda Hardin is status post right breast lumpectomy and 2021.  Reports hot flashes mainly at bedtime that wake her up a few days per week. No joint pain. Last mammogram was from 10/06/2021 which was negative for new or recurrent breast carcinoma.  Amanda Hardin is also followed by Dr. Bary Castilla.  No recent hospitalizations.  REVIEW OF SYSTEMS:   Review of Systems  Constitutional: Negative.  Negative for fever, malaise/fatigue and weight loss.  Respiratory: Negative.  Negative for cough, hemoptysis and shortness of breath.   Cardiovascular: Negative.  Negative for chest pain and leg swelling.  Gastrointestinal: Negative.  Negative for abdominal pain.  Genitourinary: Negative.  Negative for dysuria.  Musculoskeletal:  Positive for joint pain. Negative for back pain.  Skin: Negative.  Negative for rash.  Neurological: Negative.  Negative for dizziness, focal weakness, weakness and headaches.  Endo/Heme/Allergies:        Hot flashes  Psychiatric/Behavioral: Negative.  The patient is not nervous/anxious.    As per HPI. Otherwise, a complete review of  systems is negative.  PAST MEDICAL HISTORY: Past Medical History:  Diagnosis Date   Anxiety    Arthritis    Complication of anesthesia    Difficulty waking up.   Cough    Depression    GERD (gastroesophageal reflux disease)    Hyperlipidemia    Hypertension    Thyroid nodule    Torn meniscus    R     PAST SURGICAL HISTORY: Past Surgical History:  Procedure Laterality Date   BREAST BIOPSY Left 09/2015   [place] core bx  NEG    BREAST BIOPSY Right    both core and excisional done, multiples for fibroadenomas   BREAST BIOPSY Right 10/26/2020   stereo biopsy, clip, [facility]   BREAST EXCISIONAL BIOPSY Left 2014   fibroadenomas   BREAST LUMPECTOMY Right 12/07/2020   [facility]   BREAST LUMPECTOMY WITH NEEDLE LOCALIZATION AND AXILLARY SENTINEL LYMPH NODE BX Right 12/07/2020   Procedure: BREAST LUMPECTOMY WITH NEEDLE LOCALIZATION AND AXILLARY SENTINEL LYMPH NODE BX;  Surgeon: Robert Bellow, MD;  Location: [facility] ORS;  Service: General;  Laterality: Right;   CESAREAN SECTION     CYSTOSCOPY N/A 07/09/2018   Procedure: CYSTOSCOPY;  Surgeon: Will Bonnet, MD;  Location: [facility] ORS;  Service: Gynecology;  Laterality: N/A;   ROBOTIC ASSISTED LAPAROSCOPIC OVARIAN CYSTECTOMY Bilateral 07/09/2018   Procedure: ROBOTIC ASSISTED LAPAROSCOPIC OVARIAN CYSTECTOMY;  Surgeon: Will Bonnet, MD;  Location: [facility] ORS;  Service: Gynecology;  Laterality: Bilateral;   ROBOTIC ASSISTED TOTAL HYSTERECTOMY WITH BILATERAL SALPINGO OOPHERECTOMY Bilateral 07/09/2018   Procedure: ROBOTIC ASSISTED TOTAL HYSTERECTOMY WITH BILATERAL SALPINGO OOPHORECTOMY;  Surgeon: Will Bonnet, MD;  Location: [facility] ORS;  Service: Gynecology;  Laterality: Bilateral;   TONSILLECTOMY      FAMILY HISTORY: Family History  Problem Relation Age of Onset   Diabetes Maternal Aunt    Hypertension Maternal Aunt    Prostate cancer Father    Heart failure Father    Pancreatic cancer Paternal Aunt    Breast cancer Neg Hx    Bladder  Cancer Neg Hx    Kidney cancer Neg Hx     ADVANCED DIRECTIVES (Y/N):  N  HEALTH MAINTENANCE: Social History   Tobacco Use   Smoking status: Former    Years: 25.00    Types: Cigarettes    Quit date: 03/19/2002    Years since quitting: 19.8   Smokeless tobacco: Never  Vaping Use   Vaping Use: Never used  Substance Use Topics   Alcohol use: No   Drug use: No     Colonoscopy:  PAP:  Bone density:  Lipid panel:  Allergies  Allergen Reactions   Norco [Hydrocodone-Acetaminophen] Nausea And Vomiting   Penicillin G     Childhood allergy Has patient had a PCN reaction causing immediate rash, facial/tongue/throat swelling, SOB or lightheadedness with hypotension: Unknown Has patient had a PCN reaction causing severe rash involving mucus membranes or skin necrosis: Unknown Has patient had a PCN reaction that required hospitalization: Unknown Has patient had a PCN reaction occurring within the last 10 years: No If all of the above answers are "NO", then may proceed with Cephalosporin use.    Tape Rash    Current Outpatient Medications  Medication Sig Dispense Refill   DULoxetine (CYMBALTA) 30 MG capsule Take 30 mg by mouth daily. Take with 24m tab for a total of 90 mg     DULoxetine (CYMBALTA) 60 MG capsule Take 60 mg by mouth daily. Take with 379mtab for a total of 90 mg     enalapril (VASOTEC) 10 MG tablet Take 10 mg by mouth daily.      hydrochlorothiazide (HYDRODIURIL) 12.5 MG tablet Take 12.5 mg by mouth daily.      letrozole (FEMARA) 2.5 MG tablet TAKE 1 TABLET(2.5 MG) BY MOUTH DAILY 30 tablet 3   meclizine (ANTIVERT) 25 MG tablet Take 25 mg by mouth daily as needed for dizziness.      pantoprazole (PROTONIX) 40 MG tablet Take 40 mg by mouth daily.     pravastatin (PRAVACHOL) 40 MG tablet Take 40 mg by mouth at bedtime.      pregabalin (LYRICA) 150 MG capsule Take 450 mg by mouth 2 (two) times daily.     valACYclovir (VALTREX) 500 MG tablet Take 500 mg by mouth daily.      No current facility-administered medications for this visit.    OBJECTIVE: There were no vitals filed for this visit.    There is no height or weight on file to calculate BMI.    ECOG FS:0 - Asymptomatic  Physical Exam Constitutional:      Appearance: Normal appearance.  HENT:     Head: Normocephalic and atraumatic.  Eyes:     Pupils: Pupils are equal, round, and reactive to light.  Cardiovascular:     Rate and Rhythm: Normal rate and regular rhythm.     Heart sounds: Normal heart sounds. No murmur heard. Pulmonary:     Effort: Pulmonary effort is normal.     Breath sounds: Normal breath sounds. No wheezing.  Abdominal:     General: Bowel sounds are normal. There is no distension.     Palpations: Abdomen is  soft.     Tenderness: There is no abdominal tenderness.  Musculoskeletal:        General: Normal range of motion.     Cervical back: Normal range of motion.  Skin:    General: Skin is warm and dry.     Findings: No rash.  Neurological:     Mental Status: Amanda Hardin is alert and oriented to person, place, and time.  Psychiatric:        Judgment: Judgment normal.     LAB RESULTS:  Lab Results  Component Value Date   NA 141 11/29/2020   K 3.2 (L) 11/29/2020   CL 102 11/29/2020   CO2 30 11/29/2020   GLUCOSE 106 (H) 11/29/2020   BUN 15 11/29/2020   CREATININE 0.95 11/29/2020   CALCIUM 9.0 11/29/2020   PROT 6.9 07/09/2018   ALBUMIN 3.3 (L) 07/09/2018   AST 32 07/09/2018   ALT 12 07/09/2018   ALKPHOS 51 07/09/2018   BILITOT 0.4 07/09/2018   GFRNONAA >60 11/29/2020   GFRAA 58 (L) 07/10/2018    Lab Results  Component Value Date   WBC 4.9 03/02/2021   HGB 12.4 03/02/2021   HCT 38.8 03/02/2021   MCV 86.2 03/02/2021   PLT 242 03/02/2021     STUDIES: No results found.  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  Oncotype DX 13.  PLAN:   Clinically Amanda Hardin is doing well.  Her  last mammogram from 10/06/2021 was negative for recurrence of disease.  Amanda Hardin will be due for a repeat in October 2023.  Amanda Hardin is currently on letrozole and tolerating fair  Amanda Hardin will complete 5 years of treatment in March 2027. Amanda Hardin is having some hot flashes that appear worse at nighttime. Discussed changing the time of day at which Amanda Hardin takes letrozole. Discussed alternate AI's Amanda Hardin could try should letrozole side effects worsen. Will discuss with Dr. Grayland Ormond at next visit.   Bone health-bone density scan from 03/20/2021 was reported as -0.5 which is normal.  Repeat in April 2024.  Disposition- RTC in 6 months for follow-up with Dr. Grayland Ormond Dr. Bary Castilla will arrange for repeat mammogram.  I spent 25 minutes dedicated to the care of this patient (face-to-face and non-face-to-face) on the date of the encounter to include what is described in the assessment and plan.   Patient expressed understanding and was in agreement with this plan. Amanda Hardin also understands that Amanda Hardin can call clinic at any time with any questions, concerns, or complaints.    Cancer Staging  Carcinoma of lower-inner quadrant of right breast [facility]) Staging form: Breast, AJCC 8th Edition - Pathologic stage from 12/23/2020: Stage IA (pT1c, pN0, cM0, G2, ER+, PR+, HER2-) - Signed by Lloyd Huger, MD on 12/23/2020 Stage prefix: Initial diagnosis Histologic grading system: 3 grade system   Amanda Hawking, NP   01/15/2022 3:03 PM

## 2022-01-15 NOTE — Progress Notes (Signed)
Patient states she sweats a lot. Especially  at night.

## 2022-02-05 ENCOUNTER — Other Ambulatory Visit: Payer: Self-pay | Admitting: Oncology

## 2022-06-15 ENCOUNTER — Other Ambulatory Visit: Payer: Self-pay | Admitting: Oncology

## 2022-07-13 NOTE — Progress Notes (Unsigned)
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  Oncotype Dx score of 13 which is low risk.  INTERVAL HISTORY: Patient returns to clinic today for routine 30-monthevaluation.  She continues to tolerate letrozole well without significant side effects. She has no neurologic complaints.  She denies any recent fevers or illnesses.  She has a good appetite and denies weight loss.  She has no chest pain, shortness of breath, cough, or hemoptysis.  She denies any nausea, vomiting, constipation, or diarrhea.  She has no urinary complaints.  Patient offers no specific complaints today.  REVIEW OF SYSTEMS:   Review of Systems  Constitutional: Negative.  Negative for fever, malaise/fatigue and weight loss.  Respiratory: Negative.  Negative for cough, hemoptysis and shortness of breath.   Cardiovascular: Negative.  Negative for chest pain and leg swelling.  Gastrointestinal: Negative.  Negative for abdominal pain.  Genitourinary: Negative.  Negative for dysuria.  Musculoskeletal: Negative.  Negative for back pain.  Skin: Negative.  Negative for rash.  Neurological: Negative.  Negative for dizziness, focal weakness, weakness and headaches.  Psychiatric/Behavioral: Negative.  The patient is not nervous/anxious.     As per HPI. Otherwise, a complete review of systems is negative.  PAST MEDICAL  HISTORY: Past Medical History:  Diagnosis Date   Anxiety    Arthritis    Complication of anesthesia    Difficulty waking up.   Cough    Depression    GERD (gastroesophageal reflux disease)    Hyperlipidemia    Hypertension    Thyroid nodule    Torn meniscus    R     PAST SURGICAL HISTORY: Past Surgical History:  Procedure Laterality Date   BREAST BIOPSY Left 09/2015   [place]  NE[place]    BREAST BIOPSY Right    both core and excisional done, multiples for fibroadenomas   BREAST BI[facility]SY [facility]26/2020   [facility]  BREAST EXCISIONAL BIOPSY Left 2014   fibroadenomas   [facility]  BREAST LUMPECTOMY WITH NEEDLE LOCALIZATION AND AXILLARY SENTINEL LYMPH NODE BX Right 12/07/2020   Procedure: BREAST LUMPECTOMY WITH NEEDLE LOCALIZATION AND AXILLARY SENTINEL LYMPH NODE BX;  Surgeon: BRobert Bellow MD;  Location: [facility] ORS;  Service: General;  Laterality: Right;   CESAREAN SECTION     CYSTOSCOPY N/A 07/09/2018   Procedure: CYSTOSCOPY;  Surgeon: JWill Bonnet MD;  Location: [facility] ORS;  Service: Gynecology;  Laterality: N/A;   ROBOTIC ASSISTED LAPAROSCOPIC OVARIAN CYSTECTOMY Bilateral 07/09/2018   Procedure: ROBOTIC ASSISTED LAPAROSCOPIC OVARIAN CYSTECTOMY;  Surgeon: JWill Bonnet MD;  Location: [facility] ORS;  Service: Gynecology;  Laterality: Bilateral;   ROBOTIC ASSISTED TOTAL HYSTERECTOMY WITH BILATERAL SALPINGO OOPHERECTOMY Bilateral 07/09/2018   Procedure: ROBOTIC ASSISTED TOTAL HYSTERECTOMY WITH BILATERAL SALPINGO OOPHORECTOMY;  Surgeon: JWill Bonnet MD;  Location: [facility] ORS;  Service: Gynecology;  Laterality: Bilateral;   TONSILLECTOMY      FAMILY HISTORY: Family History  Problem Relation Age of Onset   Diabetes Maternal Aunt    Hypertension Maternal Aunt    Prostate cancer Father    Heart failure Father    Pancreatic cancer Paternal Aunt    Breast cancer Neg Hx    Bladder Cancer Neg Hx    Kidney cancer Neg Hx      ADVANCED DIRECTIVES (Y/N):  N  HEALTH MAINTENANCE: Social History   Tobacco Use   Smoking status: Former    Years: 25.00    Types: Cigarettes    Quit date: 03/19/2002    Years since quitting: 20.3   Smokeless tobacco: Never  Vaping Use   Vaping Use: Never used  Substance Use Topics   Alcohol use: No   Drug use: No     Colonoscopy:  PAP:  Bone density:  Lipid panel:  Allergies  Allergen Reactions   Norco [Hydrocodone-Acetaminophen] Nausea And Vomiting   Penicillin G     Childhood allergy Has patient had a PCN reaction causing immediate rash, facial/tongue/throat swelling, SOB or lightheadedness with hypotension: Unknown Has patient had a PCN reaction causing severe rash involving mucus membranes or skin necrosis: Unknown Has patient had a PCN reaction that required hospitalization: Unknown Has patient had a PCN reaction occurring within the last 10 years: No If all of the above answers are "NO", then may proceed with Cephalosporin use.    Tape Rash    Current Outpatient Medications  Medication Sig Dispense Refill   DULoxetine (CYMBALTA) 30 MG capsule Take 30 mg by mouth daily. Take with 30m tab for a total of 90 mg     enalapril (VASOTEC) 10 MG tablet Take 10 mg by mouth daily.      letrozole (FEMARA) 2.5 MG tablet TAKE 1 TABLET(2.5 MG) BY MOUTH DAILY 30 tablet 3   meclizine (ANTIVERT) 25 MG tablet Take 25 mg by mouth daily as needed for dizziness.      pregabalin (LYRICA) 150 MG capsule Take 450 mg by mouth 2 (two) times daily.     DULoxetine (CYMBALTA) 60 MG capsule Take 60 mg by mouth daily. Take with 352mtab for a total of 90 mg     hydrochlorothiazide (HYDRODIURIL) 12.5 MG tablet Take 12.5 mg by mouth daily.      pantoprazole (PROTONIX) 40 MG tablet Take 40 mg by mouth daily. (Patient not taking: Reported on 07/17/2022)     pravastatin (PRAVACHOL) 40 MG tablet Take 40 mg by mouth at bedtime.  (Patient not taking: Reported on 07/17/2022)     valACYclovir  (VALTREX) 500 MG tablet Take 500 mg by mouth daily. (Patient not taking: Reported on 07/17/2022)     No current facility-administered medications for this visit.    OBJECTIVE: Vitals:   07/17/22 1043  BP: (!) 141/69  Pulse: 71  Resp: 18  Temp: (!) 97.5 F (36.4 C)     Body mass index is 26.45 kg/m.    ECOG FS:0 - Asymptomatic  General: Well-developed, well-nourished, no acute distress. Eyes: Pink conjunctiva, anicteric sclera. HEENT: Normocephalic, moist mucous membranes. Breast: Exam deferred today. Lungs: No audible wheezing or coughing. Heart: Regular rate and rhythm. Abdomen: Soft, nontender, no obvious distention. Musculoskeletal: No edema, cyanosis, or clubbing. Neuro: Alert, answering all questions appropriately. Cranial nerves grossly intact. Skin: No rashes or petechiae noted. Psych: Normal affect.  LAB RESULTS:  Lab Results  Component Value Date   NA 141 11/29/2020   K 3.2 (L) 11/29/2020   CL 102 11/29/2020   CO2 30  11/29/2020   GLUCOSE 106 (H) 11/29/2020   BUN 15 11/29/2020   CREATININE 0.95 11/29/2020   CALCIUM 9.0 11/29/2020   PROT 6.9 07/09/2018   ALBUMIN 3.3 (L) 07/09/2018   AST 32 07/09/2018   ALT 12 07/09/2018   ALKPHOS 51 07/09/2018   BILITOT 0.4 07/09/2018   GFRNONAA >60 11/29/2020   GFRAA 58 (L) 07/10/2018    Lab Results  Component Value Date   WBC 4.9 03/02/2021   HGB 12.4 03/02/2021   HCT 38.8 03/02/2021   MCV 86.2 03/02/2021   PLT 242 03/02/2021     STUDIES: No results found.  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  Oncotype DX 13.  PLAN:    1. 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  Oncotype DX score is 13 which is considered low risk, therefore patient did not require adjuvant chemotherapy.  She completed adjuvant XRT on  March 09, 2021.  Continue letrozole for minimum of 5 years completing treatment in March 2027.  No further intervention is needed at this time.  Patient's most recent mammogram on October 06, 2021 was reported as BI-RADS 2.  Repeat in October 2023.  Return to clinic in 6 months for routine evaluation.   2. Bone health: Patient had a bone mineral density on March 20, 2021 which reported T score of -0.5 which is considered normal.  Repeat in April 2024.  I spent a total of 20 minutes reviewing chart data, face-to-face evaluation with the patient, counseling and coordination of care as detailed above.   Patient expressed understanding and was in agreement with this plan. She also understands that She can call clinic at any time with any questions, concerns, or complaints.    Cancer Staging  Carcinoma of lower-inner quadrant of right breast [facility]) Staging form: Breast, AJCC 8th Edition - Pathologic stage from 12/23/2020: Stage IA (pT1c, pN0, cM0, G2, ER+, PR+, HER2-) - Signed by Lloyd Huger, MD on 12/23/2020 Stage prefix: Initial diagnosis Histologic grading system: 3 grade system   Lloyd Huger, MD   07/18/2022 1:45 PM

## 2022-07-17 ENCOUNTER — Inpatient Hospital Stay: Payer: No Typology Code available for payment source | Attending: Oncology | Admitting: Oncology

## 2022-07-17 ENCOUNTER — Ambulatory Visit: Payer: Medicare Other | Admitting: Oncology

## 2022-07-17 ENCOUNTER — Encounter: Payer: Self-pay | Admitting: Oncology

## 2022-07-17 VITALS — BP 141/69 | HR 71 | Temp 97.5°F | Resp 18 | Wt 140.0 lb

## 2022-07-17 DIAGNOSIS — Z170 Estrogen receptor positive status [ER+]: Secondary | ICD-10-CM | POA: Diagnosis not present

## 2022-07-17 DIAGNOSIS — Z79811 Long term (current) use of aromatase inhibitors: Secondary | ICD-10-CM | POA: Diagnosis not present

## 2022-07-17 DIAGNOSIS — Z87891 Personal history of nicotine dependence: Secondary | ICD-10-CM | POA: Insufficient documentation

## 2022-07-17 DIAGNOSIS — C50311 Malignant neoplasm of lower-inner quadrant of right female breast: Secondary | ICD-10-CM | POA: Diagnosis not present

## 2022-07-17 NOTE — Progress Notes (Unsigned)
Patient denies any concerns today.  

## 2022-09-12 ENCOUNTER — Ambulatory Visit
Admission: RE | Admit: 2022-09-12 | Discharge: 2022-09-12 | Disposition: A | Discharge status: Home / Self Care | Payer: No Typology Code available for payment source | Source: Ambulatory Visit | Attending: Radiation Oncology | Admitting: Radiation Oncology

## 2022-09-12 VITALS — BP 143/67 | HR 79 | Temp 97.2°F | Resp 16 | Ht 60.0 in | Wt 128.4 lb

## 2022-09-12 DIAGNOSIS — C50311 Malignant neoplasm of lower-inner quadrant of right female breast: Secondary | ICD-10-CM

## 2022-09-12 DIAGNOSIS — Z79811 Long term (current) use of aromatase inhibitors: Secondary | ICD-10-CM | POA: Insufficient documentation

## 2022-09-12 DIAGNOSIS — C50211 Malignant neoplasm of upper-inner quadrant of right female breast: Secondary | ICD-10-CM | POA: Diagnosis not present

## 2022-09-12 DIAGNOSIS — Z923 Personal history of irradiation: Secondary | ICD-10-CM | POA: Insufficient documentation

## 2022-09-12 DIAGNOSIS — Z170 Estrogen receptor positive status [ER+]: Secondary | ICD-10-CM | POA: Diagnosis not present

## 2022-09-12 NOTE — Progress Notes (Signed)
Radiation Oncology Follow up Note  Name: Amanda Hardin   Date:   09/12/2022 MRN:  [id number] DOB: 1956/04/22    This 66 y.o. female presents to the clinic today for 32-monthfollow-up status post whole breast radiation to her right breast for stage Ia (T1 cN0 M0) ER/PR positive invasive mammary carcinoma.  REFERRING PROVIDER: BDerinda Late MD  HPI: Patient is a 66year old female now out 18 months having completed whole breast radiation to her right breast for stage Ia ER/PR positive invasive mammary carcinoma seen today in routine follow-up she is doing well.  She specifically denies breast tenderness cough or bone pain.  She is currently on letrozole tolerating it well without side effect.  She has not had mammograms since last October.  She is seeing Dr. FGrayland Ormondtoday and will have mammograms reordered for this November..  COMPLICATIONS OF TREATMENT: none  FOLLOW UP COMPLIANCE: keeps appointments   PHYSICAL EXAM:  BP (!) 143/67   Pulse 79   Temp (!) 97.2 F (36.2 C)   Resp 16   Ht 5' (1.524 m)   Wt 128 lb 6.4 oz (58.2 kg)   BMI 25.08 kg/m  Lungs are clear to A&P cardiac examination essentially unremarkable with regular rate and rhythm. No dominant mass or nodularity is noted in either breast in 2 positions examined. Incision is well-healed. No axillary or supraclavicular adenopathy is appreciated. Cosmetic result is excellent.  Well-developed well-nourished patient in NAD. HEENT reveals PERLA, EOMI, discs not visualized.  Oral cavity is clear. No oral mucosal lesions are identified. Neck is clear without evidence of cervical or supraclavicular adenopathy. Lungs are clear to A&P. Cardiac examination is essentially unremarkable with regular rate and rhythm without murmur rub or thrill. Abdomen is benign with no organomegaly or masses noted. Motor sensory and DTR levels are equal and symmetric in the upper and lower extremities. Cranial nerves II through XII are grossly intact.  Proprioception is intact. No peripheral adenopathy or edema is identified. No motor or sensory levels are noted. Crude visual fields are within normal range.  RADIOLOGY RESULTS: No current films for review  PLAN: Present time patient is doing well now out 18 months from whole breast radiation.  And pleased with her overall progress.  I have asked to see her back in 1 year for follow-up.  She continues on letrozole without side effect.  She already has mammograms ordered for next month.  I would like to take this opportunity to thank you for allowing me to participate in the care of your patient..Noreene Filbert MD

## 2022-09-19 DIAGNOSIS — E1169 Type 2 diabetes mellitus with other specified complication: Secondary | ICD-10-CM | POA: Diagnosis not present

## 2022-09-19 DIAGNOSIS — E663 Overweight: Secondary | ICD-10-CM | POA: Diagnosis not present

## 2022-09-19 DIAGNOSIS — Z008 Encounter for other general examination: Secondary | ICD-10-CM | POA: Diagnosis not present

## 2022-09-19 DIAGNOSIS — R2681 Unsteadiness on feet: Secondary | ICD-10-CM | POA: Diagnosis not present

## 2022-09-19 DIAGNOSIS — Z6828 Body mass index (BMI) 28.0-28.9, adult: Secondary | ICD-10-CM | POA: Diagnosis not present

## 2022-09-19 DIAGNOSIS — C50311 Malignant neoplasm of lower-inner quadrant of right female breast: Secondary | ICD-10-CM | POA: Diagnosis not present

## 2022-09-19 DIAGNOSIS — I10 Essential (primary) hypertension: Secondary | ICD-10-CM | POA: Diagnosis not present

## 2022-09-19 DIAGNOSIS — F17211 Nicotine dependence, cigarettes, in remission: Secondary | ICD-10-CM | POA: Diagnosis not present

## 2022-09-19 DIAGNOSIS — F324 Major depressive disorder, single episode, in partial remission: Secondary | ICD-10-CM | POA: Diagnosis not present

## 2022-09-19 DIAGNOSIS — E785 Hyperlipidemia, unspecified: Secondary | ICD-10-CM | POA: Diagnosis not present

## 2022-10-17 DIAGNOSIS — R251 Tremor, unspecified: Secondary | ICD-10-CM | POA: Diagnosis not present

## 2022-10-17 DIAGNOSIS — G500 Trigeminal neuralgia: Secondary | ICD-10-CM | POA: Diagnosis not present

## 2022-10-17 DIAGNOSIS — G479 Sleep disorder, unspecified: Secondary | ICD-10-CM | POA: Diagnosis not present

## 2022-10-17 DIAGNOSIS — R413 Other amnesia: Secondary | ICD-10-CM | POA: Diagnosis not present

## 2022-10-17 DIAGNOSIS — G5712 Meralgia paresthetica, left lower limb: Secondary | ICD-10-CM | POA: Diagnosis not present

## 2022-10-22 DIAGNOSIS — G3184 Mild cognitive impairment, so stated: Secondary | ICD-10-CM | POA: Diagnosis not present

## 2022-10-22 DIAGNOSIS — I10 Essential (primary) hypertension: Secondary | ICD-10-CM | POA: Diagnosis not present

## 2022-10-22 DIAGNOSIS — I152 Hypertension secondary to endocrine disorders: Secondary | ICD-10-CM | POA: Diagnosis not present

## 2022-10-22 DIAGNOSIS — F3341 Major depressive disorder, recurrent, in partial remission: Secondary | ICD-10-CM | POA: Diagnosis not present

## 2022-10-22 DIAGNOSIS — E1159 Type 2 diabetes mellitus with other circulatory complications: Secondary | ICD-10-CM | POA: Diagnosis not present

## 2022-11-16 ENCOUNTER — Inpatient Hospital Stay: Admission: RE | Admit: 2022-11-16 | Payer: No Typology Code available for payment source | Source: Ambulatory Visit

## 2022-11-27 DIAGNOSIS — Z170 Estrogen receptor positive status [ER+]: Secondary | ICD-10-CM | POA: Diagnosis not present

## 2022-11-27 DIAGNOSIS — C50311 Malignant neoplasm of lower-inner quadrant of right female breast: Secondary | ICD-10-CM | POA: Diagnosis not present

## 2022-11-28 DIAGNOSIS — H43811 Vitreous degeneration, right eye: Secondary | ICD-10-CM | POA: Diagnosis not present

## 2022-11-30 ENCOUNTER — Ambulatory Visit
Admission: RE | Admit: 2022-11-30 | Discharge: 2022-11-30 | Disposition: A | Discharge status: Home / Self Care | Payer: No Typology Code available for payment source | Source: Ambulatory Visit | Attending: Oncology | Admitting: Oncology

## 2022-11-30 DIAGNOSIS — Z170 Estrogen receptor positive status [ER+]: Secondary | ICD-10-CM | POA: Insufficient documentation

## 2022-11-30 DIAGNOSIS — Z853 Personal history of malignant neoplasm of breast: Secondary | ICD-10-CM | POA: Diagnosis not present

## 2022-11-30 DIAGNOSIS — Z0100 Encounter for examination of eyes and vision without abnormal findings: Secondary | ICD-10-CM | POA: Diagnosis not present

## 2022-11-30 DIAGNOSIS — C50311 Malignant neoplasm of lower-inner quadrant of right female breast: Secondary | ICD-10-CM | POA: Diagnosis not present

## 2022-12-07 DIAGNOSIS — E7800 Pure hypercholesterolemia, unspecified: Secondary | ICD-10-CM | POA: Diagnosis not present

## 2022-12-07 DIAGNOSIS — E1142 Type 2 diabetes mellitus with diabetic polyneuropathy: Secondary | ICD-10-CM | POA: Diagnosis not present

## 2022-12-07 DIAGNOSIS — E538 Deficiency of other specified B group vitamins: Secondary | ICD-10-CM | POA: Diagnosis not present

## 2022-12-07 DIAGNOSIS — Z79899 Other long term (current) drug therapy: Secondary | ICD-10-CM | POA: Diagnosis not present

## 2022-12-14 DIAGNOSIS — Z79899 Other long term (current) drug therapy: Secondary | ICD-10-CM | POA: Diagnosis not present

## 2022-12-14 DIAGNOSIS — R7989 Other specified abnormal findings of blood chemistry: Secondary | ICD-10-CM | POA: Diagnosis not present

## 2022-12-14 DIAGNOSIS — Z0000 Encounter for general adult medical examination without abnormal findings: Secondary | ICD-10-CM | POA: Diagnosis not present

## 2022-12-26 DIAGNOSIS — G500 Trigeminal neuralgia: Secondary | ICD-10-CM | POA: Diagnosis not present

## 2022-12-26 DIAGNOSIS — R251 Tremor, unspecified: Secondary | ICD-10-CM | POA: Diagnosis not present

## 2022-12-26 DIAGNOSIS — G5712 Meralgia paresthetica, left lower limb: Secondary | ICD-10-CM | POA: Diagnosis not present

## 2022-12-26 DIAGNOSIS — R413 Other amnesia: Secondary | ICD-10-CM | POA: Diagnosis not present

## 2022-12-26 DIAGNOSIS — E559 Vitamin D deficiency, unspecified: Secondary | ICD-10-CM | POA: Diagnosis not present

## 2022-12-26 DIAGNOSIS — E1142 Type 2 diabetes mellitus with diabetic polyneuropathy: Secondary | ICD-10-CM | POA: Diagnosis not present

## 2022-12-26 DIAGNOSIS — E538 Deficiency of other specified B group vitamins: Secondary | ICD-10-CM | POA: Diagnosis not present

## 2022-12-26 DIAGNOSIS — G479 Sleep disorder, unspecified: Secondary | ICD-10-CM | POA: Diagnosis not present

## 2023-01-17 ENCOUNTER — Inpatient Hospital Stay: Payer: No Typology Code available for payment source | Admitting: Nurse Practitioner

## 2023-01-21 ENCOUNTER — Encounter: Payer: Self-pay | Admitting: Oncology

## 2023-01-21 ENCOUNTER — Inpatient Hospital Stay: Payer: No Typology Code available for payment source | Admitting: Nurse Practitioner

## 2023-01-21 ENCOUNTER — Inpatient Hospital Stay: Payer: No Typology Code available for payment source | Attending: Nurse Practitioner | Admitting: Oncology

## 2023-01-21 VITALS — BP 139/80 | HR 78 | Temp 96.6°F | Resp 16 | Ht 60.0 in | Wt 113.0 lb

## 2023-01-21 DIAGNOSIS — Z79899 Other long term (current) drug therapy: Secondary | ICD-10-CM | POA: Insufficient documentation

## 2023-01-21 DIAGNOSIS — Z170 Estrogen receptor positive status [ER+]: Secondary | ICD-10-CM | POA: Diagnosis not present

## 2023-01-21 DIAGNOSIS — C50311 Malignant neoplasm of lower-inner quadrant of right female breast: Secondary | ICD-10-CM | POA: Insufficient documentation

## 2023-01-21 DIAGNOSIS — Z87891 Personal history of nicotine dependence: Secondary | ICD-10-CM | POA: Insufficient documentation

## 2023-01-21 NOTE — Progress Notes (Signed)
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  Oncotype Dx score of 13 which is low risk.  INTERVAL HISTORY: Patient returns to clinic today for routine 70-monthevaluation.  She complains of significant decline in memory, but otherwise feels well.  She is tolerating letrozole without significant side effects.  She has no neurologic complaints.  She denies any recent fevers or illnesses.  She has a good appetite, but admits to some and unintentional weight loss.  She has no chest pain, shortness of breath, cough, or hemoptysis.  She denies any nausea, vomiting, constipation, or diarrhea.  She has no urinary complaints.  Patient offers no further specific complaints today.  REVIEW OF SYSTEMS:   Review of Systems  Constitutional: Negative.  Negative for fever, malaise/fatigue and weight loss.  Respiratory: Negative.  Negative for cough, hemoptysis and shortness of breath.   Cardiovascular: Negative.  Negative for chest pain and leg swelling.  Gastrointestinal: Negative.  Negative for abdominal pain.  Genitourinary: Negative.  Negative for dysuria.  Musculoskeletal: Negative.  Negative for back pain.  Skin: Negative.  Negative for rash.  Neurological: Negative.  Negative for dizziness, focal weakness, weakness and headaches.  Psychiatric/Behavioral: Negative.  The patient is not  nervous/anxious.     As per HPI. Otherwise, a complete review of systems is negative.  PAST MEDICAL HISTORY: Past Medical History:  Diagnosis Date   Anxiety    Arthritis    Complication of anesthesia    Difficulty waking up.   Cough    Depression    GERD (gastroesophageal reflux disease)    Hyperlipidemia    Hypertension    Thyroid nodule    Torn meniscus    R     PAST SURGICAL HISTORY: Past Surgical History:  Procedure Laterality Date   BREAST BIOPSY Left 09/2015   [place]  NE[place]    BREAST BIOPSY Right    both core and excisional done, multiples for fibroadenomas   [facility]SY [facility]26/2020   [facility]  BREAST EXCISIONAL BIOPSY Left 2014   fibroadenomas   [facility]  BREAST LUMPECTOMY WITH NEEDLE LOCALIZATION AND AXILLARY SENTINEL LYMPH NODE BX Right 12/07/2020   Procedure: BREAST LUMPECTOMY WITH NEEDLE LOCALIZATION AND AXILLARY SENTINEL LYMPH NODE BX;  Surgeon: BRobert Bellow MD;  Location: [facility] ORS;  Service: General;  Laterality: Right;   CESAREAN SECTION     CYSTOSCOPY N/A 07/09/2018   Procedure: CYSTOSCOPY;  Surgeon: JWill Bonnet MD;  Location: [facility] ORS;  Service: Gynecology;  Laterality: N/A;   ROBOTIC ASSISTED LAPAROSCOPIC OVARIAN CYSTECTOMY Bilateral 07/09/2018   Procedure: ROBOTIC ASSISTED LAPAROSCOPIC OVARIAN CYSTECTOMY;  Surgeon: JWill Bonnet MD;  Location: [facility] ORS;  Service: Gynecology;  Laterality: Bilateral;   ROBOTIC ASSISTED TOTAL HYSTERECTOMY WITH BILATERAL SALPINGO OOPHERECTOMY Bilateral 07/09/2018   Procedure: ROBOTIC ASSISTED TOTAL HYSTERECTOMY WITH BILATERAL SALPINGO OOPHORECTOMY;  Surgeon: JWill Bonnet MD;  Location: [facility] ORS;  Service: Gynecology;  Laterality: Bilateral;   TONSILLECTOMY      FAMILY HISTORY: Family History  Problem Relation Age of Onset   Diabetes Maternal Aunt    Hypertension Maternal Aunt    Prostate cancer Father    Heart failure Father     Pancreatic cancer Paternal Aunt    Breast cancer Neg Hx    Bladder Cancer Neg Hx    Kidney cancer Neg Hx     ADVANCED DIRECTIVES (Y/N):  N  HEALTH MAINTENANCE: Social History   Tobacco Use   Smoking status: Former    Years: 25.00    Types: Cigarettes    Quit date: 03/19/2002    Years since quitting: 20.8   Smokeless tobacco: Never  Vaping Use   Vaping Use: Never used  Substance Use Topics   Alcohol use: No   Drug use: No     Colonoscopy:  PAP:  Bone density:  Lipid panel:  Allergies  Allergen Reactions   Norco [Hydrocodone-Acetaminophen] Nausea And Vomiting   Penicillin G     Childhood allergy Has patient had a PCN reaction causing immediate rash, facial/tongue/throat swelling, SOB or lightheadedness with hypotension: Unknown Has patient had a PCN reaction causing severe rash involving mucus membranes or skin necrosis: Unknown Has patient had a PCN reaction that required hospitalization: Unknown Has patient had a PCN reaction occurring within the last 10 years: No If all of the above answers are "NO", then may proceed with Cephalosporin use.    Tape Rash    Current Outpatient Medications  Medication Sig Dispense Refill   donepezil (ARICEPT) 10 MG tablet Take 1 tablet by mouth at bedtime.     enalapril (VASOTEC) 10 MG tablet Take 10 mg by mouth daily.      gabapentin (NEURONTIN) 300 MG capsule Take 1 capsule by mouth at bedtime.     hydrochlorothiazide (HYDRODIURIL) 12.5 MG tablet Take 12.5 mg by mouth daily.      letrozole (FEMARA) 2.5 MG tablet TAKE 1 TABLET(2.5 MG) BY MOUTH DAILY 30 tablet 3   meclizine (ANTIVERT) 25 MG tablet Take 25 mg by mouth daily as needed for dizziness.      pregabalin (LYRICA) 150 MG capsule Take 450 mg by mouth 2 (two) times daily.     DULoxetine (CYMBALTA) 30 MG capsule Take 30 mg by mouth daily. Take with '60mg'$  tab for a total of 90 mg (Patient not taking: Reported on 01/21/2023)     DULoxetine (CYMBALTA) 60 MG capsule Take 60 mg by mouth  daily. Take with '30mg'$  tab for a total of 90 mg (Patient not taking: Reported on 01/21/2023)     pantoprazole (PROTONIX) 40 MG tablet Take 40 mg by mouth daily. (Patient not taking: Reported on 07/17/2022)     pravastatin (PRAVACHOL) 40 MG tablet Take 40 mg by mouth at bedtime.  (Patient not taking: Reported on 07/17/2022)     valACYclovir (VALTREX) 500 MG tablet Take 500 mg by mouth daily. (Patient not taking: Reported on 07/17/2022)     No current facility-administered medications for this visit.    OBJECTIVE: Vitals:   01/21/23 1039  BP: 139/80  Pulse: 78  Resp: 16  Temp: (!) 96.6 F (35.9 C)  SpO2: 100%     Body mass index is 22.07 kg/m.    ECOG FS:0 - Asymptomatic  General: Well-developed, well-nourished, no acute distress. Eyes: Pink conjunctiva, anicteric sclera. HEENT: Normocephalic, moist mucous membranes. Lungs: No audible wheezing or coughing. Heart: Regular rate and rhythm. Abdomen: Soft, nontender, no  obvious distention. Musculoskeletal: No edema, cyanosis, or clubbing. Neuro: Alert, answering all questions appropriately. Cranial nerves grossly intact. Skin: No rashes or petechiae noted. Psych: Normal affect.  LAB RESULTS:  Lab Results  Component Value Date   NA 141 11/29/2020   K 3.2 (L) 11/29/2020   CL 102 11/29/2020   CO2 30 11/29/2020   GLUCOSE 106 (H) 11/29/2020   BUN 15 11/29/2020   CREATININE 0.95 11/29/2020   CALCIUM 9.0 11/29/2020   PROT 6.9 07/09/2018   ALBUMIN 3.3 (L) 07/09/2018   AST 32 07/09/2018   ALT 12 07/09/2018   ALKPHOS 51 07/09/2018   BILITOT 0.4 07/09/2018   GFRNONAA >60 11/29/2020   GFRAA 58 (L) 07/10/2018    Lab Results  Component Value Date   WBC 4.9 03/02/2021   HGB 12.4 03/02/2021   HCT 38.8 03/02/2021   MCV 86.2 03/02/2021   PLT 242 03/02/2021     STUDIES: No results found.  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  Oncotype DX  13.  PLAN:    1. 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  Oncotype DX score is 13 which is considered low risk, therefore patient did not require adjuvant chemotherapy.  She completed adjuvant XRT on March 09, 2021.  Continue letrozole for minimum of 5 years completing treatment in March 2027.  No further intervention is needed at this time.  Patient's most recent mammogram on November 30, 2022 was reported as BI-RADS 2.  Repeat in December 2024.  Return to clinic in 6 months for routine evaluation.    2. Bone health: Patient had a bone mineral density on March 20, 2021 which reported T score of -0.5 which is considered normal.  Repeat in April 2024. 3.  Memory loss: Continue Aricept as prescribed.  Follow-up with primary care as indicated.  Patient expressed understanding and was in agreement with this plan. She also understands that She can call clinic at any time with any questions, concerns, or complaints.    Cancer Staging  Carcinoma of lower-inner quadrant of right breast [facility]) Staging form: Breast, AJCC 8th Edition - Pathologic stage from 12/23/2020: Stage IA (pT1c, pN0, cM0, G2, ER+, PR+, HER2-) - Signed by Lloyd Huger, MD on 12/23/2020 Stage prefix: Initial diagnosis Histologic grading system: 3 grade system   Lloyd Huger, MD   01/21/2023 11:17 AM

## 2023-03-07 DIAGNOSIS — Z79899 Other long term (current) drug therapy: Secondary | ICD-10-CM | POA: Diagnosis not present

## 2023-03-07 DIAGNOSIS — R946 Abnormal results of thyroid function studies: Secondary | ICD-10-CM | POA: Diagnosis not present

## 2023-03-07 DIAGNOSIS — E1142 Type 2 diabetes mellitus with diabetic polyneuropathy: Secondary | ICD-10-CM | POA: Diagnosis not present

## 2023-03-16 ENCOUNTER — Other Ambulatory Visit: Payer: Self-pay | Admitting: Oncology

## 2023-03-25 ENCOUNTER — Other Ambulatory Visit: Payer: No Typology Code available for payment source

## 2023-04-17 DIAGNOSIS — M7062 Trochanteric bursitis, left hip: Secondary | ICD-10-CM | POA: Diagnosis not present

## 2023-04-17 DIAGNOSIS — M76892 Other specified enthesopathies of left lower limb, excluding foot: Secondary | ICD-10-CM | POA: Diagnosis not present

## 2023-04-17 DIAGNOSIS — M25552 Pain in left hip: Secondary | ICD-10-CM | POA: Diagnosis not present

## 2023-04-23 ENCOUNTER — Ambulatory Visit: Payer: No Typology Code available for payment source | Admitting: Internal Medicine

## 2023-04-25 ENCOUNTER — Encounter: Payer: Self-pay | Admitting: Internal Medicine

## 2023-04-25 ENCOUNTER — Ambulatory Visit (INDEPENDENT_AMBULATORY_CARE_PROVIDER_SITE_OTHER): Payer: No Typology Code available for payment source | Admitting: Internal Medicine

## 2023-04-25 VITALS — BP 120/68 | HR 73 | Ht 60.0 in | Wt 109.0 lb

## 2023-04-25 DIAGNOSIS — M1990 Unspecified osteoarthritis, unspecified site: Secondary | ICD-10-CM | POA: Insufficient documentation

## 2023-04-25 DIAGNOSIS — F411 Generalized anxiety disorder: Secondary | ICD-10-CM | POA: Diagnosis not present

## 2023-04-25 DIAGNOSIS — E782 Mixed hyperlipidemia: Secondary | ICD-10-CM

## 2023-04-25 DIAGNOSIS — Z1389 Encounter for screening for other disorder: Secondary | ICD-10-CM | POA: Diagnosis not present

## 2023-04-25 DIAGNOSIS — R634 Abnormal weight loss: Secondary | ICD-10-CM | POA: Insufficient documentation

## 2023-04-25 DIAGNOSIS — I10 Essential (primary) hypertension: Secondary | ICD-10-CM

## 2023-04-25 DIAGNOSIS — F01B4 Vascular dementia, moderate, with anxiety: Secondary | ICD-10-CM

## 2023-04-25 DIAGNOSIS — G500 Trigeminal neuralgia: Secondary | ICD-10-CM

## 2023-04-25 DIAGNOSIS — E538 Deficiency of other specified B group vitamins: Secondary | ICD-10-CM

## 2023-04-25 DIAGNOSIS — E559 Vitamin D deficiency, unspecified: Secondary | ICD-10-CM

## 2023-04-25 LAB — POCT URINALYSIS DIPSTICK
Bilirubin, UA: NEGATIVE
Blood, UA: NEGATIVE
Glucose, UA: NEGATIVE
Ketones, UA: NEGATIVE
Leukocytes, UA: NEGATIVE
Nitrite, UA: NEGATIVE
Protein, UA: NEGATIVE
Spec Grav, UA: 1.015 (ref 1.010–1.025)
Urobilinogen, UA: 0.2 E.U./dL
pH, UA: 8.5 — AB (ref 5.0–8.0)

## 2023-04-25 MED ORDER — DONEPEZIL HCL 10 MG PO TABS
10.0000 mg | ORAL_TABLET | Freq: Every day | ORAL | 11 refills | Status: AC
Start: 2023-04-25 — End: 2024-04-24

## 2023-04-25 MED ORDER — DULOXETINE HCL 30 MG PO CPEP
30.0000 mg | ORAL_CAPSULE | Freq: Every day | ORAL | 3 refills | Status: AC
Start: 2023-04-25 — End: ?

## 2023-04-25 NOTE — Progress Notes (Signed)
New Patient Office Visit  Subjective    Patient ID: Amanda Hardin, female    DOB: 23-Feb-1956  Age: 67 y.o. MRN: [id number]  CC:  Chief Complaint  Patient presents with   Establish Care    New Patient    HPI Amanda Hardin presents to establish care Previous Primary Care provider/office:   she does have additional concerns to discuss today.   Patient comes in to establish primary care again.  She was a patient here in the past but not in since February 2020.  She is accompanied by a friend/neighbor.  Patient's son who lives in [place] was also on the phone briefly during this visit. Patient has several medical problems and is under the care of of oncologist for her breast cancer which was diagnosed in 2021.  She is being taken care of by orthopedics for her left hip pain, and by neurologist for her trigeminal neuralgia and memory impairment.  Patient has been prescribed several medications to help with her medical issues but she is not taking most of them.  She decided she is not going to take Cymbalta, pravastatin, Aricept.  She takes her gabapentin and Lyrica when she feels the need for it. Patient has lost significant amount of weight over the last 4 years and especially over last 1 year.  There is concerned that she forgets to eat her meals or even prepare them.  With her memory impairment she got lost while driving, but she has not burned her food yet.  Her son is concerned about her memory and her activities of daily living, and would like help with setting up home health agency. Today patient reports of the left hip pain but otherwise she has no nausea or vomiting, no chest pain, no diarrhea or constipation.    Outpatient Encounter Medications as of 04/25/2023  Medication Sig   enalapril (VASOTEC) 10 MG tablet Take 10 mg by mouth daily.    gabapentin (NEURONTIN) 300 MG capsule Take 1 capsule by mouth at bedtime.   letrozole (FEMARA) 2.5 MG tablet TAKE 1 TABLET(2.5 MG) BY MOUTH  DAILY   meclizine (ANTIVERT) 25 MG tablet Take 25 mg by mouth daily as needed for dizziness.    pregabalin (LYRICA) 150 MG capsule Take 450 mg by mouth 2 (two) times daily.   donepezil (ARICEPT) 10 MG tablet Take 1 tablet (10 mg total) by mouth at bedtime.   DULoxetine (CYMBALTA) 30 MG capsule Take 1 capsule (30 mg total) by mouth daily. Take with 60mg  tab for a total of 90 mg   hydrochlorothiazide (HYDRODIURIL) 12.5 MG tablet Take 12.5 mg by mouth daily.    pravastatin (PRAVACHOL) 40 MG tablet Take 40 mg by mouth at bedtime.  (Patient not taking: Reported on 07/17/2022)   [DISCONTINUED] donepezil (ARICEPT) 10 MG tablet Take 1 tablet by mouth at bedtime. (Patient not taking: Reported on 04/25/2023)   [DISCONTINUED] DULoxetine (CYMBALTA) 30 MG capsule Take 30 mg by mouth daily. Take with 60mg  tab for a total of 90 mg (Patient not taking: Reported on 01/21/2023)   [DISCONTINUED] DULoxetine (CYMBALTA) 60 MG capsule Take 60 mg by mouth daily. Take with 30mg  tab for a total of 90 mg (Patient not taking: Reported on 01/21/2023)   [DISCONTINUED] pantoprazole (PROTONIX) 40 MG tablet Take 40 mg by mouth daily. (Patient not taking: Reported on 07/17/2022)   [DISCONTINUED] valACYclovir (VALTREX) 500 MG tablet Take 500 mg by mouth daily. (Patient not taking: Reported on 07/17/2022)   No facility-administered  encounter medications on file as of 04/25/2023.    Past Medical History:  Diagnosis Date   Anxiety    Arthritis    Complication of anesthesia    Difficulty waking up.   Cough    Depression    GERD (gastroesophageal reflux disease)    Hyperlipidemia    Hypertension    Thyroid nodule    Torn meniscus    R     Past Surgical History:  Procedure Laterality Date   BREAST BIOPSY Left 09/2015   [place] core bx  NEG    BREAST BIOPSY Right    both core and excisional done, multiples for fibroadenomas   BREAST BIOPSY Right 10/26/2020   [facility]   BREAST EXCISIONAL BIOPSY Left 2014   fibroadenomas    BREAST LUMPECTOMY Right 12/07/2020   [facility]   BREAST LUMPECTOMY WITH NEEDLE LOCALIZATION AND AXILLARY SENTINEL LYMPH NODE BX Right 12/07/2020   Procedure: BREAST LUMPECTOMY WITH NEEDLE LOCALIZATION AND AXILLARY SENTINEL LYMPH NODE BX;  Surgeon: Earline Mayotte, MD;  Location: [facility] ORS;  Service: General;  Laterality: Right;   CESAREAN SECTION     CYSTOSCOPY N/A 07/09/2018   Procedure: CYSTOSCOPY;  Surgeon: Conard Novak, MD;  Location: [facility] ORS;  Service: Gynecology;  Laterality: N/A;   ROBOTIC ASSISTED LAPAROSCOPIC OVARIAN CYSTECTOMY Bilateral 07/09/2018   Procedure: ROBOTIC ASSISTED LAPAROSCOPIC OVARIAN CYSTECTOMY;  Surgeon: Conard Novak, MD;  Location: [facility] ORS;  Service: Gynecology;  Laterality: Bilateral;   ROBOTIC ASSISTED TOTAL HYSTERECTOMY WITH BILATERAL SALPINGO OOPHERECTOMY Bilateral 07/09/2018   Procedure: ROBOTIC ASSISTED TOTAL HYSTERECTOMY WITH BILATERAL SALPINGO OOPHORECTOMY;  Surgeon: Conard Novak, MD;  Location: [facility] ORS;  Service: Gynecology;  Laterality: Bilateral;   TONSILLECTOMY      Family History  Problem Relation Age of Onset   Diabetes Maternal Aunt    Hypertension Maternal Aunt    Prostate cancer Father    Heart failure Father    Pancreatic cancer Paternal Aunt    Breast cancer Neg Hx    Bladder Cancer Neg Hx    Kidney cancer Neg Hx     Social History   Socioeconomic History   Marital status: Single    Spouse name: Not on file   Number of children: Not on file   Years of education: Not on file   Highest education level: Not on file  Occupational History   Not on file  Tobacco Use   Smoking status: Former    Years: 25    Types: Cigarettes    Quit date: 03/19/2002    Years since quitting: 21.1   Smokeless tobacco: Never  Vaping Use   Vaping Use: Never used  Substance and Sexual Activity   Alcohol use: No   Drug use: No   Sexual activity: Not Currently    Birth control/protection: Post-menopausal  Other Topics Concern   Not on  file  Social History Narrative   Not on file   Social Determinants of Health   Financial Resource Strain: Not on file  Food Insecurity: Not on file  Transportation Needs: Not on file  Physical Activity: Unknown (06/10/2018)   Exercise Vital Sign    Days of Exercise per Week: 7 days    Minutes of Exercise per Session: Not on file  Stress: Not on file  Social Connections: Not on file  Intimate Partner Violence: Not on file    Review of Systems  Constitutional:  Positive for weight loss. Negative for chills, diaphoresis, fever and malaise/fatigue.  HENT: Negative.    Eyes: Negative.   Respiratory:  Negative for cough, shortness of breath and wheezing.   Cardiovascular:  Positive for leg swelling. Negative for chest pain, palpitations and PND.  Gastrointestinal:  Negative for abdominal pain, constipation, diarrhea, heartburn, melena and vomiting.  Genitourinary: Negative.   Musculoskeletal:  Positive for joint pain. Negative for back pain, falls, myalgias and neck pain.  Neurological:  Positive for tremors and sensory change. Negative for dizziness, tingling, speech change, weakness and headaches.  Psychiatric/Behavioral:  Positive for memory loss and suicidal ideas. Negative for depression. The patient is nervous/anxious. The patient does not have insomnia.         Objective    BP 120/68   Pulse 73   Ht 5' (1.524 m)   Wt 109 lb (49.4 kg)   SpO2 99%   BMI 21.29 kg/m   Physical Exam Vitals and nursing note reviewed.  Constitutional:      Appearance: Normal appearance.  HENT:     Head: Normocephalic and atraumatic.     Right Ear: Tympanic membrane normal.     Left Ear: Tympanic membrane normal.     Mouth/Throat:     Mouth: Mucous membranes are dry.  Cardiovascular:     Rate and Rhythm: Normal rate and regular rhythm.     Pulses: Normal pulses.     Heart sounds: Normal heart sounds. No murmur heard. Pulmonary:     Effort: Pulmonary effort is normal.     Breath  sounds: No wheezing, rhonchi or rales.  Abdominal:     General: Bowel sounds are normal. There is no distension.     Palpations: Abdomen is soft. There is no mass.     Tenderness: There is no abdominal tenderness. There is no guarding.  Musculoskeletal:        General: No swelling or tenderness.     Cervical back: Normal range of motion and neck supple. No rigidity or tenderness.     Right lower leg: No edema.     Left lower leg: No edema.  Skin:    General: Skin is warm.  Neurological:     General: No focal deficit present.     Mental Status: She is alert. Mental status is at baseline.  Psychiatric:        Mood and Affect: Mood normal.        Behavior: Behavior normal.        Assessment & Plan:  Patient advised to keep her follow-up appointment with a neurologist and orthopedic.  Will send in refills for Aricept and Cymbalta which she needs to resume. Check blood work today and adjust medications further. Set up home health services to help with vitals and medication management.  Will see if it works otherwise may consider placement in an assisted living facility. Problem List Items Addressed This Visit     Trigeminal neuralgia - Primary   Relevant Medications   donepezil (ARICEPT) 10 MG tablet   DULoxetine (CYMBALTA) 30 MG capsule   Moderate vascular dementia with anxiety (HCC)   Relevant Medications   donepezil (ARICEPT) 10 MG tablet   DULoxetine (CYMBALTA) 30 MG capsule   GAD (generalized anxiety disorder)   Relevant Medications   DULoxetine (CYMBALTA) 30 MG capsule   Arthritis   Relevant Orders   Arthritis Panel   Weight loss   Relevant Orders   CBC With Differential   TSH+T4F+T3Free   Mixed hyperlipidemia   Relevant Orders   Lipid Panel w/o Chol/HDL  Ratio   Essential hypertension, benign   Relevant Orders   CMP14+EGFR   Vitamin D deficiency   Relevant Orders   Vitamin D (25 hydroxy)   Screening for blood or protein in urine   Relevant Orders   POCT  Urinalysis Dipstick (18841) (Completed)   Other Visit Diagnoses     Vitamin B12 deficiency       Relevant Orders   Vitamin B12       Return in about 10 days (around 05/05/2023).   Total time spent: 45 minutes  Margaretann Loveless, MD  04/25/2023

## 2023-04-26 ENCOUNTER — Ambulatory Visit: Payer: Self-pay | Admitting: Physician Assistant

## 2023-04-26 LAB — CBC WITH DIFFERENTIAL
Basophils Absolute: 0 10*3/uL (ref 0.0–0.2)
Basos: 1 %
EOS (ABSOLUTE): 0.1 10*3/uL (ref 0.0–0.4)
Eos: 1 %
Hematocrit: 39.1 % (ref 34.0–46.6)
Hemoglobin: 12.5 g/dL (ref 11.1–15.9)
Immature Grans (Abs): 0 10*3/uL (ref 0.0–0.1)
Immature Granulocytes: 0 %
Lymphocytes Absolute: 1.4 10*3/uL (ref 0.7–3.1)
Lymphs: 21 %
MCH: 27.2 pg (ref 26.6–33.0)
MCHC: 32 g/dL (ref 31.5–35.7)
MCV: 85 fL (ref 79–97)
Monocytes Absolute: 0.5 10*3/uL (ref 0.1–0.9)
Monocytes: 9 %
Neutrophils Absolute: 4.4 10*3/uL (ref 1.4–7.0)
Neutrophils: 68 %
RBC: 4.6 x10E6/uL (ref 3.77–5.28)
RDW: 13.5 % (ref 11.7–15.4)
WBC: 6.4 10*3/uL (ref 3.4–10.8)

## 2023-04-26 LAB — VITAMIN B12: Vitamin B-12: 691 pg/mL (ref 232–1245)

## 2023-04-26 LAB — TSH+T4F+T3FREE
Free T4: 1.06 ng/dL (ref 0.82–1.77)
T3, Free: 2.6 pg/mL (ref 2.0–4.4)
TSH: 0.443 u[IU]/mL — ABNORMAL LOW (ref 0.450–4.500)

## 2023-04-26 LAB — LIPID PANEL W/O CHOL/HDL RATIO
Cholesterol, Total: 232 mg/dL — ABNORMAL HIGH (ref 100–199)
HDL: 64 mg/dL (ref >39)
LDL Chol Calc (NIH): 132 mg/dL — ABNORMAL HIGH (ref 0–99)
Triglycerides: 202 mg/dL — ABNORMAL HIGH (ref 0–149)
VLDL Cholesterol Cal: 36 mg/dL (ref 5–40)

## 2023-04-26 LAB — ARTHRITIS PANEL
Anti Nuclear Antibody (ANA): NEGATIVE
Rheumatoid fact SerPl-aCnc: 10.9 IU/mL (ref <14.0)
Sed Rate: 15 mm/hr (ref 0–40)
Uric Acid: 5.2 mg/dL (ref 3.0–7.2)

## 2023-04-26 LAB — CMP14+EGFR
ALT: 8 IU/L (ref 0–32)
AST: 21 IU/L (ref 0–40)
Albumin/Globulin Ratio: 1.5 (ref 1.2–2.2)
Albumin: 4.2 g/dL (ref 3.9–4.9)
Alkaline Phosphatase: 62 IU/L (ref 44–121)
BUN/Creatinine Ratio: 28 (ref 12–28)
BUN: 26 mg/dL (ref 8–27)
Bilirubin Total: <0.2 mg/dL (ref 0.0–1.2)
CO2: 28 mmol/L (ref 20–29)
Calcium: 9.9 mg/dL (ref 8.7–10.3)
Chloride: 100 mmol/L (ref 96–106)
Creatinine, Ser: 0.93 mg/dL (ref 0.57–1.00)
Globulin, Total: 2.8 g/dL (ref 1.5–4.5)
Glucose: 78 mg/dL (ref 70–99)
Potassium: 4.8 mmol/L (ref 3.5–5.2)
Sodium: 142 mmol/L (ref 134–144)
Total Protein: 7 g/dL (ref 6.0–8.5)
eGFR: 67 mL/min/{1.73_m2} (ref >59)

## 2023-04-26 LAB — VITAMIN D 25 HYDROXY (VIT D DEFICIENCY, FRACTURES): Vit D, 25-Hydroxy: 32.9 ng/mL (ref 30.0–100.0)

## 2023-05-03 ENCOUNTER — Telehealth: Payer: Self-pay

## 2023-05-06 ENCOUNTER — Encounter: Payer: Self-pay | Admitting: Internal Medicine

## 2023-05-06 ENCOUNTER — Ambulatory Visit: Payer: No Typology Code available for payment source | Admitting: Nurse Practitioner

## 2023-05-06 ENCOUNTER — Telehealth: Payer: Self-pay | Admitting: Internal Medicine

## 2023-05-06 ENCOUNTER — Ambulatory Visit (INDEPENDENT_AMBULATORY_CARE_PROVIDER_SITE_OTHER): Payer: No Typology Code available for payment source | Admitting: Internal Medicine

## 2023-05-06 VITALS — BP 122/70 | HR 64 | Ht 60.0 in | Wt 103.6 lb

## 2023-05-06 DIAGNOSIS — F01B4 Vascular dementia, moderate, with anxiety: Secondary | ICD-10-CM

## 2023-05-06 DIAGNOSIS — I10 Essential (primary) hypertension: Secondary | ICD-10-CM | POA: Diagnosis not present

## 2023-05-06 DIAGNOSIS — F411 Generalized anxiety disorder: Secondary | ICD-10-CM | POA: Diagnosis not present

## 2023-05-06 DIAGNOSIS — G500 Trigeminal neuralgia: Secondary | ICD-10-CM

## 2023-05-06 DIAGNOSIS — E782 Mixed hyperlipidemia: Secondary | ICD-10-CM | POA: Diagnosis not present

## 2023-05-06 DIAGNOSIS — E041 Nontoxic single thyroid nodule: Secondary | ICD-10-CM | POA: Diagnosis not present

## 2023-05-06 MED ORDER — PRAVASTATIN SODIUM 40 MG PO TABS
40.0000 mg | ORAL_TABLET | Freq: Every day | ORAL | 3 refills | Status: AC
Start: 2023-05-06 — End: ?

## 2023-05-06 MED ORDER — HYDROCHLOROTHIAZIDE 12.5 MG PO TABS: 12.5000 mg | ORAL_TABLET | Freq: Every day | ORAL | 3 refills | Status: DC

## 2023-05-06 NOTE — Telephone Encounter (Signed)
Entered in error

## 2023-05-06 NOTE — Telephone Encounter (Signed)
Tiffany with [facility] called to inform us that the son does not wanting them coming today so they are coming to initiate care tomorrow, just FYI.

## 2023-05-06 NOTE — Progress Notes (Signed)
Established Patient Office Visit  Subjective:  Patient ID: Amanda Hardin, female    DOB: Feb 04, 1956  Age: 67 y.o. MRN: [id number]  Chief Complaint  Patient presents with   Follow-up    10 day follow up    Patient comes in with her friend for her follow-up today.  She says she is generally feeling well and has no new complaints.  At her last visit her medications were renewed so she is now taking Aricept as well as Cymbalta 30 mg/day.  Her labs were done which showed an elevated LDL.  Apparently patient is not taking her statin, will refill it today.  Patient is still forgetful about her meals and has lost more weight.  Home health services referral has been sent and they are supposed to come to her home today for set up.  Patient's condition discussed with her son over the phone.  Hopefully she will start eating better and will be able to stay at her own home.  Discussed utilizing Ensure cans 1 to 2/day.  Her TSH is also slightly suppressed.  Will schedule thyroid ultrasound and repeat blood work at a later date.    No other concerns at this time.   Past Medical History:  Diagnosis Date   Anxiety    Arthritis    Complication of anesthesia    Difficulty waking up.   Cough    Depression    GERD (gastroesophageal reflux disease)    Hyperlipidemia    Hypertension    Thyroid nodule    Torn meniscus    R     Past Surgical History:  Procedure Laterality Date   BREAST BIOPSY Left 09/2015   [place] core bx  NEG    BREAST BIOPSY Right    both core and excisional done, multiples for fibroadenomas   BREAST BIOPSY Right 10/26/2020   stereo [facility] clip, [facility]   BREAST EXCISIONAL BIOPSY Left 2014   fibroadenomas   BREAST LUMPECTOMY Right 12/07/2020   [facility]   BREAST LUMPECTOMY WITH NEEDLE LOCALIZATION AND AXILLARY SENTINEL LYMPH NODE BX Right 12/07/2020   Procedure: BREAST LUMPECTOMY WITH NEEDLE LOCALIZATION AND AXILLARY SENTINEL LYMPH NODE BX;  Surgeon: Earline Mayotte, MD;  Location:  [facility] ORS;  Service: General;  Laterality: Right;   CESAREAN SECTION     CYSTOSCOPY N/A 07/09/2018   Procedure: CYSTOSCOPY;  Surgeon: Conard Novak, MD;  Location: [facility] ORS;  Service: Gynecology;  Laterality: N/A;   ROBOTIC ASSISTED LAPAROSCOPIC OVARIAN CYSTECTOMY Bilateral 07/09/2018   Procedure: ROBOTIC ASSISTED LAPAROSCOPIC OVARIAN CYSTECTOMY;  Surgeon: Conard Novak, MD;  Location: [facility] ORS;  Service: Gynecology;  Laterality: Bilateral;   ROBOTIC ASSISTED TOTAL HYSTERECTOMY WITH BILATERAL SALPINGO OOPHERECTOMY Bilateral 07/09/2018   Procedure: ROBOTIC ASSISTED TOTAL HYSTERECTOMY WITH BILATERAL SALPINGO OOPHORECTOMY;  Surgeon: Conard Novak, MD;  Location: [facility] ORS;  Service: Gynecology;  Laterality: Bilateral;   TONSILLECTOMY      Social History   Socioeconomic History   Marital status: Single    Spouse name: Not on file   Number of children: Not on file   Years of education: Not on file   Highest education level: Not on file  Occupational History   Not on file  Tobacco Use   Smoking status: Former    Years: 25    Types: Cigarettes    Quit date: 03/19/2002    Years since quitting: 21.1   Smokeless tobacco: Never  Vaping Use   Vaping Use: Never used  Substance and Sexual  Activity   Alcohol use: No   Drug use: No   Sexual activity: Not Currently    Birth control/protection: Post-menopausal  Other Topics Concern   Not on file  Social History Narrative   Not on file   Social Determinants of Health   Financial Resource Strain: Not on file  Food Insecurity: Not on file  Transportation Needs: Not on file  Physical Activity: Unknown (06/10/2018)   Exercise Vital Sign    Days of Exercise per Week: 7 days    Minutes of Exercise per Session: Not on file  Stress: Not on file  Social Connections: Not on file  Intimate Partner Violence: Not on file    Family History  Problem Relation Age of Onset   Diabetes Maternal Aunt    Hypertension Maternal Aunt     Prostate cancer Father    Heart failure Father    Pancreatic cancer Paternal Aunt    Breast cancer Neg Hx    Bladder Cancer Neg Hx    Kidney cancer Neg Hx     Allergies  Allergen Reactions   Norco [Hydrocodone-Acetaminophen] Nausea And Vomiting   Penicillin G     Childhood allergy Has patient had a PCN reaction causing immediate rash, facial/tongue/throat swelling, SOB or lightheadedness with hypotension: Unknown Has patient had a PCN reaction causing severe rash involving mucus membranes or skin necrosis: Unknown Has patient had a PCN reaction that required hospitalization: Unknown Has patient had a PCN reaction occurring within the last 10 years: No If all of the above answers are "NO", then may proceed with Cephalosporin use.    Tape Rash    Review of Systems  Constitutional:  Positive for weight loss. Negative for chills, diaphoresis, fever and malaise/fatigue.  HENT:  Negative for congestion, ear discharge, ear pain, hearing loss, nosebleeds, sinus pain, sore throat and tinnitus.   Eyes: Negative.   Respiratory:  Negative for cough, shortness of breath and wheezing.   Cardiovascular:  Negative for chest pain, palpitations, leg swelling and PND.  Gastrointestinal:  Negative for abdominal pain, blood in stool, constipation, heartburn, nausea and vomiting.  Genitourinary:  Negative for dysuria, flank pain and hematuria.  Musculoskeletal:  Negative for back pain, falls, joint pain, myalgias and neck pain.  Skin: Negative.   Neurological:  Negative for dizziness, tingling, seizures, loss of consciousness, weakness and headaches.  Psychiatric/Behavioral:  Negative for depression, substance abuse and suicidal ideas. The patient is not nervous/anxious and does not have insomnia.        Objective:   BP 122/70   Pulse 64   Ht 5' (1.524 m)   Wt 103 lb 9.6 oz (47 kg)   SpO2 99%   BMI 20.23 kg/m   Vitals:   05/06/23 1021  BP: 122/70  Pulse: 64  Height: 5' (1.524 m)   Weight: 103 lb 9.6 oz (47 kg)  SpO2: 99%  BMI (Calculated): 20.23    Physical Exam Vitals and nursing note reviewed.  Constitutional:      Appearance: Normal appearance.  Cardiovascular:     Rate and Rhythm: Normal rate and regular rhythm.     Pulses: Normal pulses.     Heart sounds: Normal heart sounds. No murmur heard. Pulmonary:     Effort: Pulmonary effort is normal.     Breath sounds: No wheezing, rhonchi or rales.  Abdominal:     General: Bowel sounds are normal.     Palpations: Abdomen is soft.  Musculoskeletal:  General: No swelling or tenderness. Normal range of motion.     Cervical back: Normal range of motion.     Right lower leg: No edema.     Left lower leg: No edema.  Neurological:     General: No focal deficit present.     Mental Status: She is alert. Mental status is at baseline.     Motor: No weakness.     Coordination: Coordination normal.     Gait: Gait normal.     Deep Tendon Reflexes: Reflexes normal.  Psychiatric:        Mood and Affect: Mood normal.        Behavior: Behavior normal.      No results found for any visits on 05/06/23.  Recent Results (from the past 2160 hour(s))  POCT Urinalysis Dipstick (16109)     Status: Abnormal   Collection Time: 04/25/23  3:22 PM  Result Value Ref Range   Color, UA     Clarity, UA     Glucose, UA Negative Negative   Bilirubin, UA Negative    Ketones, UA Negative    Spec Grav, UA 1.015 1.010 - 1.025   Blood, UA Negative    pH, UA 8.5 (A) 5.0 - 8.0   Protein, UA Negative Negative   Urobilinogen, UA 0.2 0.2 or 1.0 E.U./dL   Nitrite, UA Negative    Leukocytes, UA Negative Negative   Appearance     Odor    CBC With Differential     Status: None   Collection Time: 04/25/23  3:30 PM  Result Value Ref Range   WBC 6.4 3.4 - 10.8 x10E3/uL   RBC 4.60 3.77 - 5.28 x10E6/uL   Hemoglobin 12.5 11.1 - 15.9 g/dL   Hematocrit 60.4 54.0 - 46.6 %   MCV 85 79 - 97 fL   MCH 27.2 26.6 - 33.0 pg   MCHC 32.0  31.5 - 35.7 g/dL   RDW 98.1 19.1 - 47.8 %   Neutrophils 68 Not Estab. %   Lymphs 21 Not Estab. %   Monocytes 9 Not Estab. %   Eos 1 Not Estab. %   Basos 1 Not Estab. %   Neutrophils Absolute 4.4 1.4 - 7.0 x10E3/uL   Lymphocytes Absolute 1.4 0.7 - 3.1 x10E3/uL   Monocytes Absolute 0.5 0.1 - 0.9 x10E3/uL   EOS (ABSOLUTE) 0.1 0.0 - 0.4 x10E3/uL   Basophils Absolute 0.0 0.0 - 0.2 x10E3/uL   Immature Granulocytes 0 Not Estab. %   Immature Grans (Abs) 0.0 0.0 - 0.1 x10E3/uL  CMP14+EGFR     Status: None   Collection Time: 04/25/23  3:30 PM  Result Value Ref Range   Glucose 78 70 - 99 mg/dL   BUN 26 8 - 27 mg/dL   Creatinine, Ser 2.95 0.57 - 1.00 mg/dL   eGFR 67 >62 ZH/YQM/5.78   BUN/Creatinine Ratio 28 12 - 28   Sodium 142 134 - 144 mmol/L   Potassium 4.8 3.5 - 5.2 mmol/L   Chloride 100 96 - 106 mmol/L   CO2 28 20 - 29 mmol/L   Calcium 9.9 8.7 - 10.3 mg/dL   Total Protein 7.0 6.0 - 8.5 g/dL   Albumin 4.2 3.9 - 4.9 g/dL   Globulin, Total 2.8 1.5 - 4.5 g/dL   Albumin/Globulin Ratio 1.5 1.2 - 2.2   Bilirubin Total <0.2 0.0 - 1.2 mg/dL   Alkaline Phosphatase 62 44 - 121 IU/L   AST 21 0 - 40 IU/L  ALT 8 0 - 32 IU/L  Lipid Panel w/o Chol/HDL Ratio     Status: Abnormal   Collection Time: 04/25/23  3:30 PM  Result Value Ref Range   Cholesterol, Total 232 (H) 100 - 199 mg/dL   Triglycerides 161 (H) 0 - 149 mg/dL   HDL 64 >09 mg/dL   VLDL Cholesterol Cal 36 5 - 40 mg/dL   LDL Chol Calc (NIH) 604 (H) 0 - 99 mg/dL  [id number]     Status: Abnormal   Collection Time: 04/25/23  3:30 PM  Result Value Ref Range   TSH 0.443 (L) 0.450 - 4.500 uIU/mL   T3, Free 2.6 2.0 - 4.4 pg/mL   Free T4 1.06 0.82 - 1.77 ng/dL  Arthritis Panel     Status: None   Collection Time: 04/25/23  3:30 PM  Result Value Ref Range   Uric Acid 5.2 3.0 - 7.2 mg/dL    Comment:            Therapeutic target for gout patients: <6.0   Anti Nuclear Antibody (ANA) Negative Negative   Rheumatoid fact SerPl-aCnc 10.9  <14.0 IU/mL   Sed Rate 15 0 - 40 mm/hr  Vitamin B12     Status: None   Collection Time: 04/25/23  3:30 PM  Result Value Ref Range   Vitamin B-12 691 232 - 1,245 pg/mL  Vitamin D (25 hydroxy)     Status: None   Collection Time: 04/25/23  3:30 PM  Result Value Ref Range   Vit D, 25-Hydroxy 32.9 30.0 - 100.0 ng/mL    Comment: Vitamin D deficiency has been defined by the Institute of Medicine and an Endocrine Society practice guideline as a level of serum 25-OH vitamin D less than 20 ng/mL (1,2). The Endocrine Society went on to further define vitamin D insufficiency as a level between 21 and 29 ng/mL (2). 1. IOM (Institute of Medicine). 2010. Dietary reference    intakes for calcium and D. [place]: The    [employer]. 2. Holick MF, Binkley [place], Bischoff-Ferrari HA, et al.    Evaluation, treatment, and prevention of vitamin D    deficiency: an Endocrine Society clinical practice    guideline. JCEM. 2011 Jul; 96(7):1911-30.       Assessment & Plan:  Patient advised to continue taking her meds.  Pravastatin also refilled.  Will get a thyroid ultrasound.  Repeat thyroid labs at follow-up.  Patient encouraged to drink Ensure cans along with her regular meals. Problem List Items Addressed This Visit     Trigeminal neuralgia   Moderate vascular dementia with anxiety (HCC)   GAD (generalized anxiety disorder)   Mixed hyperlipidemia - Primary   Relevant Medications   pravastatin (PRAVACHOL) 40 MG tablet   hydrochlorothiazide (HYDRODIURIL) 12.5 MG tablet   Essential hypertension, benign   Relevant Medications   pravastatin (PRAVACHOL) 40 MG tablet   hydrochlorothiazide (HYDRODIURIL) 12.5 MG tablet   Other Visit Diagnoses     Thyroid nodule       Relevant Orders   US THYROID       Return in about 4 weeks (around 06/03/2023).   Total time spent: 30 minutes  Margaretann Loveless, MD  05/06/2023   This document may have been prepared by [facility] Voice Recognition  software and as such may include unintentional dictation errors.

## 2023-05-08 ENCOUNTER — Ambulatory Visit (INDEPENDENT_AMBULATORY_CARE_PROVIDER_SITE_OTHER): Payer: No Typology Code available for payment source

## 2023-05-08 DIAGNOSIS — E041 Nontoxic single thyroid nodule: Secondary | ICD-10-CM | POA: Diagnosis not present

## 2023-05-09 ENCOUNTER — Telehealth: Payer: Self-pay | Admitting: Internal Medicine

## 2023-05-09 NOTE — Telephone Encounter (Signed)
Schol with [facility] called to let us know that the patient declined home health aide services. She is also requesting verbal orders for skilled nursing for once a week x 9 weeks and verbal orders for speech therapy. Please advise.  Callback # [phone]

## 2023-05-15 ENCOUNTER — Telehealth: Payer: Self-pay | Admitting: Internal Medicine

## 2023-05-15 NOTE — Telephone Encounter (Signed)
Fawn Kirk from [employer] called and said that she (speech therapist) and nurses feel that the patient is unsafe to be driving. She has dementia and is still driving. Lynden Ang called because the son of the patient wanted her to tell us.  Lynden Ang [phone]

## 2023-05-27 ENCOUNTER — Telehealth: Payer: Self-pay

## 2023-05-27 NOTE — Telephone Encounter (Signed)
Cathy a therapist with [facility] called and left vm regarding pt, requesting verbal orders for a social work eval to discuss with pt's son about long-term planning, severe memory deficits, & dementia since pt lives alone.   She also wanted to report pt possibly overdosed on meds today, somehow took her AM & PM pills at the same time. She said pt's vitals are good, pt feels fine just a little sleepy but just wanted to let you know

## 2023-06-03 ENCOUNTER — Ambulatory Visit: Payer: No Typology Code available for payment source | Admitting: Internal Medicine

## 2023-06-04 ENCOUNTER — Encounter: Payer: Self-pay | Admitting: Internal Medicine

## 2023-06-04 ENCOUNTER — Ambulatory Visit (INDEPENDENT_AMBULATORY_CARE_PROVIDER_SITE_OTHER): Payer: No Typology Code available for payment source | Admitting: Internal Medicine

## 2023-06-04 VITALS — BP 132/58 | HR 73 | Ht 60.0 in | Wt 106.6 lb

## 2023-06-04 DIAGNOSIS — E782 Mixed hyperlipidemia: Secondary | ICD-10-CM | POA: Diagnosis not present

## 2023-06-04 DIAGNOSIS — R634 Abnormal weight loss: Secondary | ICD-10-CM | POA: Diagnosis not present

## 2023-06-04 DIAGNOSIS — F01B4 Vascular dementia, moderate, with anxiety: Secondary | ICD-10-CM | POA: Diagnosis not present

## 2023-06-04 DIAGNOSIS — I10 Essential (primary) hypertension: Secondary | ICD-10-CM | POA: Diagnosis not present

## 2023-06-04 NOTE — Progress Notes (Signed)
Established Patient Office Visit  Subjective:  Patient ID: Amanda Hardin, female    DOB: 06/14/1956  Age: 67 y.o. MRN: [id number]  Chief Complaint  Patient presents with   Follow-up    4 week follow up    Patient in office for 4 week follow up, accompanied by a friend/neighbor. According to her patients's short term memory is declining rapidly. Yesterday she took most of the pills from her weekly pill box- EMT was called and vitals were stable. Today again extra pills were missing. Patient gets a skilled nurse once a week - who fixes her pill box. Social service consult was recommended - to consider long term placement. Patient's son Amanda Hardin ws on the phone again today. Will be coming to [place] to look at different facilities for placement. Meanwhile her neighbors are trying to help out with meals and safety issues.    No other concerns at this time.   Past Medical History:  Diagnosis Date   Anxiety    Arthritis    Complication of anesthesia    Difficulty waking up.   Cough    Depression    GERD (gastroesophageal reflux disease)    Hyperlipidemia    Hypertension    Thyroid nodule    Torn meniscus    R     Past Surgical History:  Procedure Laterality Date   BREAST BIOPSY Left 09/2015   [place] core bx  NEG    BREAST BIOPSY Right    both core and excisional done, multiples for fibroadenomas   BREAST BIOPSY Right 10/26/2020   ste[facility] clip, [facility]   BREAST EXCISIONAL BIOPSY Left 2014   fibroadenomas   BREAST LUMPECTOMY Right 12/07/2020   [facility]   BREAST LUMPECTOMY WITH NEEDLE LOCALIZATION AND AXILLARY SENTINEL LYMPH NODE BX Right 12/07/2020   Procedure: BREAST LUMPECTOMY WITH NEEDLE LOCALIZATION AND AXILLARY SENTINEL LYMPH NODE BX;  Surgeon: Earline Mayotte, MD;  Location: [facility] OR[facility];  Service: General;  Laterality: Right;   CESAREAN SECTION     CYSTOSCOPY N/A 07/09/2018   Procedure: CYSTOSCOPY;  Surgeon: Conard Novak, MD;  Location: [facility] OR[facility];  Service:  Gynecology;  Laterality: N/A;   ROBOTIC ASSISTED LAPAROSCOPIC OVARIAN CYSTECTOMY Bilateral 07/09/2018   Procedure: ROBOTIC ASSISTED LAPAROSCOPIC OVARIAN CYSTECTOMY;  Surgeon: Conard Novak, MD;  Location: [facility] OR[facility];  Service: Gynecology;  Laterality: Bilateral;   ROBOTIC ASSISTED TOTAL HYSTERECTOMY WITH BILATERAL SALPINGO OOPHERECTOMY Bilateral 07/09/2018   Procedure: ROBOTIC ASSISTED TOTAL HYSTERECTOMY WITH BILATERAL SALPINGO OOPHORECTOMY;  Surgeon: Conard Novak, MD;  Location: [facility] OR[facility];  Service: Gynecology;  Laterality: Bilateral;   TONSILLECTOMY      Social History   Socioeconomic History   Marital status: Single    Spouse name: Not on file   Number of children: Not on file   Years of education: Not on file   Highest education level: Not on file  Occupational History   Not on file  Tobacco Use   Smoking status: Former    Years: 25    Types: Cigarettes    Quit date: 03/19/2002    Years since quitting: 21.2   Smokeless tobacco: Never  Vaping Use   Vaping Use: Never used  Substance and Sexual Activity   Alcohol use: No   Drug use: No   Sexual activity: Not Currently    Birth control/protection: Post-menopausal  Other Topics Concern   Not on file  Social History Narrative   Not on file   Social Determinants of Health  Financial Resource Strain: Not on file  Food Insecurity: Not on file  Transportation Needs: Not on file  Physical Activity: Unknown (06/10/2018)   Exercise Vital Sign    Days of Exercise per Week: 7 days    Minutes of Exercise per Session: Not on file  Stress: Not on file  Social Connections: Not on file  Intimate Partner Violence: Not on file    Family History  Problem Relation Age of Onset   Diabetes Maternal Aunt    Hypertension Maternal Aunt    Prostate cancer Father    Heart failure Father    Pancreatic cancer Paternal Aunt    Breast cancer Neg Hx    Bladder Cancer Neg Hx    Kidney cancer Neg Hx     Allergies  Allergen Reactions    Norco [Hydrocodone-Acetaminophen] Nausea And Vomiting   Penicillin G     Childhood allergy Has patient had a PCN reaction causing immediate rash, facial/tongue/throat swelling, SOB or lightheadedness with hypotension: Unknown Has patient had a PCN reaction causing severe rash involving mucus membranes or skin necrosis: Unknown Has patient had a PCN reaction that required hospitalization: Unknown Has patient had a PCN reaction occurring within the last 10 years: No If all of the above answers are "NO", then may proceed with Cephalosporin use.    Tape Rash    Review of Systems  Constitutional:  Positive for weight loss. Negative for chills, fever and malaise/fatigue.  HENT: Negative.    Eyes: Negative.   Respiratory: Negative.  Negative for shortness of breath.   Cardiovascular:  Negative for chest pain and palpitations.  Gastrointestinal: Negative.  Negative for heartburn, nausea and vomiting.  Genitourinary: Negative.   Musculoskeletal:  Positive for joint pain.  Skin: Negative.   Neurological: Negative.   Endo/Heme/Allergies: Negative.   Psychiatric/Behavioral:  Positive for memory loss.        Objective:   BP (!) 132/58   Pulse 73   Ht 5' (1.524 m)   Wt 106 lb 9.6 oz (48.4 kg)   SpO2 98%   BMI 20.82 kg/m   Vitals:   06/04/23 1112  BP: (!) 132/58  Pulse: 73  Height: 5' (1.524 m)  Weight: 106 lb 9.6 oz (48.4 kg)  SpO2: 98%  BMI (Calculated): 20.82    Physical Exam Vitals and nursing note reviewed.  Constitutional:      Appearance: Normal appearance.  HENT:     Head: Normocephalic and atraumatic.     Nose: Nose normal.  Cardiovascular:     Rate and Rhythm: Normal rate and regular rhythm.     Heart sounds: Normal heart sounds.  Pulmonary:     Effort: Pulmonary effort is normal.     Breath sounds: Normal breath sounds.  Abdominal:     General: Abdomen is flat.  Musculoskeletal:        General: Normal range of motion.     Cervical back: Normal range of  motion.  Skin:    General: Skin is warm and dry.  Neurological:     Mental Status: She is alert.      No results found for any visits on 06/04/23.  Recent Results (from the past 2160 hour(s))  POCT Urinalysis Dipstick (78295)     Status: Abnormal   Collection Time: 04/25/23  3:22 PM  Result Value Ref Range   Color, UA     Clarity, UA     Glucose, UA Negative Negative   Bilirubin, UA Negative  Ketones, UA Negative    Spec Grav, UA 1.015 1.010 - 1.025   Blood, UA Negative    pH, UA 8.5 (A) 5.0 - 8.0   Protein, UA Negative Negative   Urobilinogen, UA 0.2 0.2 or 1.0 E.U./dL   Nitrite, UA Negative    Leukocytes, UA Negative Negative   Appearance     Odor    CBC With Differential     Status: None   Collection Time: 04/25/23  3:30 PM  Result Value Ref Range   WBC 6.4 3.4 - 10.8 x10E3/uL   RBC 4.60 3.77 - 5.28 x10E6/uL   Hemoglobin 12.5 11.1 - 15.9 g/dL   Hematocrit 16.1 09.6 - 46.6 %   MCV 85 79 - 97 fL   MCH 27.2 26.6 - 33.0 pg   MCHC 32.0 31.5 - 35.7 g/dL   RDW 04.5 40.9 - 81.1 %   Neutrophils 68 Not Estab. %   Lymphs 21 Not Estab. %   Monocytes 9 Not Estab. %   Eos 1 Not Estab. %   Basos 1 Not Estab. %   Neutrophils Absolute 4.4 1.4 - 7.0 x10E3/uL   Lymphocytes Absolute 1.4 0.7 - 3.1 x10E3/uL   Monocytes Absolute 0.5 0.1 - 0.9 x10E3/uL   EOS (ABSOLUTE) 0.1 0.0 - 0.4 x10E3/uL   Basophils Absolute 0.0 0.0 - 0.2 x10E3/uL   Immature Granulocytes 0 Not Estab. %   Immature Grans (Abs) 0.0 0.0 - 0.1 x10E3/uL  CMP14+EGFR     Status: None   Collection Time: 04/25/23  3:30 PM  Result Value Ref Range   Glucose 78 70 - 99 mg/dL   BUN 26 8 - 27 mg/dL   Creatinine, Ser 9.14 0.57 - 1.00 mg/dL   eGFR 67 >78 GN/FAO/1.30   BUN/Creatinine Ratio 28 12 - 28   Sodium 142 134 - 144 mmol/L   Potassium 4.8 3.5 - 5.2 mmol/L   Chloride 100 96 - 106 mmol/L   CO2 28 20 - 29 mmol/L   Calcium 9.9 8.7 - 10.3 mg/dL   Total Protein 7.0 6.0 - 8.5 g/dL   Albumin 4.2 3.9 - 4.9 g/dL    Globulin, Total 2.8 1.5 - 4.5 g/dL   Albumin/Globulin Ratio 1.5 1.2 - 2.2   Bilirubin Total <0.2 0.0 - 1.2 mg/dL   Alkaline Phosphatase 62 44 - 121 IU/L   AST 21 0 - 40 IU/L   ALT 8 0 - 32 IU/L  Lipid Panel w/o Chol/HDL Ratio     Status: Abnormal   Collection Time: 04/25/23  3:30 PM  Result Value Ref Range   Cholesterol, Total 232 (H) 100 - 199 mg/dL   Triglycerides 865 (H) 0 - 149 mg/dL   HDL 64 >78 mg/dL   VLDL Cholesterol Cal 36 5 - 40 mg/dL   LDL Chol Calc (NIH) 469 (H) 0 - 99 mg/dL  GEX+B2W+[id number]     Status: Abnormal   Collection Time: 04/25/23  3:30 PM  Result Value Ref Range   TSH 0.443 (L) 0.450 - 4.500 uIU/mL   T3, Free 2.6 2.0 - 4.4 pg/mL   Free T4 1.06 0.82 - 1.77 ng/dL  Arthritis Panel     Status: None   Collection Time: 04/25/23  3:30 PM  Result Value Ref Range   Uric Acid 5.2 3.0 - 7.2 mg/dL    Comment:            Therapeutic target for gout patients: <6.0   Anti Nuclear Antibody (ANA) Negative Negative  Rheumatoid fact SerPl-aCnc 10.9 <14.0 IU/mL   Sed Rate 15 0 - 40 mm/hr  Vitamin B12     Status: None   Collection Time: 04/25/23  3:30 PM  Result Value Ref Range   Vitamin B-12 691 232 - 1,245 pg/mL  Vitamin D (25 hydroxy)     Status: None   Collection Time: 04/25/23  3:30 PM  Result Value Ref Range   Vit D, 25-Hydroxy 32.9 30.0 - 100.0 ng/mL    Comment: Vitamin D deficiency has been defined by the Institute of Medicine and an Endocrine Society practice guideline as a level of serum 25-OH vitamin D less than 20 ng/mL (1,2). The Endocrine Society went on to further define vitamin D insufficiency as a level between 21 and 29 ng/mL (2). 1. IOM (Institute of Medicine). 2010. Dietary reference    intakes for calcium and D. [place]: The    [employer]. 2. Holick MF, Binkley [place], Bischoff-Ferrari HA, et al.    Evaluation, treatment, and prevention of vitamin D    deficiency: an Endocrine Society clinical practice    guideline. JCEM. 2011 Jul;  96(7):1911-30.       Assessment & Plan:  Patient will return sooner if needed. Meanwhile needs close monitoring. Problem List Items Addressed This Visit     Moderate vascular dementia with anxiety (HCC)   Weight loss   Mixed hyperlipidemia - Primary   Essential hypertension, benign    Return in about 2 months (around 08/04/2023).   Total time spent: 30 minutes  Margaretann Loveless, MD  06/04/2023   This document may have been prepared by [facility] Voice Recognition software and as such may include unintentional dictation errors.

## 2023-06-23 ENCOUNTER — Other Ambulatory Visit: Payer: Self-pay | Admitting: Oncology

## 2023-06-24 ENCOUNTER — Emergency Department (HOSPITAL_COMMUNITY)
Admission: EM | Admit: 2023-06-24 | Discharge: 2023-06-28 | Disposition: A | Discharge status: Home / Self Care | Payer: No Typology Code available for payment source | Attending: Emergency Medicine | Admitting: Emergency Medicine

## 2023-06-24 ENCOUNTER — Other Ambulatory Visit: Payer: Self-pay

## 2023-06-24 ENCOUNTER — Encounter (HOSPITAL_COMMUNITY): Payer: Self-pay | Admitting: Emergency Medicine

## 2023-06-24 DIAGNOSIS — E876 Hypokalemia: Secondary | ICD-10-CM | POA: Diagnosis not present

## 2023-06-24 DIAGNOSIS — Z79899 Other long term (current) drug therapy: Secondary | ICD-10-CM | POA: Insufficient documentation

## 2023-06-24 DIAGNOSIS — F411 Generalized anxiety disorder: Secondary | ICD-10-CM | POA: Diagnosis not present

## 2023-06-24 DIAGNOSIS — R456 Violent behavior: Secondary | ICD-10-CM | POA: Diagnosis present

## 2023-06-24 DIAGNOSIS — Z87891 Personal history of nicotine dependence: Secondary | ICD-10-CM | POA: Insufficient documentation

## 2023-06-24 DIAGNOSIS — F01B0 Vascular dementia, moderate, without behavioral disturbance, psychotic disturbance, mood disturbance, and anxiety: Secondary | ICD-10-CM | POA: Insufficient documentation

## 2023-06-24 DIAGNOSIS — F03918 Unspecified dementia, unspecified severity, with other behavioral disturbance: Secondary | ICD-10-CM

## 2023-06-24 DIAGNOSIS — R4689 Other symptoms and signs involving appearance and behavior: Secondary | ICD-10-CM

## 2023-06-24 DIAGNOSIS — I10 Essential (primary) hypertension: Secondary | ICD-10-CM | POA: Diagnosis not present

## 2023-06-24 DIAGNOSIS — E119 Type 2 diabetes mellitus without complications: Secondary | ICD-10-CM | POA: Insufficient documentation

## 2023-06-24 LAB — COMPREHENSIVE METABOLIC PANEL
ALT: 21 U/L (ref 0–44)
AST: 25 U/L (ref 15–41)
Albumin: 4.3 g/dL (ref 3.5–5.0)
Alkaline Phosphatase: 59 U/L (ref 38–126)
Anion gap: 12 (ref 5–15)
BUN: 16 mg/dL (ref 8–23)
CO2: 25 mmol/L (ref 22–32)
Calcium: 9.4 mg/dL (ref 8.9–10.3)
Chloride: 102 mmol/L (ref 98–111)
Creatinine, Ser: 0.79 mg/dL (ref 0.44–1.00)
GFR, Estimated: >60 mL/min (ref >60)
Glucose, Bld: 101 mg/dL — ABNORMAL HIGH (ref 70–99)
Potassium: 3.1 mmol/L — ABNORMAL LOW (ref 3.5–5.1)
Sodium: 139 mmol/L (ref 135–145)
Total Bilirubin: 0.7 mg/dL (ref 0.3–1.2)
Total Protein: 8.4 g/dL — ABNORMAL HIGH (ref 6.5–8.1)

## 2023-06-24 LAB — RAPID URINE DRUG SCREEN, HOSP PERFORMED
Amphetamines: NOT DETECTED
Barbiturates: NOT DETECTED
Benzodiazepines: NOT DETECTED
Cocaine: NOT DETECTED
Opiates: NOT DETECTED
Tetrahydrocannabinol: NOT DETECTED

## 2023-06-24 LAB — CBC
HCT: 40.1 % (ref 36.0–46.0)
Hemoglobin: 12.5 g/dL (ref 12.0–15.0)
MCH: 27.5 pg (ref 26.0–34.0)
MCHC: 31.2 g/dL (ref 30.0–36.0)
MCV: 88.1 fL (ref 80.0–100.0)
Platelets: 236 10*3/uL (ref 150–400)
RBC: 4.55 MIL/uL (ref 3.87–5.11)
RDW: 14.6 % (ref 11.5–15.5)
WBC: 5.7 10*3/uL (ref 4.0–10.5)
nRBC: 0 % (ref 0.0–0.2)

## 2023-06-24 LAB — ACETAMINOPHEN LEVEL: Acetaminophen (Tylenol), Serum: <10 ug/mL — ABNORMAL LOW (ref 10–30)

## 2023-06-24 LAB — SALICYLATE LEVEL: Salicylate Lvl: <7 mg/dL — ABNORMAL LOW (ref 7.0–30.0)

## 2023-06-24 LAB — ETHANOL: Alcohol, Ethyl (B): <10 mg/dL (ref <10)

## 2023-06-24 MED ORDER — POTASSIUM CHLORIDE CRYS ER 20 MEQ PO TBCR
40.0000 meq | EXTENDED_RELEASE_TABLET | Freq: Once | ORAL | Status: AC
  Administered 2023-06-24: 40 meq via ORAL
  Filled 2023-06-24: qty 2

## 2023-06-24 MED ORDER — HYDRALAZINE HCL 25 MG PO TABS
25.0000 mg | ORAL_TABLET | Freq: Once | ORAL | Status: AC
  Administered 2023-06-24: 25 mg via ORAL
  Filled 2023-06-24: qty 1

## 2023-06-24 MED ORDER — HYDROCHLOROTHIAZIDE 12.5 MG PO TABS
12.5000 mg | ORAL_TABLET | Freq: Every day | ORAL | Status: DC
  Administered 2023-06-24 – 2023-06-28 (×5): 12.5 mg via ORAL
  Filled 2023-06-24 (×5): qty 1

## 2023-06-24 NOTE — ED Notes (Signed)
Pt changed into burgundy scrubs. Personal items placed into labeled pt belongings bag- including shirt, shorts, bra, shoes, purse, phone, jewelry (earrings, bracelet). Bag placed into triage cabinet. Maggie RN advised. [employer]

## 2023-06-24 NOTE — ED Notes (Signed)
One pt belongings bag placed in cabinet above 5-8 nursing assignment

## 2023-06-24 NOTE — ED Provider Notes (Signed)
[facility] AT [facility] Provider Note   CSN: [id number] Arrival date & time: 06/24/23  1627     History  Chief Complaint  Patient presents with   IVC    Amanda Hardin is a 67 y.o. female.  With a history of anxiety, depression, dementia, hypertension, hyperlipidemia, arthritis, GERD who presents to the ED under IVC with police for evaluation of altered behavior.  Patient states she feels at her baseline and does not feel like she should be here.  She denies any physical complaints.  She denies SI, HI, AVH.  She reports compliance with her home medications.  I spoke with patient's son, Kyung Bacca.  He states that the patient has been acting aggressive over the past couple of weeks.  States that she has a short temper and has been striking out and hitting things.  States the neurologist has told her to stop driving, however she still drives very often and will get mad when he tells her not to.  He states that she has not been taking her medications as prescribed lately and misses many doses.  He states she will occasionally hit her head on walls and punch objects when she is frustrated.  HPI     Home Medications Prior to Admission medications   Medication Sig Start Date End Date Taking? Authorizing Provider  diclofenac (VOLTAREN) 75 MG EC tablet Take 75 mg by mouth 2 (two) times daily.    [provider]  donepezil (ARICEPT) 10 MG tablet Take 1 tablet (10 mg total) by mouth at bedtime. 04/25/23 04/24/24  Margaretann Loveless, MD  DULoxetine (CYMBALTA) 30 MG capsule Take 1 capsule (30 mg total) by mouth daily. Take with 60mg  tab for a total of 90 mg 04/25/23   Margaretann Loveless, MD  enalapril (VASOTEC) 10 MG tablet Take 10 mg by mouth daily.     [provider]  gabapentin (NEURONTIN) 300 MG capsule Take 1 capsule by mouth at bedtime. 12/27/22   [provider]  hydrochlorothiazide (HYDRODIURIL) 12.5 MG tablet Take 1 tablet (12.5 mg total)  by mouth daily. 05/06/23   Margaretann Loveless, MD  letrozole [facility]) 2.5 MG tablet TAKE 1 TABLET(2.5 MG) BY MOUTH DAILY 06/24/23   Jeralyn Ruths, MD  meclizine (ANTIVERT) 25 MG tablet Take 25 mg by mouth daily as needed for dizziness.  04/19/16   [provider]  pravastatin (PRAVACHOL) 40 MG tablet Take 1 tablet (40 mg total) by mouth at bedtime. 05/06/23   Margaretann Loveless, MD  pregabalin (LYRICA) 150 MG capsule Take 450 mg by mouth 2 (two) times daily.    [provider]      Allergies    Norco [hydrocodone-acetaminophen], Penicillin g, and Tape    Review of Systems   Review of Systems  Psychiatric/Behavioral:  Positive for agitation, behavioral problems, confusion and self-injury.   All other systems reviewed and are negative.   Physical Exam Updated Vital Signs BP (!) 143/53   Pulse 66   Temp 97.9 F (36.6 C) (Oral)   Resp 17   Ht 5' (1.524 m)   Wt 48 kg   SpO2 100%   BMI 20.67 kg/m  Physical Exam Vitals and nursing note reviewed.  Constitutional:      General: She is not in acute distress.    Appearance: Normal appearance. She is normal weight. She is not ill-appearing.     Comments: Resting comfortably in chair  HENT:  Head: Normocephalic and atraumatic.  Pulmonary:     Effort: Pulmonary effort is normal. No respiratory distress.  Abdominal:     General: Abdomen is flat.  Musculoskeletal:        General: Normal range of motion.     Cervical back: Neck supple.  Skin:    General: Skin is warm and dry.  Neurological:     General: No focal deficit present.     Mental Status: She is alert.     Comments: Intermittent confusion and agitation  Psychiatric:        Mood and Affect: Mood normal.        Behavior: Behavior normal.     ED Results / Procedures / Treatments   Labs (all labs ordered are listed, but only abnormal results are displayed) Labs Reviewed  COMPREHENSIVE METABOLIC PANEL - Abnormal; Notable for the following components:       Result Value   Potassium 3.1 (*)    Glucose, Bld 101 (*)    Total Protein 8.4 (*)    All other components within normal limits  SALICYLATE LEVEL - Abnormal; Notable for the following components:   Salicylate Lvl <7.0 (*)    All other components within normal limits  ACETAMINOPHEN LEVEL - Abnormal; Notable for the following components:   Acetaminophen (Tylenol), Serum <10 (*)    All other components within normal limits  ETHANOL  CBC  RAPID URINE DRUG SCREEN, HOSP PERFORMED    EKG None  Radiology No results found.  Procedures Procedures    Medications Ordered in ED Medications  hydrochlorothiazide (HYDRODIURIL) tablet 12.5 mg (12.5 mg Oral Given 06/24/23 1931)  potassium chloride SA (KLOR-CON M) CR tablet 40 mEq (40 mEq Oral Given 06/24/23 1931)  hydrALAZINE (APRESOLINE) tablet 25 mg (25 mg Oral Given 06/24/23 2052)    ED Course/ Medical Decision Making/ A&P                             Medical Decision Making Amount and/or Complexity of Data Reviewed Labs: ordered.  Risk Prescription drug management.  This patient presents to the ED for concern of agitation, aggressive behavior, this involves an extensive number of treatment options, and is a complaint that carries with it a high risk of complications and morbidity.  The differential diagnosis includes psychosis, dementia, medication noncompliance  Co morbidities that complicate the patient evaluation  anxiety, depression, dementia, hypertension, hyperlipidemia, arthritis, GERD  My initial workup includes medical clearance  Additional history obtained from: Nursing notes from this visit. Family son provides a portion of the history  I ordered, reviewed and interpreted labs which include: CBC, CMP, ethanol, acetaminophen, salicylate, UDS.  Hypokalemia 3.1.  Labs otherwise within normal limits  Afebrile, initially hypertensive which improved after treatment in the ED.  Otherwise hemodynamically stable.  67 year old  female presents to the ED under IVC taken out by her son for evaluation of aggressive behavior.  Full HPI as documented above.  Patient appears fairly well on my exam here in the emergency department.  She has no specific complaints, however I did notice her become agitated when asking her son where her car keys were.  Son is concerned about the patient's wellbeing given that she continues to drive despite neurology recommendations, have significant memory loss, is noncompliant with her medications at home, and has portrayed self-harm behaviors at home. At this time patient is medically cleared and stable for TTS consult.   Patient's case  discussed with Dr. Donnald Garre.   Note: Portions of this report may have been transcribed using voice recognition software. Every effort was made to ensure accuracy; however, inadvertent computerized transcription errors may still be present.        Final Clinical Impression(s) / ED Diagnoses Final diagnoses:  Aggressive behavior    Rx / DC Orders ED Discharge Orders     None         Mora Bellman 06/24/23 2152    Arby Barrette, MD 06/27/23 1400

## 2023-06-24 NOTE — ED Triage Notes (Signed)
Pt arrives via [place] after son IVC'd her. Paperwork states that she has dementia but not taking medications, becoming agitated and wandering around the neighborhood. States that when she gets frustrated, she will bang her head on walls and furniture. Pt sitting in recliner at triage and upset about being committed.

## 2023-06-25 ENCOUNTER — Encounter (HOSPITAL_COMMUNITY): Payer: Self-pay | Admitting: Emergency Medicine

## 2023-06-25 ENCOUNTER — Telehealth: Payer: Self-pay | Admitting: Internal Medicine

## 2023-06-25 DIAGNOSIS — F03918 Unspecified dementia, unspecified severity, with other behavioral disturbance: Secondary | ICD-10-CM

## 2023-06-25 MED ORDER — LORAZEPAM 1 MG PO TABS
1.0000 mg | ORAL_TABLET | Freq: Two times a day (BID) | ORAL | Status: DC | PRN
  Administered 2023-06-28: 1 mg via ORAL
  Filled 2023-06-25: qty 1

## 2023-06-25 MED ORDER — DONEPEZIL HCL 5 MG PO TABS
10.0000 mg | ORAL_TABLET | Freq: Every day | ORAL | Status: DC
  Administered 2023-06-25 – 2023-06-28 (×4): 10 mg via ORAL
  Filled 2023-06-25 (×5): qty 2

## 2023-06-25 MED ORDER — OLANZAPINE 5 MG PO TBDP
5.0000 mg | ORAL_TABLET | Freq: Two times a day (BID) | ORAL | Status: DC | PRN
  Administered 2023-06-28 (×2): 5 mg via ORAL
  Filled 2023-06-25 (×2): qty 1

## 2023-06-25 MED ORDER — OLANZAPINE 10 MG IM SOLR: 5.0000 mg | Freq: Two times a day (BID) | INTRAMUSCULAR | Status: DC | PRN

## 2023-06-25 MED ORDER — LORAZEPAM 2 MG/ML IJ SOLN: 1.0000 mg | Freq: Two times a day (BID) | INTRAMUSCULAR | Status: DC | PRN

## 2023-06-25 NOTE — BH Assessment (Signed)
[facility] will complete TTS assessment. [facility] Coordinator is Dahlia Client, [phone]. The assessment provider and time are pending at this time and will be updated by Maralyn Sago in this secure chat.

## 2023-06-25 NOTE — Progress Notes (Signed)
LCSW Progress Note  [id number]   Scherry Broussard  06/25/2023  12:42 PM  Description:   Inpatient Psychiatric Referral  Patient was recommended inpatient per Dahlia Byes, NP. There are no available beds at [facility] and [facility], per [facility] Rona Ravens, RN. Patient was referred to the following out of network facilities:   Destination  Service Provider Address Phone Fax Patient Preferred  [facility]  [address], [place] [address] [phone] --  [address]  [address], [place] [address] [phone] --  [facility]  [address] [place] [address] [phone] --  [facility]  [address] [place] [address] [phone] --  [facility]  [phone] [place] [address] [phone] --  [facility]  [address] [place], [place] [address] [phone] --  [facility]  [address] [place] [address] [phone] --  [facility]  [address] [place] [address] [phone] --  [facility]  [address], [place] [address] [phone] --  [facility]  [address] [place] [address] [phone] --  [facility]  [address] [place] [address] [phone] --  [facility]  [address], [place] [address] [phone] --  [facility]  [address] [place] [address] [phone] --  [facility]  [address], [place] [address] [phone] --  [facility]  [address], [place] [address] [phone] --  [facility]  [address], [place] [address] [phone] --  [facility]  [address], [place] [address] [phone] --  [facility]  [address] [place], [place] [address] [phone] --  [facility]  [address] [place] [address] [phone] --  [facility]  [address] [place] [address] [phone] --    Situation ongoing, CSW to continue following and update chart as more information becomes available.      Cathie Beams, [place]  06/25/2023 12:42 PM

## 2023-06-25 NOTE — Progress Notes (Signed)
12:54 PM - CSW spoke with Jola Babinski, intake staff, at [facility] via phone call. Pt has been denied by [facility] due to diagnosis of dementia. CSW will continue to assist and follow with placement.  Cathie Beams, [place]  06/25/2023 12:59 PM

## 2023-06-25 NOTE — ED Notes (Signed)
Pt belongings were placed into locker 42 in TCU.

## 2023-06-25 NOTE — Progress Notes (Signed)
4:03 PM - CSW spoke with pt's son, Alford Highland [phone] via phone call regarding psych placement options. CSW reported that psych placement has not been found yet, but psych team is actively working to find inpatient treatment for pt. CSW advised pt's son that diagnosis of dementia can be a barrier to inpatient psychiatric treatment. Pt's son inquired about home health, ALF/SNF, and nursing home options. CSW advised pt's son that disposition depends on pt's psychiatric state. CSW notified RN and NP of conversation.  Cathie Beams, [place]  06/25/2023 4:14 PM

## 2023-06-25 NOTE — ED Notes (Signed)
Doing well with being on the TCU side. Was able to make 3 phone calls today, very pleasant, forgetful.

## 2023-06-25 NOTE — Consult Note (Signed)
[facility] Consult Note  Patient Name: Amanda Hardin MRN: [id number] DOB: 19-Jul-1956 DATE OF Consult: 06/25/2023  PRIMARY PSYCHIATRIC DIAGNOSES  1.  Aggressive behavior 2.  Moderate vascular dementia 3.  GAD  RECOMMENDATIONS  Admit to psych for safety and stabilization. Medication recommendations: Give Zyprexa 5mg  and Lorazepam 1mg  PO or IM Q12H PRN for agitation (Zyprexa and Lorazepam should be given 1 hour apart). Please do not exceed 20 mg of olanzapine within a 24-hour period. Please stop all antipsychotic and QTc prolonging medications if patient's QTc is greater than 500 ms. Continue Aricept 10mg  PO QHS Non-Medication Recommendation: Check urinalysis as this can cause aggression in older adults. Communication: Treatment team members (and family members if applicable) who were involved in treatment/care discussions and planning, and with whom we spoke or engaged with via secure text/chat, include the following: Dr. Fredderick Phenix   Thank you for involving [place] in the care of this patient. If you have any additional questions or concerns, please call [phone] and ask for me or the provider on-call.  TELEPSYCHIATRY ATTESTATION & CONSENT  As the provider for this telehealth consult, I attest that I verified the patient's identity using two separate identifiers, introduced myself to the patient, provided my credentials, disclosed my location, and performed this encounter via a HIPAA-compliant, real-time, face-to-face, two-way, interactive audio and video platform and with the full consent and agreement of the patient (or guardian as applicable.)  Patient physical location: [facility]. Telehealth provider physical location: home office in state of [place].  Video start time: 0536 (Central Time) Video end time: 0551 (Central Time)  IDENTIFYING DATA  Amanda Hardin is a 67 y.o. year-old female for whom a psychiatric consultation has been ordered by the primary provider. The patient was  identified using two separate identifiers.  CHIEF COMPLAINT/REASON FOR CONSULT  Aggressive behavior  HISTORY OF PRESENT ILLNESS (HPI)  The patient is a 67 year old female with a history of GAD, dementia, HTN, Type 2 diabetes and other medical issues. Patient presents to the ER after her son filed an IVC with police due to behavioral issues. Per IVC, son reported that patient has been increasingly agitated, punching objects and striking at others. Son reported that patient has not been sleeping and not taking her medication. Patient has also been found by a neighbor wandering around and not knowing where she is. Patient has also been driving and getting lost after she has been told by her neurologist to stop driving.   Patient denies any aggressive behavior and does not recall being brought to the hospital by the police. She is unable to tell me how she came to the ER and who brought her.  The patient admits to having a short temper and being vexed with her son recently. She reports taking hydrochlorothiazide, enalapril, and donepezil (Aricept) but has not taken duloxetine (Cymbalta) for the last couple of years.   The patient lives alone and has a son living in [place]. She is unsure of the day of the week but is aware that she is in a hospital and that the year is 2024. The patient's mood is described as okay, with a mild headache of unknown origin. She reports sleeping well and denies any thoughts of self-harm, harm to others, auditory hallucinations, or visual hallucinations. The patient denies ever been diagnosed with dementia and denies any history of mental health hospitalizations, suicide attempts, or known family history of mental health illness. She has a niece living in [place], [place]. Patient refused  to give this provider consent to speak with her son.  PAST PSYCHIATRIC HISTORY  Patient denies history of inpatient admission and does not have any outpatient psych services. She denies  history of suicide attempt Otherwise as per HPI above.  PAST MEDICAL HISTORY  Past Medical History:  Diagnosis Date   Anxiety    Arthritis    Complication of anesthesia    Difficulty waking up.   Cough    Depression    GERD (gastroesophageal reflux disease)    Hyperlipidemia    Hypertension    Thyroid nodule    Torn meniscus    R      HOME MEDICATIONS  Facility Ordered Medications  Medication   hydrochlorothiazide (HYDRODIURIL) tablet 12.5 mg   [COMPLETED] potassium chloride SA (KLOR-CON M) CR tablet 40 mEq   [COMPLETED] hydrALAZINE (APRESOLINE) tablet 25 mg   PTA Medications  Medication Sig   enalapril (VASOTEC) 10 MG tablet Take 10 mg by mouth in the morning.   meclizine (ANTIVERT) 25 MG tablet Take 25 mg by mouth daily as needed for dizziness.    pregabalin (LYRICA) 150 MG capsule Take 150-300 mg by mouth See admin instructions. Take 150 mg by mouth in the morning and 300 mg at bedtime   gabapentin (NEURONTIN) 300 MG capsule Take 300 mg by mouth at bedtime.   donepezil (ARICEPT) 10 MG tablet Take 1 tablet (10 mg total) by mouth at bedtime.   DULoxetine (CYMBALTA) 30 MG capsule Take 1 capsule (30 mg total) by mouth daily. Take with 60mg  tab for a total of 90 mg (Patient taking differently: Take 30-60 mg by mouth See admin instructions. Take 60 mg by mouth in the morning and 30 mg at bedtime)   diclofenac (VOLTAREN) 75 MG EC tablet Take 75 mg by mouth 2 (two) times daily.   pravastatin (PRAVACHOL) 40 MG tablet Take 1 tablet (40 mg total) by mouth at bedtime.   hydrochlorothiazide (HYDRODIURIL) 12.5 MG tablet Take 1 tablet (12.5 mg total) by mouth daily.   letrozole (FEMARA) 2.5 MG tablet TAKE 1 TABLET(2.5 MG) BY MOUTH DAILY (Patient taking differently: Take 2.5 mg by mouth in the morning.)     ALLERGIES  Allergies  Allergen Reactions   Penicillins Other (See Comments)    Allergic reaction happened during childhood- not recalled   Norco [Hydrocodone-Acetaminophen] Nausea  And Vomiting   Silicone Rash   Tape Rash    SOCIAL & SUBSTANCE USE HISTORY  Social History   Socioeconomic History   Marital status: Single    Spouse name: Not on file   Number of children: Not on file   Years of education: Not on file   Highest education level: Not on file  Occupational History   Not on file  Tobacco Use   Smoking status: Former    Years: 25    Types: Cigarettes    Quit date: 03/19/2002    Years since quitting: 21.2   Smokeless tobacco: Never  Vaping Use   Vaping Use: Never used  Substance and Sexual Activity   Alcohol use: No   Drug use: No   Sexual activity: Not Currently    Birth control/protection: Post-menopausal  Other Topics Concern   Not on file  Social History Narrative   Not on file   Social Determinants of Health   Financial Resource Strain: Not on file  Food Insecurity: Not on file  Transportation Needs: Not on file  Physical Activity: Unknown (06/10/2018)   Exercise Vital Sign  Days of Exercise per Week: 7 days    Minutes of Exercise per Session: Not on file  Stress: Not on file  Social Connections: Not on file   Social History   Tobacco Use  Smoking Status Former   Years: 25   Types: Cigarettes   Quit date: 03/19/2002   Years since quitting: 21.2  Smokeless Tobacco Never   Social History   Substance and Sexual Activity  Alcohol Use No   Social History   Substance and Sexual Activity  Drug Use No    Additional pertinent information .  FAMILY HISTORY  Family History  Problem Relation Age of Onset   Diabetes Maternal Aunt    Hypertension Maternal Aunt    Prostate cancer Father    Heart failure Father    Pancreatic cancer Paternal Aunt    Breast cancer Neg Hx    Bladder Cancer Neg Hx    Kidney cancer Neg Hx    Family Psychiatric History (if known):  unknown  MENTAL STATUS EXAM (MSE)  Presentation  General Appearance: Appropriate for Environment Eye Contact:Good Speech:Clear and Coherent Speech  Volume:Normal Handedness:Ambidextrous  Mood and Affect  Mood:-- ("I feel abit of a headache right now") Affect:Constricted  Thought Process  Thought Processes:Coherent Descriptions of Associations:Intact  Orientation:-- (oriented to self and place)  Thought Content:Illogical  History of Schizophrenia/Schizoaffective disorder:No data recorded Duration of Psychotic Symptoms:No data recorded Hallucinations:Hallucinations: -- (denies)  Ideas of Reference:No data recorded Suicidal Thoughts:Suicidal Thoughts: No  Homicidal Thoughts:Homicidal Thoughts: No   Sensorium  Memory:fair Judgment:-- (limited) Insight:-- (limited)  Executive Functions  Concentration:fair Attention Span:Fair Recall:Fair Fund of Knowledge:Fair Language:Good  Psychomotor Activity  Psychomotor Activity:Psychomotor Activity: Normal  Assets  Assets:No data recorded  Sleep  Sleep:Son reports patient has not been sleeping but patient denies this  VITALS  Blood pressure (!) 154/63, pulse 62, temperature 97.9 F (36.6 C), temperature source Oral, resp. rate 16, height 5' (1.524 m), weight 48 kg, SpO2 96 %.  LABS  Admission on 06/24/2023  Component Date Value Ref Range Status   Sodium 06/24/2023 139  135 - 145 mmol/L Final   Potassium 06/24/2023 3.1 (L)  3.5 - 5.1 mmol/L Final   Chloride 06/24/2023 102  98 - 111 mmol/L Final   CO2 06/24/2023 25  22 - 32 mmol/L Final   Glucose, Bld 06/24/2023 101 (H)  70 - 99 mg/dL Final   Glucose reference range applies only to samples taken after fasting for at least 8 hours.   BUN 06/24/2023 16  8 - 23 mg/dL Final   Creatinine, Ser 06/24/2023 0.79  0.44 - 1.00 mg/dL Final   Calcium 16/09/9603 9.4  8.9 - 10.3 mg/dL Final   Total Protein 54/08/8118 8.4 (H)  6.5 - 8.1 g/dL Final   Albumin 14/78/2956 4.3  3.5 - 5.0 g/dL Final   AST 21/30/8657 25  15 - 41 U/L Final   ALT 06/24/2023 21  0 - 44 U/L Final   Alkaline Phosphatase 06/24/2023 59  38 - 126 U/L Final    Total Bilirubin 06/24/2023 0.7  0.3 - 1.2 mg/dL Final   GFR, Estimated 06/24/2023 >60  >60 mL/min Final   Comment: (NOTE) Calculated using the CKD-EPI Creatinine Equation (2021)    Anion gap 06/24/2023 12  5 - 15 Final   Performed at [facility], [address] [place], [place] [address]   Alcohol, Ethyl (B) 06/24/2023 <10  <10 mg/dL Final   Comment: (NOTE) Lowest detectable limit for serum alcohol  is 10 mg/dL.  For medical purposes only. Performed at [facility], [address], [place], [place] [address]    Salicylate Lvl 06/24/2023 <7.0 (L)  7.0 - 30.0 mg/dL Final   Performed at [facility], [address], [place], [place] [address]   Acetaminophen (Tylenol), Serum 06/24/2023 <10 (L)  10 - 30 ug/mL Final   Comment: (NOTE) Therapeutic concentrations vary significantly. A range of 10-30 ug/mL  may be an effective concentration for many patients. However, some  are best treated at concentrations outside of this range. Acetaminophen concentrations >150 ug/mL at 4 hours after ingestion  and >50 ug/mL at 12 hours after ingestion are often associated with  toxic reactions.  Performed at [facility], [address], [place], [place] [address]    WBC 06/24/2023 5.7  4.0 - 10.5 K/uL Final   RBC 06/24/2023 4.55  3.87 - 5.11 MIL/uL Final   Hemoglobin 06/24/2023 12.5  12.0 - 15.0 g/dL Final   HCT 91/47/8295 40.1  36.0 - 46.0 % Final   MCV 06/24/2023 88.1  80.0 - 100.0 fL Final   MCH 06/24/2023 27.5  26.0 - 34.0 pg Final   MCHC 06/24/2023 31.2  30.0 - 36.0 g/dL Final   RDW 62/13/0865 14.6  11.5 - 15.5 % Final   Platelets 06/24/2023 236  150 - 400 K/uL Final   nRBC 06/24/2023 0.0  0.0 - 0.2 % Final   Performed at [facility], [address] [place], [place] [address]   Opiates 06/24/2023 NONE DETECTED  NONE DETECTED Final   Cocaine 06/24/2023 NONE DETECTED  NONE DETECTED Final    Benzodiazepines 06/24/2023 NONE DETECTED  NONE DETECTED Final   Amphetamines 06/24/2023 NONE DETECTED  NONE DETECTED Final   Tetrahydrocannabinol 06/24/2023 NONE DETECTED  NONE DETECTED Final   Barbiturates 06/24/2023 NONE DETECTED  NONE DETECTED Final   Comment: (NOTE) DRUG SCREEN FOR MEDICAL PURPOSES ONLY.  IF CONFIRMATION IS NEEDED FOR ANY PURPOSE, NOTIFY LAB WITHIN 5 DAYS.  LOWEST DETECTABLE LIMITS FOR URINE DRUG SCREEN Drug Class                     Cutoff (ng/mL) Amphetamine and metabolites    1000 Barbiturate and metabolites    200 Benzodiazepine                 200 Opiates and metabolites        300 Cocaine and metabolites        300 THC                            50 Performed at [facility], [address], [place], [place] [address]     PSYCHIATRIC REVIEW OF SYSTEMS (ROS)  ROS: Notable for the following relevant positive findings: ROS  Additional findings:      Musculoskeletal: No abnormal movements observed      Gait & Station: Laying/Sitting      Pain Screening: Present - mild to moderate      Nutrition & Dental Concerns: none reported  RISK FORMULATION/ASSESSMENT  Is the patient experiencing any suicidal or homicidal ideations: No       Explain if yes:  Protective factors considered for safety management: supportive son, access to appropriate clinical interventions  Risk factors/concerns considered for safety management:  Physical illness/chronic pain Age over 70 Aggression  Is there a safety management plan with the  patient and treatment team to minimize risk factors and promote protective factors: Yes           Explain: admit to psych Is crisis care placement or psychiatric hospitalization recommended: Yes     Based on my current evaluation and risk assessment, patient is determined at this time to be at:  Moderate Risk  *RISK ASSESSMENT Risk assessment is a dynamic process; it is possible that this patient's condition, and risk  level, may change. This should be re-evaluated and managed over time as appropriate. Please re-consult psychiatric consult services if additional assistance is needed in terms of risk assessment and management. If your team decides to discharge this patient, please advise the patient how to best access emergency psychiatric services, or to call 911, if their condition worsens or they feel unsafe in any way.   Norval Morton, NP Telepsychiatry Consult Services

## 2023-06-25 NOTE — Progress Notes (Signed)
Patient has been denied by [facility] due to no appropriate beds available. Patient meets BH inpatient criteria per Fayette Pho, NP. Patient has been faxed out to the following facilities:   [facility] Pending - Request SentN/A[address] Claris Gower [place] [phone]--[facility] Pending - Request [address] [place] [phone]--EXBMW-UXLKGMW [facility] Pending - Request SentN/A[address] Rhodia Albright [facility] [phone]--[facility] Pending - Request SentN/[address] [facility] [phone]--RKYHC-W[facility] Pending - Request [phone] [place] [address], [facility] [place] [phone]--[facility] Pending - Request [address] [place], [place] [phone]--[facility] Pending - Request SentN/[address] [place] [phone]--[facility] Pending - Request [phone] Rande Lawman [facility] [phone]--[facility] Pending - Request SentN/A[address] Genevie Cheshire [place] [phone]--[facility] Pending - Request SentN/[address] [place] [phone]--IRCVE-LFYBO [facility] Pending - Request [address] [place] [phone]--VQMGQ-QPYPPJ [facility] Pending - Request SentN/[address] [facility] [phone]--QBHAL-PFXT [facility] Pending - Request [address] [place] [phone]--LNLGX-QJJ [facility] Pending - Request [phone] [address] [place] [phone]--JOINO-MVEH [facility] Pending - Request [facility] [place] [phone]--[facility] Pending - Request SentN/A[address] [facility] Pending - Request SentN/[address] [place], [place] [phone]--TTSVX[facility] Pending - Request SentN/[address], [place] [phone]--KAJGO-TLXB [facility] Pending - Request [facility] [place] [phone]--YYQMG-NOIBB [facility] Pending - Request [address] [place] [phone]--  Damita Dunnings, MSW, LCSW-A  10:08 PM 06/25/2023

## 2023-06-25 NOTE — Telephone Encounter (Signed)
Tim left VM that he needs to complete some information for patient's home care. Need to call him back and find out exactly what he is needing.

## 2023-06-25 NOTE — ED Notes (Signed)
APS worker at bedside.

## 2023-06-25 NOTE — ED Notes (Signed)
TTS at bedside. 

## 2023-06-26 NOTE — Progress Notes (Signed)
[facility] Psych ED Progress Note  06/26/2023 2:50 PM Amanda Hardin  MRN:  [id number]   Subjective:  Patient is a 67 year old female with a history of vascular dementia, hypertension, breast cancer, type 2 diabetes, anxiety, arthritis and GERD who was IVC'd and brought to the emergency department for bizarre behavior.     Upon assessment today, patient is cooperative and relaxed.  When asked, she is unable to state why she is at the hospital.  When asked if I could call her son, she said "maybe my son brought me; that is a good idea" She states that she takes all of her medication every day as prescribed.  She states she has no problems and would like to go home.  She denies suicidal ideation, homicidal ideation, and hallucinations.  When asked about getting lost, wandering around outside or any of the aggressive behaviors noted in the IVC complaint, patient denies all.  When asked if she has been driving, patient refused to answer directly and simply stated "the grocery store is right next door."     Collateral was obtained from the son, Alford Highland [phone] and he describes a series of behaviors including wandering outside at night, forgetting how to open the car door, getting lost, being aggressive, finding things in unusual places within her home and problems with forgetting to pay bills.  Son stated he is pursuing guardianship.  The patient does not meet inpatient psychiatric hospitalization or IVC criteria at this time.  Patient is psychiatrically cleared.      Principal Problem: Dementia with behavioral disturbance (HCC) Diagnosis:  Principal Problem:   Dementia with behavioral disturbance [facility])   ED Assessment Time Calculation: Start Time: 1230 Stop Time: 1400 Total Time in Minutes (Assessment Completion): 90   Past Psychiatric History: History of anxiety and depression diagnosis.  [place] Scale:  Flowsheet Row ED from 06/24/2023 in [facility] Emergency Department at [facility]  C-SSRS RISK CATEGORY No Risk       Past Medical History:  Past Medical History:  Diagnosis Date   Anxiety    Arthritis    Complication of anesthesia    Difficulty waking up.   Cough    Depression    GERD (gastroesophageal reflux disease)    Hyperlipidemia    Hypertension    Thyroid nodule    Torn meniscus    R     Past Surgical History:  Procedure Laterality Date   BREAST BIOPSY Left 09/2015   [place] core bx  NEG    BREAST BIOPSY Right    both core and excisional done, multiples for fibroadenomas   BREAST BIOPSY Right 10/26/2020   stereo biopsy, clip, [facility]   BREAST EXCISIONAL BIOPSY Left 2014   fibroadenomas   BREAST LUMPECTOMY Right 12/07/2020   [facility]   BREAST LUMPECTOMY WITH NEEDLE LOCALIZATION AND AXILLARY SENTINEL LYMPH NODE BX Right 12/07/2020   Procedure: BREAST LUMPECTOMY WITH NEEDLE LOCALIZATION AND AXILLARY SENTINEL LYMPH NODE BX;  Surgeon: Earline Mayotte, MD;  Location: [facility] ORS;  Service: General;  Laterality: Right;   CESAREAN SECTION     CYSTOSCOPY N/A 07/09/2018   Procedure: CYSTOSCOPY;  Surgeon: Conard Novak, MD;  Location: [facility] ORS;  Service: Gynecology;  Laterality: N/A;   ROBOTIC ASSISTED LAPAROSCOPIC OVARIAN CYSTECTOMY Bilateral 07/09/2018   Procedure: ROBOTIC ASSISTED LAPAROSCOPIC OVARIAN CYSTECTOMY;  Surgeon: Conard Novak, MD;  Location: [facility] ORS;  Service: Gynecology;  Laterality: Bilateral;   ROBOTIC ASSISTED TOTAL HYSTERECTOMY WITH BILATERAL SALPINGO OOPHERECTOMY  Bilateral 07/09/2018   Procedure: ROBOTIC ASSISTED TOTAL HYSTERECTOMY WITH BILATERAL SALPINGO OOPHORECTOMY;  Surgeon: Conard Novak, MD;  Location: [facility] ORS;  Service: Gynecology;  Laterality: Bilateral;   TONSILLECTOMY     Family History:  Family History  Problem Relation Age of Onset   Diabetes Maternal Aunt    Hypertension Maternal Aunt    Prostate cancer Father    Heart failure Father    Pancreatic cancer Paternal Aunt    Breast cancer Neg Hx    Bladder  Cancer Neg Hx    Kidney cancer Neg Hx    Family Psychiatric  History: None noted Social History:  Social History   Substance and Sexual Activity  Alcohol Use No     Social History   Substance and Sexual Activity  Drug Use No    Social History   Socioeconomic History   Marital status: Single    Spouse name: Not on file   Number of children: Not on file   Years of education: Not on file   Highest education level: Not on file  Occupational History   Not on file  Tobacco Use   Smoking status: Former    Years: 25    Types: Cigarettes    Quit date: 03/19/2002    Years since quitting: 21.2   Smokeless tobacco: Never  Vaping Use   Vaping Use: Never used  Substance and Sexual Activity   Alcohol use: No   Drug use: No   Sexual activity: Not Currently    Birth control/protection: Post-menopausal  Other Topics Concern   Not on file  Social History Narrative   Not on file   Social Determinants of Health   Financial Resource Strain: Not on file  Food Insecurity: Not on file  Transportation Needs: Not on file  Physical Activity: Unknown (06/10/2018)   Exercise Vital Sign    Days of Exercise per Week: 7 days    Minutes of Exercise per Session: Not on file  Stress: Not on file  Social Connections: Not on file    Sleep: Good  Appetite:  Good  Current Medications: Current Facility-Administered Medications  Medication Dose Route Frequency Provider Last Rate Last Admin   donepezil (ARICEPT) tablet 10 mg  10 mg Oral QHS Candis Shine, Sherifat, NP   10 mg at 06/25/23 2225   hydrochlorothiazide (HYDRODIURIL) tablet 12.5 mg  12.5 mg Oral Daily Michelle Piper, PA-C   12.5 mg at 06/26/23 1002   LORazepam (ATIVAN) injection 1 mg  1 mg Intramuscular BID PRN Norval Morton, NP       LORazepam (ATIVAN) tablet 1 mg  1 mg Oral BID PRN Norval Morton, NP       OLANZapine (ZYPREXA) injection 5 mg  5 mg Intramuscular BID PRN Candis Shine, Sherifat, NP       OLANZapine zydis (ZYPREXA)  disintegrating tablet 5 mg  5 mg Oral BID PRN Norval Morton, NP       Current Outpatient Medications  Medication Sig Dispense Refill   Cholecalciferol (VITAMIN D3 PO) Take 1 capsule by mouth daily.     diclofenac (VOLTAREN) 75 MG EC tablet Take 75 mg by mouth 2 (two) times daily.     donepezil (ARICEPT) 10 MG tablet Take 1 tablet (10 mg total) by mouth at bedtime. 30 tablet 11   DULoxetine (CYMBALTA) 30 MG capsule Take 1 capsule (30 mg total) by mouth daily. Take with 60mg  tab for a total of 90 mg (Patient taking differently: Take 30-60 mg by  mouth See admin instructions. Take 60 mg by mouth in the morning and 30 mg at bedtime) 90 capsule 3   enalapril (VASOTEC) 10 MG tablet Take 10 mg by mouth in the morning.     gabapentin (NEURONTIN) 300 MG capsule Take 300 mg by mouth at bedtime.     hydrochlorothiazide (HYDRODIURIL) 12.5 MG tablet Take 1 tablet (12.5 mg total) by mouth daily. 90 tablet 3   letrozole (FEMARA) 2.5 MG tablet TAKE 1 TABLET(2.5 MG) BY MOUTH DAILY (Patient taking differently: Take 2.5 mg by mouth in the morning.) 30 tablet 3   meclizine (ANTIVERT) 25 MG tablet Take 25 mg by mouth daily as needed for dizziness.      Multiple Vitamins-Minerals (CENTRUM WOMEN) TABS Take 1 tablet by mouth daily with breakfast.     pravastatin (PRAVACHOL) 40 MG tablet Take 1 tablet (40 mg total) by mouth at bedtime. 90 tablet 3   pregabalin (LYRICA) 150 MG capsule Take 150-300 mg by mouth See admin instructions. Take 150 mg by mouth in the morning and 300 mg at bedtime      Lab Results:  Results for orders placed or performed during the hospital encounter of 06/24/23 (from the past 48 hour(s))  Comprehensive metabolic panel     Status: Abnormal   Collection Time: 06/24/23  5:13 PM  Result Value Ref Range   Sodium 139 135 - 145 mmol/L   Potassium 3.1 (L) 3.5 - 5.1 mmol/L   Chloride 102 98 - 111 mmol/L   CO2 25 22 - 32 mmol/L   Glucose, Bld 101 (H) 70 - 99 mg/dL    Comment: Glucose reference  range applies only to samples taken after fasting for at least 8 hours.   BUN 16 8 - 23 mg/dL   Creatinine, Ser 1.61 0.44 - 1.00 mg/dL   Calcium 9.4 8.9 - 09.6 mg/dL   Total Protein 8.4 (H) 6.5 - 8.1 g/dL   Albumin 4.3 3.5 - 5.0 g/dL   AST 25 15 - 41 U/L   ALT 21 0 - 44 U/L   Alkaline Phosphatase 59 38 - 126 U/L   Total Bilirubin 0.7 0.3 - 1.2 mg/dL   GFR, Estimated >04 >54 mL/min    Comment: (NOTE) Calculated using the CKD-EPI Creatinine Equation (2021)    Anion gap 12 5 - 15    Comment: Performed at [facility], [address] [place], [place] [address]  Ethanol     Status: None   Collection Time: 06/24/23  5:13 PM  Result Value Ref Range   Alcohol, Ethyl (B) <10 <10 mg/dL    Comment: (NOTE) Lowest detectable limit for serum alcohol is 10 mg/dL.  For medical purposes only. Performed at [facility], [address], [place], [place] [address]   Salicylate level     Status: Abnormal   Collection Time: 06/24/23  5:13 PM  Result Value Ref Range   Salicylate Lvl <7.0 (L) 7.0 - 30.0 mg/dL    Comment: Performed at [facility], [address] [place], [place] [address]  Acetaminophen level     Status: Abnormal   Collection Time: 06/24/23  5:13 PM  Result Value Ref Range   Acetaminophen (Tylenol), Serum <10 (L) 10 - 30 ug/mL    Comment: (NOTE) Therapeutic concentrations vary significantly. A range of 10-30 ug/mL  may be an effective concentration for many patients. However, some  are best treated at concentrations outside of this range. Acetaminophen concentrations >150  ug/mL at 4 hours after ingestion  and >50 ug/mL at 12 hours after ingestion are often associated with  toxic reactions.  Performed at [facility], [address] [place], [place] [address]   cbc     Status: None   Collection Time: 06/24/23  5:13 PM  Result Value Ref Range   WBC 5.7 4.0 - 10.5 K/uL   RBC 4.55 3.87 - 5.11 MIL/uL    Hemoglobin 12.5 12.0 - 15.0 g/dL   HCT 13.0 86.5 - 78.4 %   MCV 88.1 80.0 - 100.0 fL   MCH 27.5 26.0 - 34.0 pg   MCHC 31.2 30.0 - 36.0 g/dL   RDW 69.6 29.5 - 28.4 %   Platelets 236 150 - 400 K/uL   nRBC 0.0 0.0 - 0.2 %    Comment: Performed at [facility], [address], [place], [place] [address]  Rapid urine drug screen (hospital performed)     Status: None   Collection Time: 06/24/23  7:36 PM  Result Value Ref Range   Opiates NONE DETECTED NONE DETECTED   Cocaine NONE DETECTED NONE DETECTED   Benzodiazepines NONE DETECTED NONE DETECTED   Amphetamines NONE DETECTED NONE DETECTED   Tetrahydrocannabinol NONE DETECTED NONE DETECTED   Barbiturates NONE DETECTED NONE DETECTED    Comment: (NOTE) DRUG SCREEN FOR MEDICAL PURPOSES ONLY.  IF CONFIRMATION IS NEEDED FOR ANY PURPOSE, NOTIFY LAB WITHIN 5 DAYS.  LOWEST DETECTABLE LIMITS FOR URINE DRUG SCREEN Drug Class                     Cutoff (ng/mL) Amphetamine and metabolites    1000 Barbiturate and metabolites    200 Benzodiazepine                 200 Opiates and metabolites        300 Cocaine and metabolites        300 THC                            50 Performed at [facility], [address], [place], [place] [address]     Blood Alcohol level:  Lab Results  Component Value Date   [facility] <10 06/24/2023     Musculoskeletal: Strength & Muscle Tone: within normal limits Gait & Station: normal Patient leans: N/A  Psychiatric Specialty Exam:  Presentation  General Appearance:  Appropriate for Environment  Eye Contact: Fleeting  Speech: Normal Rate  Speech Volume: Normal  Handedness: Ambidextrous   Mood and Affect  Mood: Euthymic  Affect: Congruent   Thought Process  Thought Processes: Coherent  Descriptions of Associations:Circumstantial  Orientation:Partial (Oriented to self and place)  Thought Content:Illogical  History of  Schizophrenia/Schizoaffective disorder:No data recorded Duration of Psychotic Symptoms:No data recorded Hallucinations:Hallucinations: None  Ideas of Reference:None  Suicidal Thoughts:Suicidal Thoughts: No  Homicidal Thoughts:Homicidal Thoughts: No   Sensorium  Memory: Immediate Poor; Recent Poor; Remote Poor  Judgment: Impaired  Insight: Poor   Executive Functions  Concentration: Fair  Attention Span: Fair  Recall: Poor  Fund of Knowledge: Fair  Language: Good   Psychomotor Activity  Psychomotor Activity: Psychomotor Activity: Normal   Assets  Assets: Social Support; Housing   Sleep  Sleep: Sleep: Fair    Physical Exam: Physical Exam Vitals and nursing note reviewed.  Constitutional:      Appearance: Normal appearance.  Eyes:     Pupils: Pupils are  equal, round, and reactive to light.  Pulmonary:     Effort: Pulmonary effort is normal.  Skin:    General: Skin is dry.  Neurological:     Mental Status: She is alert.     Comments: Oriented to self and place    Review of Systems  Psychiatric/Behavioral:  Positive for memory loss.   All other systems reviewed and are negative.  Blood pressure (!) 143/65, pulse 62, temperature 98.6 F (37 C), temperature source Oral, resp. rate 16, height 5' (1.524 m), weight 48 kg, SpO2 97 %. Body mass index is 20.67 kg/m.   Medical Decision Making: Patient case reviewed and discussed with Dr Viviano Simas.  Patient does not meet criteria for inpatient psychiatric hospitalization.  She is psychiatrically cleared.  Problem 1: Dementia with behavioral disturbance -Continue aricept 10mg  PO Q HS   Thomes Lolling, NP 06/26/2023, 2:50 PM

## 2023-06-26 NOTE — Progress Notes (Signed)
Per Thomes Lolling, NP pt has been psych cleared. This CSW will now remove pt from the [facility] shift report. TOC CSW team and involved and assisting as needed.  Maryjean Ka, MSW, [facility] 06/26/2023 8:59 PM

## 2023-06-26 NOTE — ED Notes (Signed)
Pt remain asleep, with no signs of distress or discomfort.

## 2023-06-26 NOTE — Telephone Encounter (Signed)
Email was sent with patients current medication list.

## 2023-06-26 NOTE — Progress Notes (Signed)
CSW contacted patients APS worker , Ferrel Logan [phone]. CSW left a message requesting a call back.

## 2023-06-26 NOTE — Progress Notes (Signed)
Spoke with pt's son who states he is unable to pick the pt up until tomorrow. Pt's son, Marcial Pacas, reports he will begin the guardianship process as soon as possible. He reported he'd been making phone calls all day. TOC will attempt to contact APS worker to coordinate discharge plan and supports.

## 2023-06-26 NOTE — ED Notes (Signed)
Sleeping respirations appear easy no distress noted

## 2023-06-26 NOTE — ED Provider Notes (Signed)
  Physical Exam  BP (!) 155/60 (BP Location: Left Arm)   Pulse 60   Temp 98.4 F (36.9 C) (Oral)   Resp 16   Ht 5' (1.524 m)   Wt 48 kg   SpO2 97%   BMI 20.67 kg/m   Physical Exam  Procedures  Procedures  ED Course / MDM    Medical Decision Making  Patient pending placement.  Has been here for 40 hours.  History of dementia and it appears that is looking more for psych placement instead of nursing home memory care at this time.  No issues overnight       Benjiman Core, MD 06/26/23 0900

## 2023-06-26 NOTE — Progress Notes (Addendum)
This CSW contacted pt's son, Amanda Hardin, to inquire about pt's supports and plan for discharge. Amanda Hardin reported he lives in [place] and the pt does live alone and has recently displayed behaviors that concern he, her family, and her neighbors. Amanda Hardin stated at this time, he does not have the space for the pt to live with him; however, has hoped that maybe a friend would allow the pt to stay with them. Amanda Hardin reported he had not spoken to a friend but states it is a thought he'd had. He is currently in [place] and will be there through Saturday but states that his return to [place] could be delayed if needed.  Amanda Hardin stated it is difficult for him to try to assist the pt because she requests or refuses anything he suggests. Amanda Hardin reported pt tends to "not listen to people who are younger than her. She'll only respect what you say if you're her age or older." Amanda Hardin feels that part of this may be related to the pt's upbringing. Amanda Hardin expressed concerns for his mother's behaviors stating he is afraid that the behaviors will ruin friendships she has with people. This CSW inquired about whether he received any caregiver education for his mother at the time of her dementia diagnoses. Amanda Hardin reports he was not educated on the diagnoses or any signs or symptoms that may pertain to the diagnoses. He reports he was only told that she has Dementia. Amanda Hardin also stated he believed there were things about his mother's health that she had not disclosed to him. He did note that the pt does see a neurologist and states she is supposed to have an upcoming visit.   Amanda Hardin reported he'd been in communication with the pt's APS worker, Amanda Hardin [phone], email: [email or web address]) who informed him about Guardianship. Amanda Hardin states he plans to begin the process by the end of the day today. This CSW encouraged Amanda Hardin to pursue Guardianship as soon as possible due to feeling that pt is not making sound decisions  for herself. Amanda Hardin requested to speak with the psychiatry team and expressed that he is not aware of the proper level of care for his mother. This CSW did inform that typically Dementia pt's who are unable to be managed in the home are placed into Memory Care. This CSW informed Amanda Hardin he would need to start with DSS to see if his mother would qualify for Special Assistance Medicaid to be placed into Memory Care. Amanda Hardin expressed concern for safety and this CSW informed that there are facilities that have a secured/locked memory care unit. Amanda Hardin verbalized understanding.   This CSW outreached to Psych NP and requested she contact Amanda Hardin to discuss pt's assessments and presentation while here in the ER. TOC following.   Addend @ 3:00 PM Case discussed with [name] NP. She reports pt is not meeting criteria for inpatient placement. TOC will coordinate d/c plan with APS and son.

## 2023-06-26 NOTE — Telephone Encounter (Signed)
Spoke with patients son who was assisted via phone with basic information like office practice name location etc. Patient son requested for information to be sent via email of patients current medications due to patient currently being IVC at [facility] by son.

## 2023-06-27 NOTE — ED Notes (Signed)
Patient alert and oriented this shift.

## 2023-06-27 NOTE — ED Notes (Signed)
Patient alert this shift. Patient calm and cooperative.  Patient quiet and guarded. No suicidal ideation noted. No homicidal ideation.  Patient ambulatory and able to do ADLs.

## 2023-06-27 NOTE — Progress Notes (Addendum)
This CSW spoke with pt's son, Marcial Pacas. Marcial Pacas reported he is currently on his way to the courthouse to submit guardianship paperwork. His plan is to take the pt back to [place] if she agrees to go. If she does not, he will pick her up and stay with her in the home until guardianship has been granted. APS informed he may potentially be granted interim guardian and that the hearing is typically scheduled within a week. Marcial Pacas will call this CSW back with an update. TOC following.  Addend @ 1:43 PM Attempted to contact pt's son. Left HIPAA Compliant voicemail.

## 2023-06-27 NOTE — ED Provider Notes (Signed)
Emergency Medicine Observation Re-evaluation Note  Amanda Hardin is a 67 y.o. female, seen on rounds today.  Pt initially presented to the ED for complaints of IVC Currently, the patient is waiting placement.  Physical Exam  BP 123/62 (BP Location: Left Arm)   Pulse 63   Temp 99 F (37.2 C) (Oral)   Resp 18   Ht 5' (1.524 m)   Wt 48 kg   SpO2 99%   BMI 20.67 kg/m  Physical Exam General: Calm Cardiac: Well perfused Lungs: Even respirations Psych: Calm  ED Course / MDM  EKG:   I have reviewed the labs performed to date as well as medications administered while in observation.  Recent changes in the last 24 hours include patient's family pursuing guardianship.  Plan  Current plan is for placement.    Maia Plan, MD 06/27/23 [phone]

## 2023-06-27 NOTE — Progress Notes (Addendum)
*  Please do not share the following information with the patient at this time.*  This CSW received a call from pt's son, Marcial Pacas, informing that he has received interim guardianship and has the paperwork. He stated the pt will be served the paperwork in the morning. Timothy's plan is to take the pt back with him to [place] after she has been served. RN and EDP made aware.

## 2023-06-27 NOTE — Progress Notes (Addendum)
Attempted to contact pt's APS worker, Ferrel Logan [phone]). Left HIPAA Compliant voicemail.  Addend @ 9:36 AM This CSW spoke with pt's son who stated he is in the process of having the guardianship paperwork notarized. Pt's son and cousin inquired about private duty care and this CSW informed that private duty care is an out of pocket expense. This CSW did request that if Marcial Pacas (son) speaks to Ferrel Logan (APS) to have her return our call. TOC will collaborate with APS and pt's family to coordinate a discharge plan.   Addend @ 10:02 AM Outreached to [facility] via email requesting a call back. TOC following.

## 2023-06-27 NOTE — Discharge Instructions (Addendum)
Continue your medicines  See your doctor for follow-up.  If you have any behavioral health needs you can go to behavioral health center  Return to ER if you have hallucinations or thoughts of harming yourself or others  FREE  1) Alzheimer's Association 24/7 Helpline (1.780-707-7137) General guidance on legal, financial, and care options LimitLaws.hu  2) Senior Resources at [employer] 9785923019) Case assistance, Meals on Wheels, Armed forces logistics/support/administrative officer www.senior-resources-[place].org  FEE BASED SERVICES  1) [facility] [employer] (910) 331-4544) Senior Care Navigation Services, Legal/Financial Guidance Www.elderlawfirm.com  2) [employer] 361-414-6821) Senior Care Navigation Services, Legal/Financial Guidance Www.navigateseniorcare.com  Day Care 1) [employer] (432)372-5980) Half Day and Full Day Porgrams Www.well-springsolutions.com  Personal Caregiving 2) [employer] [phone]) 20 hours/week up to 24/7 or live-in Private Pay or Long Term Care insurance accepted [email or web address]

## 2023-06-27 NOTE — ED Provider Notes (Signed)
Patient initially presented to ED under IVC for dementia and inability to care for self with aggressive behaviors. Updated this evening that the son was granted interim guardianship and the patient will be served tomorrow morning. After she is served, son plans to take her back with him to [place].    Rexford Maus, DO 06/27/23 1650

## 2023-06-28 NOTE — ED Notes (Signed)
Pt out to nurse station asked how she got in the hospital and why she was hospitalized. Currently appears lucid and alert but reports no recollection of what has happen over the last 4 days. After repetative conversation about about the last 4 days Amanda Hardin returned to her room and bed.

## 2023-06-28 NOTE — Progress Notes (Addendum)
Pt's son reported he will be here to pick pt up at [facility]. States the [facility] office told him they will serve pt between 10:30-11AM. EDP and RN aware.  Addend @ 1:35 PM Son reported he is on the way to the courthouse to go over paperwork once more and plans to pick the pt up once he leaves.

## 2023-06-28 NOTE — ED Provider Notes (Signed)
Emergency Medicine Observation Re-evaluation Note  Amanda Hardin is a 67 y.o. female, seen on rounds today.  Pt initially presented to the ED for complaints of IVC Currently, the patient is awake and alert.  Pt is asking why she is here and is angry she can't leave.  Pt was IVC'd by her son due to worsening dementia and behavior problems.  He is working on getting guardianship and plans to take her back to [place].   Physical Exam  BP (!) 151/62 (BP Location: Left Arm)   Pulse (!) 57   Temp 98.3 F (36.8 C) (Oral)   Resp 16   Ht 5' (1.524 m)   Wt 48 kg   SpO2 99%   BMI 20.67 kg/m  Physical Exam General: awake and alert Cardiac: rr Lungs: clear Psych: calm  ED Course / MDM  EKG:   I have reviewed the labs performed to date as well as medications administered while in observation.  Recent changes in the last 24 hours include none.  Plan  Current plan is for [place].    Jacalyn Lefevre, MD 06/28/23 [phone]

## 2023-06-28 NOTE — Progress Notes (Signed)
Son is picking pt up between 9PM-10PM. EDP to order pt to receive prn meds for the ride to [place]. MD and RN made aware. TOC to follow.   Valentina Shaggy.Khamryn Calderone, MSW, LCSWA [facility]  Transitions of Care Clinical Social Worker I Direct Dial:   Fax: [phone] [email or web address]

## 2023-06-28 NOTE — ED Notes (Signed)
Pt remains asleep with no signs of distress or discomfort, respirations are easy , sitter remains at bed side.Marland Kitchen

## 2023-06-28 NOTE — Progress Notes (Addendum)
This CSW spoke with pt's son who informed he will be headed to pick the pt up in 20 minutes. Notified RN who is aware and plans to give pt her night meds and prn.   Pt's son requested pt's medical records to support his request for guardianship.This CSW provided pt's son with medical records contact number/email and informed he'd need to provide POA paperwork at the time of the request.

## 2023-07-02 ENCOUNTER — Telehealth: Payer: Self-pay | Admitting: Internal Medicine

## 2023-07-02 NOTE — Telephone Encounter (Signed)
Patient's son called needing access to patient's diagnosises, needing progress notes to apply for guardianship. Needs this by no later than this Thursday.   [phone]

## 2023-07-15 ENCOUNTER — Telehealth: Payer: Self-pay

## 2023-07-15 ENCOUNTER — Other Ambulatory Visit: Payer: Self-pay

## 2023-07-15 MED ORDER — DICLOFENAC SODIUM 75 MG PO TBEC: 75.0000 mg | DELAYED_RELEASE_TABLET | Freq: Two times a day (BID) | ORAL | 0 refills | Status: DC

## 2023-07-15 NOTE — Telephone Encounter (Signed)
Spoke with son and have sent off Rx request along with referral request.

## 2023-07-15 NOTE — Telephone Encounter (Signed)
Patiens son called asking for call back

## 2023-07-22 ENCOUNTER — Inpatient Hospital Stay: Payer: No Typology Code available for payment source | Admitting: Nurse Practitioner

## 2023-07-22 ENCOUNTER — Telehealth: Payer: Self-pay | Admitting: Internal Medicine

## 2023-07-22 NOTE — Telephone Encounter (Signed)
Patient's son left VM wanting to know if [facility] can complete an FL2 for this patient so they can determine the care that she needs and can pursue options for her care. Please advise on if you'd be willing to do this.

## 2023-07-31 ENCOUNTER — Other Ambulatory Visit: Payer: Self-pay | Admitting: Internal Medicine

## 2023-07-31 DIAGNOSIS — F411 Generalized anxiety disorder: Secondary | ICD-10-CM

## 2023-08-05 ENCOUNTER — Ambulatory Visit: Payer: No Typology Code available for payment source | Admitting: Internal Medicine

## 2023-08-06 ENCOUNTER — Encounter: Payer: Self-pay | Admitting: Nurse Practitioner

## 2023-08-06 ENCOUNTER — Inpatient Hospital Stay: Payer: No Typology Code available for payment source | Attending: Oncology | Admitting: Nurse Practitioner

## 2023-08-08 ENCOUNTER — Ambulatory Visit (INDEPENDENT_AMBULATORY_CARE_PROVIDER_SITE_OTHER): Payer: No Typology Code available for payment source | Admitting: Internal Medicine

## 2023-08-08 ENCOUNTER — Encounter: Payer: Self-pay | Admitting: Internal Medicine

## 2023-08-08 VITALS — BP 135/60 | HR 85 | Ht 60.0 in | Wt 115.6 lb

## 2023-08-08 DIAGNOSIS — G500 Trigeminal neuralgia: Secondary | ICD-10-CM

## 2023-08-08 DIAGNOSIS — E782 Mixed hyperlipidemia: Secondary | ICD-10-CM

## 2023-08-08 DIAGNOSIS — I10 Essential (primary) hypertension: Secondary | ICD-10-CM | POA: Diagnosis not present

## 2023-08-08 DIAGNOSIS — F01B4 Vascular dementia, moderate, with anxiety: Secondary | ICD-10-CM

## 2023-08-08 DIAGNOSIS — M7070 Other bursitis of hip, unspecified hip: Secondary | ICD-10-CM

## 2023-08-08 MED ORDER — DICLOFENAC SODIUM 75 MG PO TBEC
75.0000 mg | DELAYED_RELEASE_TABLET | Freq: Two times a day (BID) | ORAL | 0 refills | Status: AC
Start: 2023-08-08 — End: ?

## 2023-08-08 NOTE — Progress Notes (Signed)
Established Patient Office Visit  Subjective:  Patient ID: Amanda Hardin, female    DOB: Apr 17, 1956  Age: 67 y.o. MRN: [id number]  Chief Complaint  Patient presents with   Follow-up    2 mo f/u    Patient is here for her follow-up, accompanied by her son . Patient was recently admitted to the hospital for worsening dementia and behavioral problems. Her son is trying to set her up at her nursing home facility with a memory floor.  FL 2 forms have already been filled out. They are trying to find the most appropriate set up for the patient. She is in good spirits today, and is actually eating better and has gained some weight. Mentions some hip pain, has a prescription for Voltaren tablets 75 mg, will send in a refill.    No other concerns at this time.   Past Medical History:  Diagnosis Date   Anxiety    Arthritis    Complication of anesthesia    Difficulty waking up.   Cough    Depression    GERD (gastroesophageal reflux disease)    Hyperlipidemia    Hypertension    Thyroid nodule    Torn meniscus    R     Past Surgical History:  Procedure Laterality Date   BREAST BIOPSY Left 09/2015   [place] core bx  NEG    BREAST BIOPSY Right    both core and excisional done, multiples for fibroadenomas   BREAST BIOPSY Right 10/26/2020   stereo [facility] clip, [facility]   BREAST EXCISIONAL BIOPSY Left 2014   fibroadenomas   BREAST LUMPECTOMY Right 12/07/2020   [facility]   BREAST LUMPECTOMY WITH NEEDLE LOCALIZATION AND AXILLARY SENTINEL LYMPH NODE BX Right 12/07/2020   Procedure: BREAST LUMPECTOMY WITH NEEDLE LOCALIZATION AND AXILLARY SENTINEL LYMPH NODE BX;  Surgeon: Earline Mayotte, MD;  Location: [facility] ORS;  Service: General;  Laterality: Right;   CESAREAN SECTION     CYSTOSCOPY N/A 07/09/2018   Procedure: CYSTOSCOPY;  Surgeon: Conard Novak, MD;  Location: [facility] ORS;  Service: Gynecology;  Laterality: N/A;   ROBOTIC ASSISTED LAPAROSCOPIC OVARIAN CYSTECTOMY Bilateral 07/09/2018    Procedure: ROBOTIC ASSISTED LAPAROSCOPIC OVARIAN CYSTECTOMY;  Surgeon: Conard Novak, MD;  Location: [facility] ORS;  Service: Gynecology;  Laterality: Bilateral;   ROBOTIC ASSISTED TOTAL HYSTERECTOMY WITH BILATERAL SALPINGO OOPHERECTOMY Bilateral 07/09/2018   Procedure: ROBOTIC ASSISTED TOTAL HYSTERECTOMY WITH BILATERAL SALPINGO OOPHORECTOMY;  Surgeon: Conard Novak, MD;  Location: [facility] ORS;  Service: Gynecology;  Laterality: Bilateral;   TONSILLECTOMY      Social History   Socioeconomic History   Marital status: Single    Spouse name: Not on file   Number of children: Not on file   Years of education: Not on file   Highest education level: Not on file  Occupational History   Not on file  Tobacco Use   Smoking status: Former    Current packs/day: 0.00    Types: Cigarettes    Start date: 03/19/1977    Quit date: 03/19/2002    Years since quitting: 21.4   Smokeless tobacco: Never  Vaping Use   Vaping status: Never Used  Substance and Sexual Activity   Alcohol use: No   Drug use: No   Sexual activity: Not Currently    Birth control/protection: Post-menopausal  Other Topics Concern   Not on file  Social History Narrative   Not on file   Social Determinants of Health   Financial Resource Strain: Patient  Declined (12/14/2022)   [facility] from [facility], [facility]   Overall Financial Resource Strain (CARDIA)    Difficulty of Paying Living Expenses: Patient declined  Food Insecurity: Patient Declined (12/14/2022)   Received from [facility], [facility]   Hunger Vital Sign    Worried About Running Out of Food in the Last Year: Patient declined    Ran Out of Food in the Last Year: Patient declined  Transportation Needs: Not on file  Physical Activity: Unknown (06/10/2018)   Exercise Vital Sign    Days of Exercise per Week: 7 days    Minutes of Exercise per Session: Not on file  Stress: Not on file   Social Connections: Not on file  Intimate Partner Violence: Not on file    Family History  Problem Relation Age of Onset   Diabetes Maternal Aunt    Hypertension Maternal Aunt    Prostate cancer Father    Heart failure Father    Pancreatic cancer Paternal Aunt    Breast cancer Neg Hx    Bladder Cancer Neg Hx    Kidney cancer Neg Hx     Allergies  Allergen Reactions   Penicillins Other (See Comments)    Allergic reaction happened during childhood- not recalled   Norco [Hydrocodone-Acetaminophen] Nausea And Vomiting   Silicone Rash   Tape Rash    Review of Systems  Constitutional: Negative.  Negative for chills, fever, malaise/fatigue and weight loss.  HENT: Negative.    Eyes: Negative.   Respiratory: Negative.  Negative for cough and shortness of breath.   Cardiovascular: Negative.  Negative for chest pain, palpitations and leg swelling.  Gastrointestinal: Negative.  Negative for abdominal pain, constipation, diarrhea, heartburn, nausea and vomiting.  Genitourinary: Negative.  Negative for dysuria and flank pain.  Musculoskeletal:  Positive for joint pain (hip and shoulder). Negative for myalgias.  Skin: Negative.   Neurological: Negative.  Negative for dizziness and headaches.  Endo/Heme/Allergies: Negative.   Psychiatric/Behavioral: Negative.  Negative for depression and suicidal ideas. The patient is not nervous/anxious.        Objective:   BP 135/60   Pulse 85   Ht 5' (1.524 m)   Wt 115 lb 9.6 oz (52.4 kg)   SpO2 98%   BMI 22.58 kg/m   Vitals:   08/08/23 1407  BP: 135/60  Pulse: 85  Height: 5' (1.524 m)  Weight: 115 lb 9.6 oz (52.4 kg)  SpO2: 98%  BMI (Calculated): 22.58    Physical Exam Vitals and nursing note reviewed.  Constitutional:      Appearance: Normal appearance.  HENT:     Head: Normocephalic and atraumatic.     Nose: Nose normal.     Mouth/Throat:     Mouth: Mucous membranes are moist.     Pharynx: Oropharynx is clear.  Eyes:      Conjunctiva/sclera: Conjunctivae normal.     Pupils: Pupils are equal, round, and reactive to light.  Cardiovascular:     Rate and Rhythm: Normal rate and regular rhythm.     Pulses: Normal pulses.     Heart sounds: Normal heart sounds. No murmur heard. Pulmonary:     Effort: Pulmonary effort is normal.     Breath sounds: Normal breath sounds. No wheezing.  Abdominal:     General: Bowel sounds are normal.     Palpations: Abdomen is soft.     Tenderness: There is no abdominal tenderness. There is no  right CVA tenderness or left CVA tenderness.  Musculoskeletal:        General: Normal range of motion.     Cervical back: Normal range of motion.     Right lower leg: No edema.     Left lower leg: No edema.  Skin:    General: Skin is warm and dry.  Neurological:     General: No focal deficit present.     Mental Status: She is alert and oriented to person, place, and time.  Psychiatric:        Mood and Affect: Mood normal.        Behavior: Behavior normal.      No results found for any visits on 08/08/23.  Recent Results (from the past 2160 hour(s))  Comprehensive metabolic panel     Status: Abnormal   Collection Time: 06/24/23  5:13 PM  Result Value Ref Range   Sodium 139 135 - 145 mmol/L   Potassium 3.1 (L) 3.5 - 5.1 mmol/L   Chloride 102 98 - 111 mmol/L   CO2 25 22 - 32 mmol/L   Glucose, Bld 101 (H) 70 - 99 mg/dL    Comment: Glucose reference range applies only to samples taken after fasting for at least 8 hours.   BUN 16 8 - 23 mg/dL   Creatinine, Ser 8.29 0.44 - 1.00 mg/dL   Calcium 9.4 8.9 - 56.2 mg/dL   Total Protein 8.4 (H) 6.5 - 8.1 g/dL   Albumin 4.3 3.5 - 5.0 g/dL   AST 25 15 - 41 U/L   ALT 21 0 - 44 U/L   Alkaline Phosphatase 59 38 - 126 U/L   Total Bilirubin 0.7 0.3 - 1.2 mg/dL   GFR, Estimated >13 >08 mL/min    Comment: (NOTE) Calculated using the CKD-EPI Creatinine Equation (2021)    Anion gap 12 5 - 15    Comment: Performed at [facility], [address] [place], [place] [address]  Ethanol     Status: None   Collection Time: 06/24/23  5:13 PM  Result Value Ref Range   Alcohol, Ethyl (B) <10 <10 mg/dL    Comment: (NOTE) Lowest detectable limit for serum alcohol is 10 mg/dL.  For medical purposes only. Performed at [facility], [address], [place], [place] [address]   Salicylate level     Status: Abnormal   Collection Time: 06/24/23  5:13 PM  Result Value Ref Range   Salicylate Lvl <7.0 (L) 7.0 - 30.0 mg/dL    Comment: Performed at [facility], [address], [place], [place] [address]  Acetaminophen level     Status: Abnormal   Collection Time: 06/24/23  5:13 PM  Result Value Ref Range   Acetaminophen (Tylenol), Serum <10 (L) 10 - 30 ug/mL    Comment: (NOTE) Therapeutic concentrations vary significantly. A range of 10-30 ug/mL  may be an effective concentration for many patients. However, some  are best treated at concentrations outside of this range. Acetaminophen concentrations >150 ug/mL at 4 hours after ingestion  and >50 ug/mL at 12 hours after ingestion are often associated with  toxic reactions.  Performed at [facility], [address], [place], [place] [address]   cbc     Status: None   Collection Time: 06/24/23  5:13 PM  Result Value Ref Range   WBC 5.7 4.0 - 10.5 K/uL   RBC 4.55 3.87 - 5.11 MIL/uL   Hemoglobin 12.5  12.0 - 15.0 g/dL   HCT 16.1 09.6 - 04.5 %   MCV 88.1 80.0 - 100.0 fL   MCH 27.5 26.0 - 34.0 pg   MCHC 31.2 30.0 - 36.0 g/dL   RDW 40.9 81.1 - 91.4 %   Platelets 236 150 - 400 K/uL   nRBC 0.0 0.0 - 0.2 %    Comment: Performed at [facility], [address] [place], [place] [address]  Rapid urine drug screen (hospital performed)     Status: None   Collection Time: 06/24/23  7:36 PM  Result Value Ref Range   Opiates NONE DETECTED NONE DETECTED   Cocaine NONE DETECTED NONE DETECTED    Benzodiazepines NONE DETECTED NONE DETECTED   Amphetamines NONE DETECTED NONE DETECTED   Tetrahydrocannabinol NONE DETECTED NONE DETECTED   Barbiturates NONE DETECTED NONE DETECTED    Comment: (NOTE) DRUG SCREEN FOR MEDICAL PURPOSES ONLY.  IF CONFIRMATION IS NEEDED FOR ANY PURPOSE, NOTIFY LAB WITHIN 5 DAYS.  LOWEST DETECTABLE LIMITS FOR URINE DRUG SCREEN Drug Class                     Cutoff (ng/mL) Amphetamine and metabolites    1000 Barbiturate and metabolites    200 Benzodiazepine                 200 Opiates and metabolites        300 Cocaine and metabolites        300 THC                            50 Performed at [facility], [address] [place], [place] [address]       Assessment & Plan:  Patient encouraged to take her medications regularly.  Eat proper meals.  They will let us know once she is established at her nursing home. Problem List Items Addressed This Visit     Trigeminal neuralgia   Moderate vascular dementia with anxiety (HCC)   Mixed hyperlipidemia   Essential hypertension, benign - Primary   Relevant Orders   Basic metabolic panel   Other Visit Diagnoses     Bursitis of hip, unspecified bursa, unspecified laterality       Relevant Medications   diclofenac (VOLTAREN) 75 MG EC tablet       Return in about 2 months (around 10/08/2023).   Total time spent: 30 minutes  Margaretann Loveless, MD  08/08/2023   This document may have been prepared by [facility] Voice Recognition software and as such may include unintentional dictation errors.

## 2023-08-09 LAB — BASIC METABOLIC PANEL
BUN/Creatinine Ratio: 24 (ref 12–28)
BUN: 22 mg/dL (ref 8–27)
CO2: 27 mmol/L (ref 20–29)
Calcium: 9.5 mg/dL (ref 8.7–10.3)
Chloride: 104 mmol/L (ref 96–106)
Creatinine, Ser: 0.9 mg/dL (ref 0.57–1.00)
Glucose: 88 mg/dL (ref 70–99)
Potassium: 4.1 mmol/L (ref 3.5–5.2)
Sodium: 146 mmol/L — ABNORMAL HIGH (ref 134–144)
eGFR: 69 mL/min/{1.73_m2} (ref >59)

## 2023-09-12 ENCOUNTER — Ambulatory Visit: Payer: No Typology Code available for payment source | Admitting: Radiation Oncology

## 2023-09-25 ENCOUNTER — Ambulatory Visit: Payer: No Typology Code available for payment source | Attending: Radiation Oncology | Admitting: Radiation Oncology

## 2023-10-08 ENCOUNTER — Ambulatory Visit (INDEPENDENT_AMBULATORY_CARE_PROVIDER_SITE_OTHER): Payer: No Typology Code available for payment source | Admitting: Cardiology

## 2023-10-08 ENCOUNTER — Encounter: Payer: Self-pay | Admitting: Cardiology

## 2023-10-08 VITALS — BP 118/72 | HR 78 | Ht 60.0 in | Wt 110.2 lb

## 2023-10-08 DIAGNOSIS — F01B4 Vascular dementia, moderate, with anxiety: Secondary | ICD-10-CM

## 2023-10-08 DIAGNOSIS — I10 Essential (primary) hypertension: Secondary | ICD-10-CM | POA: Diagnosis not present

## 2023-10-08 DIAGNOSIS — F03918 Unspecified dementia, unspecified severity, with other behavioral disturbance: Secondary | ICD-10-CM

## 2023-10-08 NOTE — Progress Notes (Signed)
Established Patient Office Visit  Subjective:  Patient ID: Amanda Hardin, female    DOB: 02/23/1956  Age: 67 y.o. MRN: [id number]  Chief Complaint  Patient presents with   Follow-up    Left hip pain, been going on for a while    Patient in office for 2 month follow up. Accompanied by cousin today. Patient is now living in [place], near a cousin. Patient reports feeling well today, no complaints.  Records release form to be signed today for transfer of care.     No other concerns at this time.   Past Medical History:  Diagnosis Date   Anxiety    Arthritis    Complication of anesthesia    Difficulty waking up.   Cough    Depression    GERD (gastroesophageal reflux disease)    Hyperlipidemia    Hypertension    Thyroid nodule    Torn meniscus    R     Past Surgical History:  Procedure Laterality Date   BREAST BIOPSY Left 09/2015   [place] core bx  NEG    BREAST BIOPSY Right    both core and excisional done, multiples for fibroadenomas   BREAST BIOPSY Right 10/26/2020   stereo biopsy, clip, [facility]   BREAST EXCISIONAL BIOPSY Left 2014   fibroadenomas   BREAST LUMPECTOMY Right 12/07/2020   [facility]   BREAST LUMPECTOMY WITH NEEDLE LOCALIZATION AND AXILLARY SENTINEL LYMPH NODE BX Right 12/07/2020   Procedure: BREAST LUMPECTOMY WITH NEEDLE LOCALIZATION AND AXILLARY SENTINEL LYMPH NODE BX;  Surgeon: Earline Mayotte, MD;  Location: [facility] ORS;  Service: General;  Laterality: Right;   CESAREAN SECTION     CYSTOSCOPY N/A 07/09/2018   Procedure: CYSTOSCOPY;  Surgeon: Conard Novak, MD;  Location: [facility] ORS;  Service: Gynecology;  Laterality: N/A;   ROBOTIC ASSISTED LAPAROSCOPIC OVARIAN CYSTECTOMY Bilateral 07/09/2018   Procedure: ROBOTIC ASSISTED LAPAROSCOPIC OVARIAN CYSTECTOMY;  Surgeon: Conard Novak, MD;  Location: [facility] ORS;  Service: Gynecology;  Laterality: Bilateral;   ROBOTIC ASSISTED TOTAL HYSTERECTOMY WITH BILATERAL SALPINGO OOPHERECTOMY Bilateral 07/09/2018    Procedure: ROBOTIC ASSISTED TOTAL HYSTERECTOMY WITH BILATERAL SALPINGO OOPHORECTOMY;  Surgeon: Conard Novak, MD;  Location: [facility] ORS;  Service: Gynecology;  Laterality: Bilateral;   TONSILLECTOMY      Social History   Socioeconomic History   Marital status: Single    Spouse name: Not on file   Number of children: Not on file   Years of education: Not on file   Highest education level: Not on file  Occupational History   Not on file  Tobacco Use   Smoking status: Former    Current packs/day: 0.00    Types: Cigarettes    Start date: 03/19/1977    Quit date: 03/19/2002    Years since quitting: 21.5   Smokeless tobacco: Never  Vaping Use   Vaping status: Never Used  Substance and Sexual Activity   Alcohol use: No   Drug use: No   Sexual activity: Not Currently    Birth control/protection: Post-menopausal  Other Topics Concern   Not on file  Social History Narrative   Not on file   Social Determinants of Health   Financial Resource Strain: Patient Declined (12/14/2022)   Received from [facility], [facility]   Overall Financial Resource Strain (CARDIA)    Difficulty of Paying Living Expenses: Patient declined  Food Insecurity: Patient Declined (12/14/2022)   Received from [facility], [facility]   Hunger Vital Sign    Worried About Running Out of Food in the Last Year: Patient declined    Ran Out of Food in the Last Year: Patient declined  Transportation Needs: Not on file  Physical Activity: Unknown (06/10/2018)   Exercise Vital Sign    Days of Exercise per Week: 7 days    Minutes of Exercise per Session: Not on file  Stress: Not on file  Social Connections: Not on file  Intimate Partner Violence: Not on file    Family History  Problem Relation Age of Onset   Diabetes Maternal Aunt    Hypertension Maternal Aunt    Prostate cancer Father    Heart failure Father    Pancreatic cancer  Paternal Aunt    Breast cancer Neg Hx    Bladder Cancer Neg Hx    Kidney cancer Neg Hx     Allergies  Allergen Reactions   Penicillins Other (See Comments)    Allergic reaction happened during childhood- not recalled   Norco [Hydrocodone-Acetaminophen] Nausea And Vomiting   Silicone Rash   Tape Rash    Review of Systems  Constitutional: Negative.   HENT: Negative.    Eyes: Negative.   Respiratory: Negative.  Negative for shortness of breath.   Cardiovascular: Negative.  Negative for chest pain.  Gastrointestinal: Negative.  Negative for abdominal pain, constipation and diarrhea.  Genitourinary: Negative.   Musculoskeletal:  Negative for joint pain and myalgias.  Skin: Negative.   Neurological: Negative.  Negative for dizziness and headaches.  Endo/Heme/Allergies: Negative.   All other systems reviewed and are negative.      Objective:   BP 118/72   Pulse 78   Ht 5' (1.524 m)   Wt 110 lb 3.2 oz (50 kg)   SpO2 99%   BMI 21.52 kg/m   Vitals:   10/08/23 1349  BP: 118/72  Pulse: 78  Height: 5' (1.524 m)  Weight: 110 lb 3.2 oz (50 kg)  SpO2: 99%  BMI (Calculated): 21.52    Physical Exam Vitals and nursing note reviewed.  Constitutional:      Appearance: Normal appearance. She is normal weight.  HENT:     Head: Normocephalic and atraumatic.     Nose: Nose normal.     Mouth/Throat:     Mouth: Mucous membranes are moist.  Eyes:     Extraocular Movements: Extraocular movements intact.     Conjunctiva/sclera: Conjunctivae normal.     Pupils: Pupils are equal, round, and reactive to light.  Cardiovascular:     Rate and Rhythm: Normal rate and regular rhythm.     Pulses: Normal pulses.     Heart sounds: Normal heart sounds.  Pulmonary:     Effort: Pulmonary effort is normal.     Breath sounds: Normal breath sounds.  Abdominal:     General: Abdomen is flat. Bowel sounds are normal.     Palpations: Abdomen is soft.  Musculoskeletal:        General: Normal  range of motion.     Cervical back: Normal range of motion.  Skin:    General: Skin is warm and dry.  Neurological:     General: No focal deficit present.     Mental Status: She is alert and oriented to person, place, and time.  Psychiatric:        Mood and Affect: Mood normal.        Behavior: Behavior normal.  Thought Content: Thought content normal.        Judgment: Judgment normal.      No results found for any visits on 10/08/23.  Recent Results (from the past 2160 hour(s))  Basic metabolic panel     Status: Abnormal   Collection Time: 08/08/23  2:44 PM  Result Value Ref Range   Glucose 88 70 - 99 mg/dL   BUN 22 8 - 27 mg/dL   Creatinine, Ser 1.61 0.57 - 1.00 mg/dL   eGFR 69 >09 UE/AVW/0.98   BUN/Creatinine Ratio 24 12 - 28   Sodium 146 (H) 134 - 144 mmol/L   Potassium 4.1 3.5 - 5.2 mmol/L   Chloride 104 96 - 106 mmol/L   CO2 27 20 - 29 mmol/L   Calcium 9.5 8.7 - 10.3 mg/dL      Assessment & Plan:  Follow up as needed  Problem List Items Addressed This Visit       Cardiovascular and Mediastinum   Essential hypertension, benign - Primary     Nervous and Auditory   Moderate vascular dementia with anxiety (HCC)   Dementia with behavioral disturbance (HCC)    Return if symptoms worsen or fail to improve.   Total time spent: 25 minutes  [employer], NP  10/08/2023   This document may have been prepared by Dragon Voice Recognition software and as such may include unintentional dictation errors.

## 2024-01-04 ENCOUNTER — Other Ambulatory Visit: Payer: Self-pay | Admitting: Oncology

## 2024-01-15 ENCOUNTER — Other Ambulatory Visit: Payer: Self-pay | Admitting: Internal Medicine

## 2024-01-29 ENCOUNTER — Other Ambulatory Visit: Payer: Self-pay | Admitting: *Deleted

## 2024-01-29 ENCOUNTER — Other Ambulatory Visit: Payer: Self-pay | Admitting: Oncology

## 2024-01-29 DIAGNOSIS — Z170 Estrogen receptor positive status [ER+]: Secondary | ICD-10-CM

## 2024-01-29 DIAGNOSIS — C50311 Malignant neoplasm of lower-inner quadrant of right female breast: Secondary | ICD-10-CM

## 2024-02-07 ENCOUNTER — Telehealth: Payer: Self-pay | Admitting: Internal Medicine

## 2024-02-07 NOTE — Telephone Encounter (Signed)
Mrs. Renato Gails, with [facility] and [employer], left VM wanting to speak with someone about this patient's last visit with Dr. Welton Flakes before she moved to [place].  Callback phone numbers are [phone] or [phone]

## 2024-02-17 ENCOUNTER — Ambulatory Visit: Payer: No Typology Code available for payment source | Admitting: Oncology

## 2024-03-12 ENCOUNTER — Other Ambulatory Visit: Payer: No Typology Code available for payment source

## 2024-03-17 ENCOUNTER — Ambulatory Visit: Payer: No Typology Code available for payment source | Admitting: Oncology

## 2024-05-11 ENCOUNTER — Other Ambulatory Visit: Payer: Self-pay | Admitting: Internal Medicine

## 2024-05-11 DIAGNOSIS — I10 Essential (primary) hypertension: Secondary | ICD-10-CM
